# Patient Record
Sex: Male | Born: 1960
Health system: Southern US, Community
[De-identification: ages and names within clinical notes are randomized; demographics above are authoritative.]

## PROBLEM LIST (undated history)

## (undated) DIAGNOSIS — N200 Calculus of kidney: Secondary | ICD-10-CM

## (undated) DIAGNOSIS — I10 Essential (primary) hypertension: Secondary | ICD-10-CM

## (undated) DIAGNOSIS — E785 Hyperlipidemia, unspecified: Secondary | ICD-10-CM

## (undated) DIAGNOSIS — G4730 Sleep apnea, unspecified: Secondary | ICD-10-CM

## (undated) DIAGNOSIS — I4891 Unspecified atrial fibrillation: Secondary | ICD-10-CM

## (undated) HISTORY — DX: Hyperlipidemia, unspecified: E78.5

## (undated) HISTORY — DX: Essential (primary) hypertension: I10

## (undated) HISTORY — PX: COLONOSCOPY: SHX174

## (undated) HISTORY — PX: CARDIOVERSION: SHX1299

## (undated) HISTORY — DX: Sleep apnea, unspecified: G47.30

## (undated) HISTORY — DX: Unspecified atrial fibrillation: I48.91

## (undated) HISTORY — DX: Calculus of kidney: N20.0

---

## 1996-02-26 ENCOUNTER — Encounter: Payer: Self-pay | Admitting: Internal Medicine

## 1996-03-03 DIAGNOSIS — I10 Essential (primary) hypertension: Secondary | ICD-10-CM

## 1996-03-03 HISTORY — DX: Essential (primary) hypertension: I10

## 1997-06-11 ENCOUNTER — Encounter: Payer: Self-pay | Admitting: Internal Medicine

## 2005-11-03 ENCOUNTER — Ambulatory Visit: Payer: Self-pay | Admitting: Internal Medicine

## 2007-12-31 ENCOUNTER — Ambulatory Visit: Payer: Self-pay | Admitting: Family Medicine

## 2007-12-31 DIAGNOSIS — E7800 Pure hypercholesterolemia, unspecified: Secondary | ICD-10-CM | POA: Insufficient documentation

## 2007-12-31 DIAGNOSIS — I10 Essential (primary) hypertension: Secondary | ICD-10-CM | POA: Insufficient documentation

## 2008-01-01 LAB — CONVERTED CEMR LAB
BUN: 14 mg/dL (ref 6–23)
Cholesterol: 196 mg/dL (ref 0–200)
Creatinine, Ser: 1.1 mg/dL (ref 0.4–1.5)
GFR calc Af Amer: 93 mL/min
GFR calc non Af Amer: 77 mL/min
Glucose, Bld: 88 mg/dL (ref 70–99)
LDL Cholesterol: 139 mg/dL — ABNORMAL HIGH (ref 0–99)
Triglycerides: 89 mg/dL (ref 0–149)
VLDL: 18 mg/dL (ref 0–40)

## 2008-03-13 ENCOUNTER — Ambulatory Visit: Payer: Self-pay | Admitting: Internal Medicine

## 2008-05-19 ENCOUNTER — Encounter: Payer: Self-pay | Admitting: Internal Medicine

## 2008-09-16 ENCOUNTER — Telehealth: Payer: Self-pay | Admitting: Internal Medicine

## 2008-10-07 ENCOUNTER — Ambulatory Visit: Payer: Self-pay | Admitting: Internal Medicine

## 2009-01-22 ENCOUNTER — Ambulatory Visit: Payer: Self-pay | Admitting: Internal Medicine

## 2009-01-22 DIAGNOSIS — I4891 Unspecified atrial fibrillation: Secondary | ICD-10-CM

## 2009-01-22 DIAGNOSIS — I480 Paroxysmal atrial fibrillation: Secondary | ICD-10-CM | POA: Insufficient documentation

## 2009-02-02 ENCOUNTER — Ambulatory Visit: Payer: Self-pay

## 2009-02-02 ENCOUNTER — Encounter: Payer: Self-pay | Admitting: Internal Medicine

## 2009-02-08 ENCOUNTER — Encounter (INDEPENDENT_AMBULATORY_CARE_PROVIDER_SITE_OTHER): Payer: Self-pay | Admitting: *Deleted

## 2009-02-08 ENCOUNTER — Ambulatory Visit: Payer: Self-pay | Admitting: Internal Medicine

## 2009-02-11 ENCOUNTER — Ambulatory Visit: Payer: Self-pay | Admitting: Cardiovascular Disease

## 2009-02-15 ENCOUNTER — Ambulatory Visit: Payer: Self-pay | Admitting: Internal Medicine

## 2009-02-17 ENCOUNTER — Ambulatory Visit: Payer: Self-pay | Admitting: Internal Medicine

## 2009-02-17 ENCOUNTER — Encounter: Payer: Self-pay | Admitting: Internal Medicine

## 2009-02-17 ENCOUNTER — Ambulatory Visit (HOSPITAL_COMMUNITY): Admission: RE | Admit: 2009-02-17 | Discharge: 2009-02-17 | Payer: Self-pay | Admitting: Internal Medicine

## 2009-02-19 ENCOUNTER — Ambulatory Visit: Payer: Self-pay | Admitting: Internal Medicine

## 2009-02-19 ENCOUNTER — Encounter: Payer: Self-pay | Admitting: Internal Medicine

## 2009-02-26 ENCOUNTER — Ambulatory Visit: Payer: Self-pay | Admitting: Internal Medicine

## 2009-03-05 ENCOUNTER — Ambulatory Visit: Payer: Self-pay | Admitting: Cardiovascular Disease

## 2009-03-05 LAB — CONVERTED CEMR LAB
POC INR: 2.3
Prothrombin Time: 18.7 s

## 2009-03-11 ENCOUNTER — Ambulatory Visit: Payer: Self-pay | Admitting: Internal Medicine

## 2009-03-11 LAB — CONVERTED CEMR LAB: POC INR: 2.4

## 2009-03-12 ENCOUNTER — Encounter: Payer: Self-pay | Admitting: Internal Medicine

## 2009-03-16 ENCOUNTER — Telehealth: Payer: Self-pay | Admitting: Internal Medicine

## 2009-03-18 ENCOUNTER — Ambulatory Visit: Payer: Self-pay | Admitting: Cardiology

## 2009-03-18 LAB — CONVERTED CEMR LAB: POC INR: 2.8

## 2009-03-19 ENCOUNTER — Ambulatory Visit (HOSPITAL_COMMUNITY): Admission: RE | Admit: 2009-03-19 | Discharge: 2009-03-19 | Payer: Self-pay | Admitting: Internal Medicine

## 2009-03-19 ENCOUNTER — Telehealth: Payer: Self-pay | Admitting: Internal Medicine

## 2009-04-24 ENCOUNTER — Encounter: Payer: Self-pay | Admitting: Internal Medicine

## 2009-04-27 ENCOUNTER — Ambulatory Visit: Payer: Self-pay

## 2009-04-27 ENCOUNTER — Encounter: Payer: Self-pay | Admitting: Internal Medicine

## 2009-05-03 ENCOUNTER — Ambulatory Visit: Payer: Self-pay | Admitting: Internal Medicine

## 2009-05-19 ENCOUNTER — Telehealth (INDEPENDENT_AMBULATORY_CARE_PROVIDER_SITE_OTHER): Payer: Self-pay | Admitting: *Deleted

## 2009-06-03 ENCOUNTER — Telehealth: Payer: Self-pay | Admitting: Internal Medicine

## 2009-06-23 ENCOUNTER — Ambulatory Visit: Payer: Self-pay | Admitting: Internal Medicine

## 2009-06-24 LAB — CONVERTED CEMR LAB
Calcium: 9.6 mg/dL (ref 8.4–10.5)
Sodium: 140 meq/L (ref 135–145)

## 2009-08-12 ENCOUNTER — Telehealth: Payer: Self-pay | Admitting: Internal Medicine

## 2009-10-20 ENCOUNTER — Telehealth: Payer: Self-pay | Admitting: Internal Medicine

## 2009-12-24 ENCOUNTER — Telehealth: Payer: Self-pay | Admitting: Internal Medicine

## 2010-01-07 ENCOUNTER — Ambulatory Visit: Payer: Self-pay | Admitting: Internal Medicine

## 2010-01-07 DIAGNOSIS — R609 Edema, unspecified: Secondary | ICD-10-CM | POA: Insufficient documentation

## 2010-07-31 LAB — CONVERTED CEMR LAB
ALT: 22 units/L (ref 0–53)
Basophils Absolute: 0 10*3/uL (ref 0.0–0.1)
Basophils Relative: 0 % (ref 0–1)
CO2: 21 meq/L (ref 19–32)
Calcium: 9.4 mg/dL (ref 8.4–10.5)
Chloride: 107 meq/L (ref 96–112)
Eosinophils Relative: 1 % (ref 0–5)
HCT: 45.3 % (ref 39.0–52.0)
Lymphocytes Relative: 31 % (ref 12–46)
MCHC: 34.9 g/dL (ref 30.0–36.0)
Platelets: 177 10*3/uL (ref 150–400)
RDW: 12.4 % (ref 11.5–15.5)
Sodium: 143 meq/L (ref 135–145)
TSH: 3.167 microintl units/mL (ref 0.350–4.500)
Total Protein: 7 g/dL (ref 6.0–8.3)

## 2010-08-03 NOTE — Progress Notes (Signed)
Summary: REFILL Triamterene-HCTZ and Benazepril    Phone Note Refill Request   Refills Requested: Medication #1:  TRIAMTERENE-HCTZ 37.5-25 MG  TABS take 1 tablet by mouth once daily  Medication #2:  BENAZEPRIL HCL 10 MG TABS 1 by mouth once daily. [facility] (PT NEEDS A 90 DAY SUPPLY FOR INS PURPOSES)  Initial call taken by: [place] Carbo,  December 24, 2009 1:51 PM    Prescriptions: BENAZEPRIL HCL 10 MG TABS (BENAZEPRIL HCL) 1 by mouth once daily  #90 x 3   Entered by:   Bishop Dublin, CMA   Authorized by:   Nathen May, MD, [facility]   Signed by:   Bishop Dublin, CMA on 12/24/2009   Method used:   Electronically to        [facility] [phone]* (retail)       [address]       [place], [place]  [address]       Ph: [phone]       Fax: [phone]   RxID:   [id number] TRIAMTERENE-HCTZ 37.5-25 MG  TABS (TRIAMTERENE-HCTZ) take 1 tablet by mouth once daily  #90 x 3   Entered by:   Bishop Dublin, CMA   Authorized by:   Nathen May, MD, [facility]   Signed by:   Bishop Dublin, CMA on 12/24/2009   Method used:   Electronically to        [facility] [phone]* (retail)       [address]       [place], [place]  [address]       Ph: [phone]       Fax: [phone]   RxID:   [id number]

## 2010-08-03 NOTE — Progress Notes (Signed)
Summary: please advise   Phone Note Call from Patient   Caller: Patient Call For: Dr Graciela Husbands Summary of Call: Elvina Sidle Dr Graciela Husbands, Mr Austad called about his [employer]. He just wanted to check and see if your last office note would verify that his issue is ok. He just wanted to check with you before he sent it in. Please advise. Thanks Cala Bradford :) Initial call taken by: Mercer Pod,  August 12, 2009 3:39 PM  Follow-up for Phone Call        spoke with Graciela Husbands, he thinks the last office note should be good enough for her life insurance.    02/15 left message with daughter to call back with this message.Marland Kitchen  02/15 will fax last office note to pt at his office. He will let us know if he needs anything else.  Follow-up by: Mercer Pod,  August 17, 2009 11:47 AM

## 2010-08-03 NOTE — Assessment & Plan Note (Signed)
Summary: F/U 6 MONTHS      Allergies Added:   Visit Type:  Follow-up Primary Provider:  Cindee Salt MD  CC:  no complaints.  History of Present Illness: Jose Garcia is seen in followup for atrial fibrillation-paroxysmal for which he was cardioverted. He also has a history of bradycardia. A transient cardiomyopathy was identified by ultrasound with subsequent normalization by echo last fall  his blood pressures at home run 130 over the 90s;  his diet is quite replete with  sodium.  Current Medications (verified): 1)  Triamterene-Hctz 37.5-25 Mg  Tabs (Triamterene-Hctz) .... Take 1 Tablet By Mouth Once Daily 2)  Aspirin 81 Mg Tbec (Aspirin) .... Take 2 Tablets By Mouth Daily 3)  Benazepril Hcl 10 Mg Tabs (Benazepril Hcl) .Marland Kitchen.. 1 By Mouth Once Daily  Allergies (verified): 1)  ! * Shellfish  Past History:  Past Medical History: Last updated: 05/03/2009 Hypertension (03/03/1996) Hyperlipidemia Atrial fibrillation Cardiomyopathy-resolved 05/03/09  Past Surgical History: Last updated: 10/07/2008 Denies surgical history  Vital Signs:  Patient profile:   50 year old male Height:      72 inches Weight:      278 pounds BMI:     37.84 Pulse rate:   91 / minute BP sitting:   156 / 93  (left arm) Cuff size:   regular  Vitals Entered By: Hardin Negus, RMA (January 07, 2010 2:08 PM)  Physical Exam  General:  The patient was alert and oriented in no acute distress. HEENT Normal.  Neck veins were flat, carotids were brisk.  Lungs were clear.  Heart sounds were regular without murmurs or gallops.  Abdomen was soft with active bowel sounds. There is no clubbing cyanosis ; trace bilateral edema Skin Warm and dry engaging affect    Impression & Recommendations:  Problem # 1:  ATRIAL FIBRILLATION (ICD-427.31)  atrial fibrillation is paroxysmal. He had no clinical recurrences. We'll continue him on his aspirin with his CHADS score of 1/CHADS VASC score of one. His updated  medication list for this problem includes:    Aspirin 81 Mg Tbec (Aspirin) .Marland Kitchen... Take 2 tablets by mouth daily  His updated medication list for this problem includes:    Aspirin 81 Mg Tbec (Aspirin) .Marland Kitchen... Take 2 tablets by mouth daily  Problem # 2:  HYPERTENSION (ICD-401.9)  Is on double therapy including a diuretic; check his metabolic profile today.  we have also discussed the importance of sodium and the need to try to reduce his intake. We discussed the use of salt substitutes and potential benefits of potassium related to blood pressure His updated medication list for this problem includes:    Triamterene-hctz 37.5-25 Mg Tabs (Triamterene-hctz) .Marland Kitchen... Take 1 tablet by mouth once daily    Aspirin 81 Mg Tbec (Aspirin) .Marland Kitchen... Take 2 tablets by mouth daily    Benazepril Hcl 10 Mg Tabs (Benazepril hcl) .Marland Kitchen... 1 by mouth once daily  His updated medication list for this problem includes:    Triamterene-hctz 37.5-25 Mg Tabs (Triamterene-hctz) .Marland Kitchen... Take 1 tablet by mouth once daily    Aspirin 81 Mg Tbec (Aspirin) .Marland Kitchen... Take 2 tablets by mouth daily    Benazepril Hcl 10 Mg Tabs (Benazepril hcl) .Marland Kitchen... 1 by mouth once daily  Problem # 3:  SINUS BRADYCARDIA (ICD-427.81)  His heart rate is surprisingly rapid today. Will check a thyroid and hemoglobin given the relative sinus "tachycardia". His updated medication list for this problem includes:    Aspirin 81 Mg Tbec (Aspirin) .Marland KitchenMarland KitchenMarland KitchenMarland Kitchen  Take 2 tablets by mouth daily    Benazepril Hcl 10 Mg Tabs (Benazepril hcl) .Marland Kitchen... 1 by mouth once daily  His updated medication list for this problem includes:    Aspirin 81 Mg Tbec (Aspirin) .Marland Kitchen... Take 2 tablets by mouth daily    Benazepril Hcl 10 Mg Tabs (Benazepril hcl) .Marland Kitchen... 1 by mouth once daily  Other Orders: T-Basic Metabolic Panel [phone]) T-TSH [phone]) T-CBC w/Diff [phone])  Patient Instructions: 1)  Your physician recommends that you schedule a follow-up appointment in: 12 months 2)   We'll check metabolic profile TSH and CBC. 3)  He is advised to use a salt substitute

## 2010-08-03 NOTE — Progress Notes (Signed)
Summary: RX   Phone Note Refill Request Call back at Home Phone [phone] Message from:  SELF on October 20, 2009 4:30 PM  Refills Requested: Medication #1:  TRIAMTERENE-HCTZ 37.5-25 MG  TABS take 1 tablet by mouth once daily [facility]  Initial call taken by: Harlon Flor,  October 20, 2009 4:30 PM Caller: SELF    Prescriptions: TRIAMTERENE-HCTZ 37.5-25 MG  TABS (TRIAMTERENE-HCTZ) take 1 tablet by mouth once daily  #90 x 3   Entered by:   Mercer Pod   Authorized by:   Nathen May, MD, [facility]   Signed by:   Mercer Pod on 10/21/2009   Method used:   [facility]ically [facility]        [facility]  [address] [phone][facility] (retail)       [address]       [place], [place]  [address]       Ph: [phone]       Fax: [phone]   RxID:   [id number]

## 2010-10-07 LAB — BASIC METABOLIC PANEL
BUN: 15 mg/dL (ref 6–23)
CO2: 27 mEq/L (ref 19–32)
Calcium: 8.5 mg/dL (ref 8.4–10.5)
Chloride: 106 mEq/L (ref 96–112)
Creatinine, Ser: 1.01 mg/dL (ref 0.4–1.5)
GFR calc Af Amer: >60 mL/min (ref >60)
GFR calc non Af Amer: >60 mL/min (ref >60)
Glucose, Bld: 116 mg/dL — ABNORMAL HIGH (ref 70–99)
Potassium: 4.3 mEq/L (ref 3.5–5.1)
Sodium: 139 mEq/L (ref 135–145)

## 2010-10-07 LAB — PROTIME-INR: Prothrombin Time: 23.6 seconds — ABNORMAL HIGH (ref 11.6–15.2)

## 2010-10-07 LAB — CBC
HCT: 42.4 % (ref 39.0–52.0)
Platelets: 120 10*3/uL — ABNORMAL LOW (ref 150–400)
RBC: 4.55 MIL/uL (ref 4.22–5.81)
WBC: 4.4 10*3/uL (ref 4.0–10.5)

## 2010-10-08 LAB — PROTIME-INR: Prothrombin Time: 19.9 seconds — ABNORMAL HIGH (ref 11.6–15.2)

## 2010-12-24 ENCOUNTER — Other Ambulatory Visit: Payer: Self-pay | Admitting: Internal Medicine

## 2011-01-19 ENCOUNTER — Encounter: Payer: Self-pay | Admitting: Internal Medicine

## 2011-12-12 ENCOUNTER — Other Ambulatory Visit: Payer: Self-pay | Admitting: *Deleted

## 2011-12-12 MED ORDER — TRIAMTERENE-HCTZ 37.5-25 MG PO TABS: 1.0000 | ORAL_TABLET | Freq: Every day | ORAL | Status: DC

## 2011-12-12 MED ORDER — BENAZEPRIL HCL 10 MG PO TABS: 10.0000 mg | ORAL_TABLET | Freq: Every day | ORAL | Status: DC

## 2012-02-23 ENCOUNTER — Ambulatory Visit: Payer: Self-pay | Admitting: Gastroenterology

## 2013-08-05 ENCOUNTER — Telehealth: Payer: Self-pay

## 2013-08-05 ENCOUNTER — Encounter: Payer: Self-pay | Admitting: Internal Medicine

## 2013-08-05 ENCOUNTER — Ambulatory Visit (INDEPENDENT_AMBULATORY_CARE_PROVIDER_SITE_OTHER): Payer: BC Managed Care – PPO | Admitting: Internal Medicine

## 2013-08-05 VITALS — BP 128/80 | HR 170 | Ht 72.0 in | Wt 295.5 lb

## 2013-08-05 DIAGNOSIS — R000 Tachycardia, unspecified: Secondary | ICD-10-CM

## 2013-08-05 DIAGNOSIS — I4891 Unspecified atrial fibrillation: Secondary | ICD-10-CM

## 2013-08-05 MED ORDER — APIXABAN 5 MG PO TABS: 5.0000 mg | ORAL_TABLET | Freq: Two times a day (BID) | ORAL | Status: DC

## 2013-08-05 MED ORDER — METOPROLOL SUCCINATE ER 50 MG PO TB24: 50.0000 mg | ORAL_TABLET | Freq: Every day | ORAL | Status: DC

## 2013-08-05 NOTE — Progress Notes (Signed)
      Patient Care Team: Fidel LevyJr. [name], MD as PCP - General (Family Medicine)   HPI  Jose MantisDavid B Garcia is a 53 y.o. male Is seen as an add-on today. He was last seen 3-1/2 years ago He has a history of paroxysmal atrial fibrillation associated with a transient cardiomyopathy.he has a history of treated hypertension; he takes aspirin with a CHADS-VASc score of one  He comes in today with palpitations which she became aware of last evening but is not sure that's when he started  The patient denies chest pain, shortness of breath, nocturnal dyspnea, orthopnea or peripheral edema.  There have been no palpitations, lightheadedness or syncope.     Past Medical History  Diagnosis Date  . Hypertension 03/03/96  . Hyperlipidemia   . Atrial fibrillation   . Cardiomyopathy     Resolved 05/03/09    History reviewed. No pertinent past surgical history.  Current Outpatient Prescriptions  Medication Sig Dispense Refill  . aspirin 81 MG EC tablet Take 81 mg by mouth daily.       . benazepril (LOTENSIN) 20 MG tablet Take 20 mg by mouth daily.      . Cranberry-Vitamin C-Probiotic (AZO CRANBERRY) 250-30 MG TABS Take by mouth daily.      . Omega-3 Fatty Acids (FISH OIL) 1000 MG CAPS Take by mouth daily.      Marland Kitchen. triamterene-hydrochlorothiazide (MAXZIDE-25) 37.5-25 MG per tablet Take 1 each (1 tablet total) by mouth daily.  90 tablet  1   No current facility-administered medications for this visit.    No Known Allergies  Review of Systems negative except from HPI and PMH  Physical Exam BP 128/80  Pulse 170  Ht 6' (1.829 m)  Wt 295 lb 8 oz (134.038 kg)  BMI 40.07 kg/m2 Well developed and well nourished in no acute distress HENT normal E scleral and icterus clear Neck Supple JVP flat; carotids brisk and full Clear to ausculation  Rapid and irregular rate and rhythm with no murmurs Soft with active bowel sounds No clubbing cyanosis 1+ Edema Alert and oriented, grossly normal motor and  sensory function Skin Warm and Dry  ECG demonstrates atrial fibrillation with a rapid ventricular response at 150 beats per minute  Assessment and  Plan

## 2013-08-05 NOTE — Telephone Encounter (Signed)
Pt called and states he is having a "racing and eratic" heartbeat. Would like for someone to call him.

## 2013-08-05 NOTE — Assessment & Plan Note (Signed)
The patient has recurrent atrial fibrillation of presumed but not clearly a brief duration. Because of that, we'll need to anticoagulate and undertake TEE guided cardioversion. We'll begin him on rate control with metoprolol 50 mg a day. We will continue him on apixaban for 4 weeks following as his CHADS-VASc score is only 1 enhances not need long-term anticoagulation. We will hold his aspirin the interim.  Given his history of previously identified tachycardia-induced cardiomyopathy with subsequent resolution,t

## 2013-08-05 NOTE — Telephone Encounter (Signed)
Last night patient felt like he had a flutter. Heart is racing and rate is up and down. He is not sure if the machine is correct but it says his rate is 150. He says he feels fine other than being nervous that he is in afib again. He will check manually when he gets to work (he was in the car almost at work when he called). I informed patient that I would discuss this with Dr. Graciela HusbandsKlein as soon as he arrived and call him back for a possible visit today. Patient instructed to notify EMS if he felt that it became emergent or became symptomatic.

## 2013-08-05 NOTE — Addendum Note (Signed)
Addended by: Fransico SettersBAUCOM, Raphael Fitzpatrick E on: 08/05/2013 01:51 PM   Modules accepted: Orders

## 2013-08-05 NOTE — Telephone Encounter (Signed)
Appt made with Dr. Graciela HusbandsKlein for today

## 2013-08-05 NOTE — Patient Instructions (Addendum)
Your physician has requested that you have a TEE Friday 07/08/13 at 06:30 am . During a TEE, sound waves are used to create images of your heart. It provides your doctor with information about the size and shape of your heart and how well your heart's chambers and valves are working. In this test, a transducer is attached to the end of a flexible tube that's guided down your throat and into your esophagus (the tube leading from you mouth to your stomach) to get a more detailed image of your heart. You are not awake for the procedure. Please see the instruction sheet given to you today. For further information please visit [email or web address].   Your physician has recommended you make the following change in your medication:  Start Eliquis 5 mg twice a day Start Metoprolol 50 mg daily

## 2013-08-06 LAB — CBC WITH DIFFERENTIAL
BASOS ABS: 0 10*3/uL (ref 0.0–0.2)
Basos: 0 %
EOS: 1 %
Eosinophils Absolute: 0 10*3/uL (ref 0.0–0.4)
HCT: 48 % (ref 37.5–51.0)
Hemoglobin: 16.4 g/dL (ref 12.6–17.7)
IMMATURE GRANS (ABS): 0 10*3/uL (ref 0.0–0.1)
IMMATURE GRANULOCYTES: 0 %
LYMPHS: 23 %
Lymphocytes Absolute: 1.6 10*3/uL (ref 0.7–3.1)
MCH: 31.5 pg (ref 26.6–33.0)
MCHC: 34.2 g/dL (ref 31.5–35.7)
MCV: 92 fL (ref 79–97)
MONOCYTES: 7 %
Monocytes Absolute: 0.5 10*3/uL (ref 0.1–0.9)
Neutrophils Absolute: 4.8 10*3/uL (ref 1.4–7.0)
Neutrophils Relative %: 69 %
PLATELETS: 240 10*3/uL (ref 150–379)
RBC: 5.2 x10E6/uL (ref 4.14–5.80)
RDW: 13.2 % (ref 12.3–15.4)
WBC: 6.9 10*3/uL (ref 3.4–10.8)

## 2013-08-06 LAB — PROTIME-INR
INR: 1 (ref 0.8–1.2)
PROTHROMBIN TIME: 10.6 s (ref 9.1–12.0)

## 2013-08-06 LAB — BASIC METABOLIC PANEL
BUN/Creatinine Ratio: 15 (ref 9–20)
BUN: 13 mg/dL (ref 6–24)
CALCIUM: 9.3 mg/dL (ref 8.7–10.2)
CHLORIDE: 103 mmol/L (ref 97–108)
CO2: 19 mmol/L (ref 18–29)
Creatinine, Ser: 0.86 mg/dL (ref 0.76–1.27)
GFR calc Af Amer: 115 mL/min/{1.73_m2} (ref >59)
GFR, EST NON AFRICAN AMERICAN: 100 mL/min/{1.73_m2} (ref >59)
GLUCOSE: 119 mg/dL — AB (ref 65–99)
POTASSIUM: 4.2 mmol/L (ref 3.5–5.2)
Sodium: 142 mmol/L (ref 134–144)

## 2013-08-08 ENCOUNTER — Encounter: Payer: Self-pay | Admitting: Internal Medicine

## 2013-08-08 ENCOUNTER — Ambulatory Visit: Payer: Self-pay | Admitting: Internal Medicine

## 2013-08-08 DIAGNOSIS — I4891 Unspecified atrial fibrillation: Secondary | ICD-10-CM

## 2013-08-08 DIAGNOSIS — I059 Rheumatic mitral valve disease, unspecified: Secondary | ICD-10-CM

## 2013-08-20 ENCOUNTER — Encounter: Payer: BC Managed Care – PPO | Admitting: Internal Medicine

## 2013-08-29 ENCOUNTER — Telehealth: Payer: Self-pay

## 2013-08-29 NOTE — Telephone Encounter (Signed)
Pt called and states he has a question about his medication. Please call.

## 2013-08-29 NOTE — Telephone Encounter (Signed)
Per Dr Graciela HusbandsKlein have patient come in at 0800 on Tuesday 11/01/13. Patient verbalized understanding.

## 2013-08-29 NOTE — Telephone Encounter (Signed)
Patient states his cardioversion follow up appt got pushed back because of the bad weather. He wants to make sure he is still supposed to be on Metoprolol and did not want to wait until April to find out he could have stopped it. He also wanted to know if there was a more affordable medication than Eliquis or if he could stop it all together. Dr. Kirke CorinArida did his cardioversion. Patient said he would like to see Dr. Kirke CorinArida to discuss these options if he has an opening before Dr. Graciela HusbandsKlein if this can not be managed over the phone.

## 2013-09-02 ENCOUNTER — Ambulatory Visit (INDEPENDENT_AMBULATORY_CARE_PROVIDER_SITE_OTHER): Payer: BC Managed Care – PPO | Admitting: Internal Medicine

## 2013-09-02 ENCOUNTER — Encounter: Payer: Self-pay | Admitting: Internal Medicine

## 2013-09-02 VITALS — BP 142/83 | HR 56 | Ht 72.0 in | Wt 296.8 lb

## 2013-09-02 DIAGNOSIS — I4891 Unspecified atrial fibrillation: Secondary | ICD-10-CM

## 2013-09-02 NOTE — Progress Notes (Signed)
      Patient Care Team: Fidel LevyJr. [name], MD as PCP - General (Family Medicine)   HPI  Bari MantisDavid B Better is a 53 y.o. male Is seen as an add-on today. He was last seen 3-1/2 years ago He has a history of paroxysmal atrial fibrillation associated with a transient cardiomyopathy.he has a history of treated hypertension; he takes aspirin with a CHADS-VASc score of one  He came in early Feb with AFib >>TEEDCCV   He is much improved.  Past Medical History  Diagnosis Date  . Hypertension 03/03/96  . Hyperlipidemia   . Atrial fibrillation   . Cardiomyopathy     Resolved 05/03/09    Past Surgical History  Procedure Laterality Date  . Cardioversion      Current Outpatient Prescriptions  Medication Sig Dispense Refill  . apixaban (ELIQUIS) 5 MG TABS tablet Take 1 tablet (5 mg total) by mouth 2 (two) times daily.  60 tablet  3  . benazepril (LOTENSIN) 20 MG tablet Take 20 mg by mouth daily.      . Cranberry-Vitamin C-Probiotic (AZO CRANBERRY) 250-30 MG TABS Take by mouth daily.      . metoprolol succinate (TOPROL-XL) 50 MG 24 hr tablet Take 1 tablet (50 mg total) by mouth daily. Take with or immediately following a meal.  90 tablet  3  . Omega-3 Fatty Acids (FISH OIL) 1000 MG CAPS Take by mouth daily.      Marland Kitchen. triamterene-hydrochlorothiazide (MAXZIDE-25) 37.5-25 MG per tablet Take 1 each (1 tablet total) by mouth daily.  90 tablet  1   No current facility-administered medications for this visit.    Allergies  Allergen Reactions  . Shellfish Allergy     Review of Systems negative except from HPI and PMH  Physical Exam BP 142/83  Pulse 56  Ht 6' (1.829 m)  Wt 296 lb 12 oz (134.605 kg)  BMI 40.24 kg/m2 Well developed and nourished in no acute distress HENT normal Neck supple with JVP-flat Clear Regular rate and rhythm, no murmurs or gallops Abd-soft with active BS No Clubbing cyanosis edema Skin-warm and dry A & Oriented  Grossly normal sensory and motor  function    Assessment and  Plan Atrial fibrillation  Hypertension  He has a CHADS-VASc score of 1. Based on recent studies from your, we will discontinue his apixaban. He will take no aspirin. In the event that he has recurrent atrial fibrillation which I hope you will be able to detect by takingt his pulse will resume anticoagulation. The frequency of recurrence we'll informed decisions regarding alternative therapies

## 2013-09-02 NOTE — Patient Instructions (Addendum)
Your physician has recommended you make the following change in your medication:  Stop your Eliquis   Your physician wants you to follow-up in: 6 months. You will receive a reminder letter in the mail two months in advance. If you don't receive a letter, please call our office to schedule the follow-up appointment.

## 2013-10-02 ENCOUNTER — Encounter: Payer: BC Managed Care – PPO | Admitting: Internal Medicine

## 2014-03-03 ENCOUNTER — Encounter: Payer: BC Managed Care – PPO | Admitting: Internal Medicine

## 2014-03-31 ENCOUNTER — Encounter: Payer: Self-pay | Admitting: Internal Medicine

## 2014-03-31 ENCOUNTER — Ambulatory Visit (INDEPENDENT_AMBULATORY_CARE_PROVIDER_SITE_OTHER): Payer: BC Managed Care – PPO | Admitting: Internal Medicine

## 2014-03-31 VITALS — BP 145/89 | HR 57 | Ht 72.0 in | Wt 298.2 lb

## 2014-03-31 DIAGNOSIS — I4891 Unspecified atrial fibrillation: Secondary | ICD-10-CM

## 2014-03-31 MED ORDER — METOPROLOL SUCCINATE ER 50 MG PO TB24: 50.0000 mg | ORAL_TABLET | Freq: Every day | ORAL | Status: DC

## 2014-03-31 NOTE — Patient Instructions (Signed)
Your physician recommends that you have labs today:  BMP  Your physician wants you to follow-up in: 1 year with Dr. Graciela HusbandsKlein. You will receive a reminder letter in the mail two months in advance. If you don't receive a letter, please call our office to schedule the follow-up appointment.  Your Metoprolol has been refilled   .

## 2014-03-31 NOTE — Progress Notes (Signed)
      Patient Care Team: Jose LevyJr. [name], MD as PCP - General (Family Medicine)   HPI  Bari MantisDavid B Gargis is a 53 y.o. male Is seen as an add-on today. He was last seen 3-1/2 years ago He has a history of paroxysmal atrial fibrillation associated with a transient cardiomyopathy.he has a history of treated hypertension; he takes aspirin with a CHADS-VASc score of one  He came in early Feb with AFib >>TEEDCCV    His CHADS-VASc score is 1. He is currently not anticoagulated.  Last ejection fraction TEE 2/15 50-55% He is much improved.  Past Medical History  Diagnosis Date  . Hypertension 03/03/96  . Hyperlipidemia   . Atrial fibrillation   . Cardiomyopathy     Resolved 05/03/09    Past Surgical History  Procedure Laterality Date  . Cardioversion      Current Outpatient Prescriptions  Medication Sig Dispense Refill  . benazepril (LOTENSIN) 20 MG tablet Take 20 mg by mouth daily.      . Cranberry-Vitamin C-Probiotic (AZO CRANBERRY) 250-30 MG TABS Take by mouth daily.      . metoprolol succinate (TOPROL-XL) 50 MG 24 hr tablet Take 1 tablet (50 mg total) by mouth daily. Take with or immediately following a meal.  90 tablet  3  . Omega-3 Fatty Acids (FISH OIL) 1000 MG CAPS Take by mouth daily.      Marland Kitchen. triamterene-hydrochlorothiazide (MAXZIDE-25) 37.5-25 MG per tablet Take 1 each (1 tablet total) by mouth daily.  90 tablet  1   No current facility-administered medications for this visit.    Allergies  Allergen Reactions  . Shellfish Allergy     Review of Systems negative except from HPI and PMH  Physical Exam BP 145/89  Pulse 57  Ht 6' (1.829 m)  Wt 298 lb 4 oz (135.285 kg)  BMI 40.44 kg/m2 Well developed and nourished in no acute distress HENT normal Neck supple with JVP-flat Clear Regular rate and rhythm, no murmurs or gallops Abd-soft with active BS No Clubbing cyanosis edema Skin-warm and dry A & Oriented  Grossly normal sensory and motor function    Assessment  and  Plan Atrial fibrillation  Hypertension  Obesity  He has a CHADS-VASc score of 1.  He is not on anticoagulation. His blood pressure is mildly elevated today. I encouraged weight loss and exercise the blood pressure target of 120. We'll check his metabolic profile today on the recently introduced non potassium sparing diuretic  We will see in one year   We spent more than 50% of our >25 min visit in face to face counseling regarding the above

## 2014-04-01 LAB — BASIC METABOLIC PANEL
BUN/Creatinine Ratio: 15 (ref 9–20)
BUN: 15 mg/dL (ref 6–24)
CALCIUM: 9.3 mg/dL (ref 8.7–10.2)
CHLORIDE: 97 mmol/L (ref 97–108)
CO2: 27 mmol/L (ref 18–29)
Creatinine, Ser: 0.97 mg/dL (ref 0.76–1.27)
GFR calc Af Amer: 103 mL/min/{1.73_m2} (ref >59)
GFR calc non Af Amer: 89 mL/min/{1.73_m2} (ref >59)
Glucose: 123 mg/dL — ABNORMAL HIGH (ref 65–99)
POTASSIUM: 4.1 mmol/L (ref 3.5–5.2)
SODIUM: 139 mmol/L (ref 134–144)

## 2014-05-19 ENCOUNTER — Telehealth: Payer: Self-pay

## 2014-05-19 ENCOUNTER — Encounter: Payer: Self-pay | Admitting: Internal Medicine

## 2014-05-19 ENCOUNTER — Ambulatory Visit (INDEPENDENT_AMBULATORY_CARE_PROVIDER_SITE_OTHER): Payer: BC Managed Care – PPO | Admitting: Internal Medicine

## 2014-05-19 VITALS — BP 120/80 | HR 134 | Ht 72.0 in | Wt 297.2 lb

## 2014-05-19 DIAGNOSIS — I4891 Unspecified atrial fibrillation: Secondary | ICD-10-CM

## 2014-05-19 NOTE — Telephone Encounter (Signed)
Pt states he is in afib, states it started last night, states he is in afib now, states he did take Eliquis this morning. Please call.

## 2014-05-19 NOTE — Patient Instructions (Signed)
[facility] the [facility] have [facility]. You will be given Flecainide 300 mg x 1 dose to see if you convert back to normal rhythm on your own. If not, there will be plans to cardiovert you.  Please do not eat or drink anything after midnight tonight, but you may take your medications in the morning with enough water to get them down safely.

## 2014-05-19 NOTE — Telephone Encounter (Signed)
Patient states he is in Afib  Heart rate between 100-120 He "does not feel well and is very tired" Patient scheduled to see Dr. Graciela HusbandsKlein today  Patient advised to contact EMS if he feels his situation becomes emergent before then

## 2014-05-19 NOTE — Progress Notes (Signed)
      Patient Care Team: Fidel LevyJr. [name], MD as PCP - General (Family Medicine)   HPI  Jose MantisDavid B Garcia is a 53 y.o. male Is seen as an add-on today. He has a history of paroxysmal atrial fibrillation associated with a transient cardiomyopathy. he has a history of treated hypertension; he takes aspirin with a CHADS-VASc score of one  He came in early Feb with AFib >>TEEDCCV    He comes in this evening having gone into atrial fibrillation yesterday (11/16) at 9:00 following a paroxysm of coughing.  Last ejection fraction TEE 2/15 50-55%    Past Medical History  Diagnosis Date  . Hypertension 03/03/96  . Hyperlipidemia   . Atrial fibrillation   . Cardiomyopathy     Resolved 05/03/09    Past Surgical History  Procedure Laterality Date  . Cardioversion      Current Outpatient Prescriptions  Medication Sig Dispense Refill  . benazepril (LOTENSIN) 20 MG tablet Take 20 mg by mouth daily.    . chlorthalidone (HYGROTON) 25 MG tablet Take 25 mg by mouth daily.    . Cranberry-Vitamin C-Probiotic (AZO CRANBERRY) 250-30 MG TABS Take by mouth daily.    . metoprolol succinate (TOPROL-XL) 50 MG 24 hr tablet Take 1 tablet (50 mg total) by mouth daily. Take with or immediately following a meal. 90 tablet 3  . Omega-3 Fatty Acids (FISH OIL) 1000 MG CAPS Take by mouth daily.     No current facility-administered medications for this visit.    Allergies  Allergen Reactions  . Shellfish Allergy     Review of Systems negative except from HPI and PMH  Physical Exam BP 120/80 mmHg  Pulse 134  Ht 6' (1.829 m)  Wt 297 lb 4 oz (134.832 kg)  BMI 40.31 kg/m2 Well developed and nourished in no acute distress HENT normal Neck supple with JVP-flat Clear Rapid and irregular heart rate Abd-soft with active BS No Clubbing cyanosis edema Skin-warm and dry A & Oriented  Grossly normal sensory and motor function    Assessment and  Plan Atrial fibrillation paroxysmal  RVR  Hypertension  Obesity  He has developed atrial fibrillation last night after a paroxysm of coughing. He has not reverted on his own.  In the absence of structural heart disease, we will attempt to convert him with a single dose of flecainide-300 mg. Initial effort is undertaken with telemetry observation so we will have him go to the emergency room the morning if he has not reverted on his own. Following his flecainide 300, he needs to be observed for 3 hours if he converts.. If he has not converted after 2 hours, however, we should anticipate DC cardioversion and then discharge home.  We will have him increase his metoprolol this evening as his heart rate is rapid.

## 2014-05-20 ENCOUNTER — Telehealth: Payer: Self-pay | Admitting: Internal Medicine

## 2014-05-20 ENCOUNTER — Emergency Department (HOSPITAL_COMMUNITY)
Admission: EM | Admit: 2014-05-20 | Discharge: 2014-05-20 | Disposition: A | Discharge status: Home / Self Care | Payer: BC Managed Care – PPO | Attending: Emergency Medicine | Admitting: Emergency Medicine

## 2014-05-20 ENCOUNTER — Encounter (HOSPITAL_COMMUNITY): Payer: Self-pay | Admitting: *Deleted

## 2014-05-20 ENCOUNTER — Emergency Department (HOSPITAL_COMMUNITY): Payer: BC Managed Care – PPO

## 2014-05-20 DIAGNOSIS — I4891 Unspecified atrial fibrillation: Secondary | ICD-10-CM | POA: Insufficient documentation

## 2014-05-20 DIAGNOSIS — I10 Essential (primary) hypertension: Secondary | ICD-10-CM | POA: Diagnosis not present

## 2014-05-20 DIAGNOSIS — Z79899 Other long term (current) drug therapy: Secondary | ICD-10-CM | POA: Diagnosis not present

## 2014-05-20 DIAGNOSIS — Z8639 Personal history of other endocrine, nutritional and metabolic disease: Secondary | ICD-10-CM | POA: Insufficient documentation

## 2014-05-20 LAB — CBC WITH DIFFERENTIAL/PLATELET
BASOS ABS: 0 10*3/uL (ref 0.0–0.1)
BASOS PCT: 0 % (ref 0–1)
Eosinophils Absolute: 0.1 10*3/uL (ref 0.0–0.7)
Eosinophils Relative: 1 % (ref 0–5)
HEMATOCRIT: 47.2 % (ref 39.0–52.0)
Hemoglobin: 16.6 g/dL (ref 13.0–17.0)
Lymphocytes Relative: 32 % (ref 12–46)
Lymphs Abs: 1.9 10*3/uL (ref 0.7–4.0)
MCH: 31.6 pg (ref 26.0–34.0)
MCHC: 35.2 g/dL (ref 30.0–36.0)
MCV: 89.9 fL (ref 78.0–100.0)
MONO ABS: 0.5 10*3/uL (ref 0.1–1.0)
Monocytes Relative: 9 % (ref 3–12)
NEUTROS ABS: 3.5 10*3/uL (ref 1.7–7.7)
NEUTROS PCT: 58 % (ref 43–77)
PLATELETS: 162 10*3/uL (ref 150–400)
RBC: 5.25 MIL/uL (ref 4.22–5.81)
RDW: 12.3 % (ref 11.5–15.5)
WBC: 6 10*3/uL (ref 4.0–10.5)

## 2014-05-20 LAB — BASIC METABOLIC PANEL
ANION GAP: 16 — AB (ref 5–15)
BUN: 16 mg/dL (ref 6–23)
CALCIUM: 9.2 mg/dL (ref 8.4–10.5)
CHLORIDE: 104 meq/L (ref 96–112)
CO2: 23 mEq/L (ref 19–32)
CREATININE: 0.91 mg/dL (ref 0.50–1.35)
GFR calc non Af Amer: >90 mL/min (ref >90)
Glucose, Bld: 119 mg/dL — ABNORMAL HIGH (ref 70–99)
Potassium: 3.8 mEq/L (ref 3.7–5.3)
Sodium: 143 mEq/L (ref 137–147)

## 2014-05-20 LAB — PROTIME-INR
INR: 1.11 (ref 0.00–1.49)
PROTHROMBIN TIME: 14.4 s (ref 11.6–15.2)

## 2014-05-20 LAB — PRO B NATRIURETIC PEPTIDE: Pro B Natriuretic peptide (BNP): 1494 pg/mL — ABNORMAL HIGH (ref 0–125)

## 2014-05-20 LAB — TROPONIN I: Troponin I: <0.3 ng/mL (ref <0.30)

## 2014-05-20 MED ORDER — PROPOFOL 10 MG/ML IV BOLUS
INTRAVENOUS | Status: AC | PRN
  Administered 2014-05-20: 100 mg via INTRAVENOUS

## 2014-05-20 MED ORDER — FLECAINIDE ACETATE 100 MG PO TABS
300.0000 mg | ORAL_TABLET | Freq: Once | ORAL | Status: AC
  Administered 2014-05-20: 300 mg via ORAL
  Filled 2014-05-20 (×2): qty 3

## 2014-05-20 MED ORDER — PROPOFOL 10 MG/ML IV BOLUS
0.5000 mg/kg | Freq: Once | INTRAVENOUS | Status: DC
  Filled 2014-05-20: qty 20

## 2014-05-20 MED ORDER — APIXABAN 5 MG PO TABS: 5.0000 mg | ORAL_TABLET | Freq: Two times a day (BID) | ORAL | Status: DC

## 2014-05-20 NOTE — ED Provider Notes (Signed)
CSN: [id number]     Arrival date & time 05/20/14  7829 History   First MD Initiated Contact with Patient 05/20/14 [phone]     Chief Complaint  Patient presents with  . Atrial Fibrillation     (Consider location/radiation/quality/duration/timing/severity/associated sxs/prior Treatment) HPI Comments: Patient presents with palpitations since 9 PM Monday night. Denies any chest pain or shortness of breath. He saw cardiology yesterday who confirmed he was in atrial fibrillation which he has had before he was sent to the ED to receive a dose of flecainide. He does not take flecainide chronically. He has taken 3 doses of eliquis since yesterday but he does not take that normally either. Denies any abdominal pain, nausea, vomiting, back pain. Denies any dizziness or lightheadedness. Some shortness of breath and fatigue. Has had a dry cough.  The history is provided by the patient and a relative.    Past Medical History  Diagnosis Date  . Hypertension 03/03/96  . Hyperlipidemia   . Atrial fibrillation   . Cardiomyopathy     Resolved 05/03/09   Past Surgical History  Procedure Laterality Date  . Cardioversion     Family History  Problem Relation Age of Onset  . Hypertension Mother   . Hypertension Father   . Diabetes Neg Hx   . Coronary artery disease Neg Hx   . Cancer Neg Hx     Colon or prostate   History  Substance Use Topics  . Smoking status: Never Smoker   . Smokeless tobacco: Not on file  . Alcohol Use: Yes     Comment: Rare    Review of Systems  Constitutional: Negative for fever, activity change and appetite change.  Respiratory: Negative for cough, chest tightness and shortness of breath.   Cardiovascular: Positive for palpitations.  Gastrointestinal: Negative for nausea, vomiting and abdominal pain.  Genitourinary: Negative for dysuria and hematuria.  Musculoskeletal: Negative for myalgias and arthralgias.  Neurological: Negative for dizziness, weakness and headaches.   A complete 10 system review of systems was obtained and all systems are negative except as noted in the HPI and PMH.      Allergies  Shellfish allergy  Home Medications   Prior to Admission medications   Medication Sig Start Date End Date Taking? Authorizing Provider  benazepril (LOTENSIN) 20 MG tablet Take 20 mg by mouth daily.   Yes Historical Provider, MD  chlorthalidone (HYGROTON) 25 MG tablet Take 25 mg by mouth daily.   Yes Historical Provider, MD  Cranberry-Vitamin C-Probiotic (AZO CRANBERRY) 250-30 MG TABS Take 1 tablet by mouth daily.    Yes Historical Provider, MD  metoprolol succinate (TOPROL-XL) 50 MG 24 hr tablet Take 1 tablet (50 mg total) by mouth daily. Take with or immediately following a meal. 03/31/14  Yes Duke Salvia, MD  Multiple Vitamin (MULTIVITAMIN WITH MINERALS) TABS tablet Take 1 tablet by mouth daily.   Yes Historical Provider, MD  Omega-3 Fatty Acids (FISH OIL) 1000 MG CAPS Take 1 capsule by mouth daily.    Yes Historical Provider, MD   BP 111/92 mmHg  Pulse 90  Temp(Src) 97.7 F (36.5 C) (Oral)  Resp 11  Ht 6' (1.829 m)  Wt 296 lb (134.265 kg)  BMI 40.14 kg/m2  SpO2 95% Physical Exam  Constitutional: He is oriented to person, place, and time. He appears well-developed and well-nourished. No distress.  HENT:  Head: Normocephalic and atraumatic.  Mouth/Throat: Oropharynx is clear and moist. No oropharyngeal exudate.  Eyes: Conjunctivae and EOM are  normal. Pupils are equal, round, and reactive to light.  Neck: Normal range of motion. Neck supple.  No meningismus.  Cardiovascular: Normal rate, normal heart sounds and intact distal pulses.   No murmur heard. Irregular rhythm  Pulmonary/Chest: Effort normal and breath sounds normal. No respiratory distress.  Abdominal: Soft. There is no tenderness. There is no rebound and no guarding.  Musculoskeletal: Normal range of motion. He exhibits no edema or tenderness.  Neurological: He is alert and  oriented to person, place, and time. No cranial nerve deficit. He exhibits normal muscle tone. Coordination normal.  No ataxia on finger to nose bilaterally. No pronator drift. 5/5 strength throughout. CN 2-12 intact. Negative Romberg. Equal grip strength. Sensation intact. Gait is normal.   Skin: Skin is warm.  Psychiatric: He has a normal mood and affect. His behavior is normal.  Nursing note and vitals reviewed.   ED Course  Procedural sedation Date/Time: 05/20/2014 3:37 PM Performed by: Glynn OctaveANCOUR, Emiliana Blaize Authorized by: Glynn OctaveANCOUR, [place] Consent: Verbal consent obtained. Written consent obtained. Risks and benefits: risks, benefits and alternatives were discussed Consent given by: patient and spouse Patient understanding: patient states understanding of the procedure being performed Patient consent: the patient's understanding of the procedure matches consent given Procedure consent: procedure consent matches procedure scheduled Relevant documents: relevant documents present and verified Test results: test results available and properly labeled Site marked: the operative site was marked Imaging studies: imaging studies available Patient identity confirmed: verbally with patient and provided demographic data Time out: Immediately prior to procedure a "time out" was called to verify the correct patient, procedure, equipment, support staff and site/side marked as required. Local anesthesia used: no Patient sedated: yes Sedation type: moderate (conscious) sedation Sedatives: propofol Sedation start date/time: 05/20/2014 3:06 PM Sedation end date/time: 05/20/2014 3:30 PM Vitals: Vital signs were monitored during sedation. Patient tolerance: Patient tolerated the procedure well with no immediate complications   (including critical care time) Labs Review Labs Reviewed  BASIC METABOLIC PANEL - Abnormal; Notable for the following:    Glucose, Bld 119 (*)    Anion gap 16 (*)    All  other components within normal limits  PRO B NATRIURETIC PEPTIDE - Abnormal; Notable for the following:    Pro B Natriuretic peptide (BNP) 1494.0 (*)    All other components within normal limits  CBC WITH DIFFERENTIAL  TROPONIN I  PROTIME-INR    Imaging Review Dg Chest 2 View  05/20/2014   CLINICAL DATA:  Recurrent atrial fibrillation since Monday. Slight shortness of breath. Tachycardia.  EXAM: CHEST  2 VIEW  COMPARISON:  None.  FINDINGS: The heart size and pulmonary vascularity are normal and the lungs are clear. No effusions. No significant osseous abnormality.  IMPRESSION: Normal appearing heart and lungs.   Electronically Signed   By: Geanie CooleyJim  Maxwell M.D.   On: 05/20/2014 09:52     EKG Interpretation   Date/Time:  Wednesday May 20 2014 08:46:46 EST Ventricular Rate:  144 PR Interval:    QRS Duration: 82 QT Interval:  324 QTC Calculation: 501 R Axis:   -2 Text Interpretation:  Atrial fibrillation with rapid ventricular response  Nonspecific ST abnormality Abnormal ECG Rate faster Confirmed by Manus GunningANCOUR   MD, Alyannah Sanks ([id number]) on 05/20/2014 9:23:44 AM      MDM   Final diagnoses:  A-fib   Atrial fibrillation for the past 36 hours. No chest pain. Sent by cardiology to receive flecainide. Less than 48 hours of atrial fibrillation.  He appears to be  safe for cardioversion though he has only had 3 doses of eliquis.   1:30 PM. 3 hours after flecainide, patient remains in atrial fibrillation. We will arrange for cardioversion per cardiology recommendations.  Patient and wife agreeable to cardioversion. They have discussed with Dr. Graciela HusbandsKlein and Dr. Rennis GoldenHilty that he is at low risk for stroke as his palpitations have been ongoing for less than 48 hours. He is also been started on eliquis though has not yet reached steady state.   Dr. Graciela HusbandsKlein and Dr. Rennis GoldenHilty feel comfortable with cardioversion.  Cardioversion performed as above with Dr. Rennis GoldenHilty. Patient tolerated well. Sinus rhythm on EKG  subsequently.  Patient back to baseline after cardioversion. He will be prescribed eliquis and will follow up with Dr. Graciela HusbandsKlein. Return precautions discussed.  BP 116/76 mmHg  Pulse 78  Temp(Src) 97.7 F (36.5 C) (Oral)  Resp 15  Ht 6' (1.829 m)  Wt 296 lb (134.265 kg)  BMI 40.14 kg/m2  SpO2 94%      Glynn OctaveStephen Jovanie Verge, MD 05/20/14 [phone]

## 2014-05-20 NOTE — Telephone Encounter (Signed)
I called to make Trish aware of the patient being present in the ER and that he had an order with him to get Flecainide 300 mg x 1 dose this morning. If after 3 hours, he doesn't convert, he will need to be DCCV. She is aware that he is present in the ER.

## 2014-05-20 NOTE — Discharge Instructions (Signed)
Atrial Fibrillation Call Dr. Graciela HusbandsKlein for additional medication adjustments.  Take the eliquis as prescribed.  Return to the ED if you develop chest pain, shortness of breath, or any other concerns. Atrial fibrillation is a type of irregular heart rhythm (arrhythmia). During atrial fibrillation, the upper chambers of the heart (atria) quiver continuously in a chaotic pattern. This causes an irregular and often rapid heart rate.  Atrial fibrillation is the result of the heart becoming overloaded with disorganized signals that tell it to beat. These signals are normally released one at a time by a part of the right atrium called the sinoatrial node. They then travel from the atria to the lower chambers of the heart (ventricles), causing the atria and ventricles to contract and pump blood as they pass. In atrial fibrillation, parts of the atria outside of the sinoatrial node also release these signals. This results in two problems. First, the atria receive so many signals that they do not have time to fully contract. Second, the ventricles, which can only receive one signal at a time, beat irregularly and out of rhythm with the atria.  There are three types of atrial fibrillation:   Paroxysmal. Paroxysmal atrial fibrillation starts suddenly and stops on its own within a week.  Persistent. Persistent atrial fibrillation lasts for more than a week. It may stop on its own or with treatment.  Permanent. Permanent atrial fibrillation does not go away. Episodes of atrial fibrillation may lead to permanent atrial fibrillation. Atrial fibrillation can prevent your heart from pumping blood normally. It increases your risk of stroke and can lead to heart failure.  CAUSES   Heart conditions, including a heart attack, heart failure, coronary artery disease, and heart valve conditions.   Inflammation of the sac that surrounds the heart (pericarditis).  Blockage of an artery in the lungs (pulmonary  embolism).  Pneumonia or other infections.  Chronic lung disease.  Thyroid problems, especially if the thyroid is overactive (hyperthyroidism).  Caffeine, excessive alcohol use, and use of some illegal drugs.   Use of some medicines, including certain decongestants and diet pills.  Heart surgery.   Birth defects.  Sometimes, no cause can be found. When this happens, the atrial fibrillation is called lone atrial fibrillation. The risk of complications from atrial fibrillation increases if you have lone atrial fibrillation and you are age 53 years or older. RISK FACTORS  Heart failure.  Coronary artery disease.  Diabetes mellitus.   High blood pressure (hypertension).   Obesity.   Other arrhythmias.   Increased age. SIGNS AND SYMPTOMS   A feeling that your heart is beating rapidly or irregularly.   A feeling of discomfort or pain in your chest.   Shortness of breath.   Sudden light-headedness or weakness.   Getting tired easily when exercising.   Urinating more often than normal (mainly when atrial fibrillation first begins).  In paroxysmal atrial fibrillation, symptoms may start and suddenly stop. DIAGNOSIS  Your health care provider may be able to detect atrial fibrillation when taking your pulse. Your health care provider may have you take a test called an ambulatory electrocardiogram (ECG). An ECG records your heartbeat patterns over a 24-hour period. You may also have other tests, such as:  Transthoracic echocardiogram (TTE). During echocardiography, sound waves are used to evaluate how blood flows through your heart.  Transesophageal echocardiogram (TEE).  Stress test. There is more than one type of stress test. If a stress test is needed, ask your health care provider about which  type is best for you.  Chest X-ray exam.  Blood tests.  Computed tomography (CT). TREATMENT  Treatment may include:  Treating any underlying conditions. For  example, if you have an overactive thyroid, treating the condition may correct atrial fibrillation.  Taking medicine. Medicines may be given to control a rapid heart rate or to prevent blood clots, heart failure, or a stroke.  Having a procedure to correct the rhythm of the heart:  Electrical cardioversion. During electrical cardioversion, a controlled, low-energy shock is delivered to the heart through your skin. If you have chest pain, very low blood pressure, or sudden heart failure, this procedure may need to be done as an emergency.  Catheter ablation. During this procedure, heart tissues that send the signals that cause atrial fibrillation are destroyed.  Surgical ablation. During this surgery, thin lines of heart tissue that carry the abnormal signals are destroyed. This procedure can either be an open-heart surgery or a minimally invasive surgery. With the minimally invasive surgery, small cuts are made to access the heart instead of a large opening.  Pulmonary venous isolation. During this surgery, tissue around the veins that carry blood from the lungs (pulmonary veins) is destroyed. This tissue is thought to carry the abnormal signals. HOME CARE INSTRUCTIONS   Take medicines only as directed by your health care provider. Some medicines can make atrial fibrillation worse or recur.  If blood thinners were prescribed by your health care provider, take them exactly as directed. Too much blood-thinning medicine can cause bleeding. If you take too little, you will not have the needed protection against stroke and other problems.  Perform blood tests at home if directed by your health care provider. Perform blood tests exactly as directed.  Quit smoking if you smoke.  Do not drink alcohol.  Do not drink caffeinated beverages such as coffee, soda, and some teas. You may drink decaffeinated coffee, soda, or tea.   Maintain a healthy weight.Do not use diet pills unless your health care  provider approves. They may make heart problems worse.   Follow diet instructions as directed by your health care provider.  Exercise regularly as directed by your health care provider.  Keep all follow-up visits as directed by your health care provider. This is important. PREVENTION  The following substances can cause atrial fibrillation to recur:   Caffeinated beverages.  Alcohol.  Certain medicines, especially those used for breathing problems.  Certain herbs and herbal medicines, such as those containing ephedra or ginseng.  Illegal drugs, such as cocaine and amphetamines. Sometimes medicines are given to prevent atrial fibrillation from recurring. Proper treatment of any underlying condition is also important in helping prevent recurrence.  SEEK MEDICAL CARE IF:  You notice a change in the rate, rhythm, or strength of your heartbeat.  You suddenly begin urinating more frequently.  You tire more easily when exerting yourself or exercising. SEEK IMMEDIATE MEDICAL CARE IF:   You have chest pain, abdominal pain, sweating, or weakness.  You feel nauseous.  You have shortness of breath.  You suddenly have swollen feet and ankles.  You feel dizzy.  Your face or limbs feel numb or weak.  You have a change in your vision or speech. MAKE SURE YOU:   Understand these instructions.  Will watch your condition.  Will get help right away if you are not doing well or get worse. Document Released: 06/19/2005 Document Revised: 11/03/2013 Document Reviewed: 07/30/2012 [facility] Patient Information 2015 [place], [place]. This information is not intended  to replace advice given to you by your health care provider. Make sure you discuss any questions you have with your health care provider. ° °

## 2014-05-20 NOTE — ED Notes (Signed)
Pt sent here by md due to atrial fib since Monday, hx of same. Pt reports fatigue and mild sob. Denies any cp. ekg done at triage, airway intact.

## 2014-05-20 NOTE — CV Procedure (Addendum)
    CARDIOVERSION NOTE  Procedure: Electrical Cardioversion Indications:  Atrial Fibrillation  Procedure Details:  Mr. Jose Garcia was sent to the ER by Dr. Graciela HusbandsKlein with new onset afib <48 hrs. He was placed on Eliquis, but has only received 3 doses. He was given flecainide 300 mg which did not convert him to sinus. I discussed the case with Dr. Graciela HusbandsKlein and he recommended DCCV.   Consent: Risks of procedure as well as the alternatives and risks of each were explained to the (patient/caregiver).  Consent for procedure obtained.  Time Out: Verified patient identification, verified procedure, site/side was marked, verified correct patient position, special equipment/implants available, medications/allergies/relevent history reviewed, required imaging and test results available.  Performed  Patient placed on cardiac monitor, pulse oximetry, supplemental oxygen as necessary.  Sedation given: 200 mg Propofol per the ER attending physician Pacer pads placed anterior and posterior chest.  Cardioverted 1 time(s).  Cardioverted at 200J biphasic.  Impression: Findings: Post procedure EKG shows: NSR Complications: None Patient did tolerate procedure well.  Plan: 1. Successful DCCV to NSR with a single biphasic shock at 200J. 2. Continue Eliquis 5 mg BID.  3. Follow-up with Dr. Graciela HusbandsKlein  Time Spent Directly with the Patient:  30 minutes   Chrystie NoseKenneth C. Hilty, MD, [facility] Attending Cardiologist [facility]  HILTY,Kenneth C 05/20/2014, 3:04 PM

## 2014-05-20 NOTE — ED Notes (Signed)
Pt states that he is start Flecanide and  Dr Graciela HusbandsKlein. sent him over to be monitored x 3 hours. startted w/ Atrial Fib Monday night

## 2014-07-13 ENCOUNTER — Ambulatory Visit (INDEPENDENT_AMBULATORY_CARE_PROVIDER_SITE_OTHER): Payer: BLUE CROSS/BLUE SHIELD | Admitting: Cardiovascular Disease

## 2014-07-13 ENCOUNTER — Telehealth: Payer: Self-pay | Admitting: Cardiovascular Disease

## 2014-07-13 ENCOUNTER — Telehealth: Payer: Self-pay

## 2014-07-13 ENCOUNTER — Encounter: Payer: Self-pay | Admitting: Cardiovascular Disease

## 2014-07-13 VITALS — BP 124/80 | HR 128 | Ht 72.0 in | Wt 301.4 lb

## 2014-07-13 DIAGNOSIS — I4891 Unspecified atrial fibrillation: Secondary | ICD-10-CM

## 2014-07-13 DIAGNOSIS — I10 Essential (primary) hypertension: Secondary | ICD-10-CM

## 2014-07-13 MED ORDER — METOPROLOL SUCCINATE ER 100 MG PO TB24: 100.0000 mg | ORAL_TABLET | Freq: Every day | ORAL | Status: DC

## 2014-07-13 MED ORDER — FLECAINIDE ACETATE 100 MG PO TABS: 100.0000 mg | ORAL_TABLET | Freq: Two times a day (BID) | ORAL | Status: DC

## 2014-07-13 NOTE — Telephone Encounter (Signed)
Patient stated he is in afib agin  His heart rate is in the 90's He stated he "does not feel to bad but can definitely tell he is in afib"  Discussed patients situation with Dr. Elease HashimotoNahser (who is in the office today) Patient scheduled to see  Dr. Elease HashimotoNahser at 2 pm today  Patient verbalized understanding

## 2014-07-13 NOTE — Progress Notes (Signed)
      Patient Care Team: Janeann ForehandJames H Hawkins Jr., MD as PCP - General (Family Medicine) Primary cardiologist:  Graciela HusbandsKlein   HPI  [name] is a 54 y.o. male Is seen as an add-on today. He has a history of paroxysmal atrial fibrillation associated with a transient cardiomyopathy. he has a history of treated hypertension; he takes aspirin with a CHADS-VASc score of one  He came in early Feb with AFib >>TEEDCCV    He comes in this evening having gone into atrial fibrillation yesterday (11/16) at 9:00 following a paroxysm of coughing.  Last ejection fraction TEE 2/15 50-55%  Jan. 11, 2016:  Mr. Jose Garcia is a  54 year old gentleman who is a patient of Dr. Odessa FlemingKlein's. He recently had a cardioversion.  He initially took  flecainide 300 mg but did not convert.   He thought that his heart rate was feeling fairly irregular and he called today to be seen. He has had several episodes of atrial fib.  He feels drained since last night.    He is able to do all of his normal activities despite being in atrial fib.  He is in the used car business.   He has had PAF for several years.  He has not had a stress test.  No angina doing his normal activities. Does not get any regular exercise.     Past Medical History  Diagnosis Date  . Hypertension 03/03/96  . Hyperlipidemia   . Atrial fibrillation   . Cardiomyopathy     Resolved 05/03/09    Past Surgical History  Procedure Laterality Date  . Cardioversion      Current Outpatient Prescriptions  Medication Sig Dispense Refill  . apixaban (ELIQUIS) 5 MG TABS tablet Take 1 tablet (5 mg total) by mouth 2 (two) times daily. 60 tablet 0  . benazepril (LOTENSIN) 20 MG tablet Take 20 mg by mouth daily.    . chlorthalidone (HYGROTON) 25 MG tablet Take 25 mg by mouth daily.    . metoprolol succinate (TOPROL-XL) 50 MG 24 hr tablet Take 1 tablet (50 mg total) by mouth daily. Take with or immediately following a meal. 90 tablet 3  . Multiple Vitamin (MULTIVITAMIN  WITH MINERALS) TABS tablet Take 1 tablet by mouth daily.    . Omega-3 Fatty Acids (FISH OIL) 1000 MG CAPS Take 1 capsule by mouth daily.      No current facility-administered medications for this visit.    Allergies  Allergen Reactions  . Shellfish Allergy Itching    Review of Systems negative except from HPI and PMH  Physical Exam BP 124/80 mmHg  Pulse 128  Ht 6' (1.829 m)  Wt 301 lb 6.4 oz (136.714 kg)  BMI 40.87 kg/m2 Well developed and nourished in no acute distress HENT normal Neck supple with  Clear Rapid and irregular heart rate Abd-soft with active BS No Clubbing cyanosis edema Skin-warm and dry A & Oriented  Grossly normal sensory and motor function   ECG: 07/13/2014: Atrial fibrillation rate 120. He has no ST or T wave changes. Assessment and  Plan

## 2014-07-13 NOTE — Telephone Encounter (Signed)
Pt called, states he is in Afib, started last night at [facility]7PM, and is still experiencing this now. Please call.

## 2014-07-13 NOTE — Patient Instructions (Addendum)
Please increase your Toprol to 100 mg once daily  Please start flecainide 100 mg twice daily  Please follow up with Dr. Klein in 3-4 weeks [facility]  Your caregiver has ordered a StressGraciela Garcia Test with nuclear imaging. The purpose of this test is to evaluate the blood supply to your heart muscle. This procedure is referred to as a "Non-Invasive Stress Test." This is because other than having an IV started in your vein, nothing is inserted or "invades" your body. Cardiac stress tests are done to find areas of poor blood flow to the heart by determining the extent of coronary artery disease (CAD). Some patients exercise on a treadmill, which naturally increases the blood flow to your heart, while others who are  unable to walk on a treadmill due to physical limitations have a pharmacologic/chemical stress agent called Lexiscan . This medicine will mimic walking on a treadmill by temporarily increasing your coronary blood flow.   Please note: these test may take anywhere between 2-4 hours to complete  [facility]  THE VOLUNTEERS AT THE FIRST DESK WILL DIRECT YOU WHERE TO GO  Date of Procedure:______Wednesday, Jan 20 & Thursday, Jan 21__________  Arrival Time for Procedure:_____7:15 am_________  Instructions regarding medication:   __X__:  Hold betablocker(s) night before procedure and morning of procedure: TOPROL  PLEASE NOTIFY THE OFFICE AT LEAST 24 HOURS IN ADVANCE IF YOU ARE UNABLE TO KEEP YOUR APPOINTMENT.  [phone] AND  PLEASE NOTIFY [facility] AT [facility] AT LEAST 24 HOURS IN ADVANCE IF YOU ARE UNABLE TO KEEP YOUR APPOINTMENT. [phone]  How to prepare for your Myoview test:  1. Do not eat or drink after midnight 2. No caffeine for 24 hours prior to test 3. No smoking 24 hours prior to test. 4. Your medication may be taken with water.  If your doctor stopped a medication because of this test, do not take that medication. 5. Ladies, please do  not wear dresses.  Skirts or pants are appropriate. Please wear a short sleeve shirt. 6. No perfume, cologne or lotion. 7. Wear comfortable walking shoes. No heels!  Call or return to clinic prn if these symptoms worsen or fail to improve as anticipated.

## 2014-07-13 NOTE — Assessment & Plan Note (Addendum)
Jose Garcia presents with recurrent atrial fibrillation. His heart rate is 128. He can feel that his heart is regular but otherwise he is not severely limited in what is able to do. He does feel a little bit fatigued compared to normal.  He's started back on Eliquis as of today.  We'll slow his heart rate by increasing his metoprolol to 100 mg a day. We'll start him on flecainide 100 mg twice a day. We will have him see Dr. Graciela HusbandsKlein in the next several weeks.   Dr. Graciela HusbandsKlein can decide whether or not flecainide is the right choice for him.    We will also have him return for a stress Myoview study in one to 2 weeks.  He has a blood pressure cuff at home. I've encouraged him to take his blood pressure and heart rate on a daily basis.   I will see him in a month for follow up ( Dr. Graciela HusbandsKlein is not avaliable until March)

## 2014-07-13 NOTE — Telephone Encounter (Signed)
Per dr Elease Hashimotonahser, pt needs to sch an 4 week fu with him.  Should be 08/06/14.  lmov to call and schedule this.

## 2014-07-13 NOTE — Assessment & Plan Note (Signed)
His BP is well controlled. If his BP drops too quickly and his HR is still not controlled, we will decrease or DC his chlorthaladone to allow us to up-titrate his HR slowing meds as needed.

## 2014-07-14 ENCOUNTER — Telehealth: Payer: Self-pay | Admitting: Cardiovascular Disease

## 2014-07-14 NOTE — Telephone Encounter (Signed)
Spoke w/ pt's wife.  Advised her that pt's stress test is to evaluate flecainide and another rx can be sent if Dr. Elease HashimotoNahser feels this is working for him.  Asked her to call back w/ any further questions or concerns.

## 2014-07-14 NOTE — Telephone Encounter (Signed)
Pt c/o medication issue:  1. Name of Medication: Flecainide Acetate   2. How are you currently taking this medication (dosage and times per day)? He took one last night and one this morning.   3. Are you having a reaction (difficulty breathing--STAT)? no  4. What is your medication issue? Bottle says that patient is suppose to take 1 tab two times a day and they only ave him 30 tablets.   Just wanted to make sure if that's the case will they get another refill. Or is this just to last until the stress test. Please advise

## 2014-07-20 ENCOUNTER — Telehealth: Payer: Self-pay | Admitting: Cardiovascular Disease

## 2014-07-20 NOTE — Telephone Encounter (Signed)
Instructed patient to keep myoview appt as planned  Patient verbalized understanding

## 2014-07-20 NOTE — Telephone Encounter (Signed)
Pt called having questions about procedure   He states he is an Afib patient and not in anymore since med's have been working Questions is should he still do the stress test. Please advise.

## 2014-07-22 ENCOUNTER — Other Ambulatory Visit: Payer: Self-pay

## 2014-07-22 ENCOUNTER — Ambulatory Visit: Payer: Self-pay

## 2014-07-22 DIAGNOSIS — I4891 Unspecified atrial fibrillation: Secondary | ICD-10-CM

## 2014-07-22 DIAGNOSIS — I10 Essential (primary) hypertension: Secondary | ICD-10-CM

## 2014-07-23 ENCOUNTER — Telehealth: Payer: Self-pay

## 2014-07-23 DIAGNOSIS — I10 Essential (primary) hypertension: Secondary | ICD-10-CM

## 2014-07-23 DIAGNOSIS — I4891 Unspecified atrial fibrillation: Secondary | ICD-10-CM

## 2014-07-23 MED ORDER — FLECAINIDE ACETATE 100 MG PO TABS: 100.0000 mg | ORAL_TABLET | Freq: Two times a day (BID) | ORAL | Status: DC

## 2014-07-23 MED ORDER — HYDROCHLOROTHIAZIDE 25 MG PO TABS: 12.5000 mg | ORAL_TABLET | Freq: Every day | ORAL | Status: DC

## 2014-07-23 NOTE — Telephone Encounter (Signed)
Patient was seen in clinic 1/11 He was started on Flecainide  He stated he was told by Dr. Elease HashimotoNahser if blood pressure became to low to drop Chlorthalidone   His blood pressure dropped to 103/65 so he stopped Chlorthalidone  His blood pressure then returned to normal  He stated on Sunday 1/17 he felt like he converted back to normal sinus rhythm  He now feels like he is putting on fluid and ankles are swollen  He has gaines 6-7 lbs and is having shortness of breath His ankles are swollen  His bp is now 130/80 Heart rate is in the 60's  He stated the shortness of breath does not feel emergent but it is uncomfortable  He is not sure if the swelling started when he started the Flecainide or when he converted to NSR but he stated he is worried it is related to his medication change

## 2014-07-23 NOTE — Telephone Encounter (Signed)
Pt has some questions regarding his medications, states he is starting to have some swelling. Please call.

## 2014-07-23 NOTE — Telephone Encounter (Signed)
Informed patient of Dr. Namon CirriNahsers recommendation Medication ordered  Patient verbalized understanding   Patient stated his EKG was done yesterday during his stress test and they confirmed he was in normal sinus rhythm    Per Dr. Elease HashimotoNahser refill Flecainide  Patient aware

## 2014-07-23 NOTE — Telephone Encounter (Signed)
Can he come in for ECG to see if he converted to NSR. Would start HCTZ back at 1/2 tablet a day and see if that helps the ankle swelling. Could also try a whole tablet on occasion

## 2014-08-06 ENCOUNTER — Encounter: Payer: Self-pay | Admitting: Cardiovascular Disease

## 2014-08-06 ENCOUNTER — Ambulatory Visit (INDEPENDENT_AMBULATORY_CARE_PROVIDER_SITE_OTHER): Payer: BLUE CROSS/BLUE SHIELD | Admitting: Cardiovascular Disease

## 2014-08-06 ENCOUNTER — Other Ambulatory Visit: Payer: Self-pay

## 2014-08-06 VITALS — BP 124/94 | HR 111 | Ht 72.0 in | Wt 299.4 lb

## 2014-08-06 DIAGNOSIS — I4891 Unspecified atrial fibrillation: Secondary | ICD-10-CM

## 2014-08-06 DIAGNOSIS — I10 Essential (primary) hypertension: Secondary | ICD-10-CM

## 2014-08-06 MED ORDER — FLECAINIDE ACETATE 150 MG PO TABS: 150.0000 mg | ORAL_TABLET | Freq: Two times a day (BID) | ORAL | Status: DC

## 2014-08-06 NOTE — Progress Notes (Signed)
Cardiology Office Note   Date:  08/06/2014   ID:  Jose Garcia, DOB [birth year], MRN [id number]  PCP:  Jose Garcia,  Jose Gitelman, MD  Cardiologist:   Jose Garcia Jose Ping, MD   Chief Complaint  Patient presents with  . Follow-up    atrial fib      History of Present Illness Jose Garcia is a 54 y.o. male who presents for atrial fib  [name] was started on Flecainide and his metoprolol  was increased during his last visit.  He has had PAF since starting the flecainide.  He was cardioverted 5 years ago and then developed Afib last year.  He had a second cardioversion in march of 2015.   He went back into AFib in November. He has had several episodes of NSR since I saw him last month..   He has not noticed any specific activity that would cause him to go back into Afib. He was in NSR for 10-12 days and then went back into afib 5 days ago.  Has not been getting any regular exercise.   Past Medical History  Diagnosis Date  . Hypertension 03/03/96  . Hyperlipidemia   . Atrial fibrillation   . Cardiomyopathy     Resolved 05/03/09    Past Surgical History  Procedure Laterality Date  . Cardioversion       Current Outpatient Prescriptions  Medication Sig Dispense Refill  . apixaban (ELIQUIS) 5 MG TABS tablet Take 1 tablet (5 mg total) by mouth 2 (two) times daily. 60 tablet 0  . benazepril (LOTENSIN) 20 MG tablet Take 20 mg by mouth daily.    . flecainide (TAMBOCOR) 100 MG tablet Take 1 tablet (100 mg total) by mouth 2 (two) times daily. 60 tablet 6  . hydrochlorothiazide (HYDRODIURIL) 25 MG tablet Take 0.5 tablets (12.5 mg total) by mouth daily. 30 tablet 3  . metoprolol succinate (TOPROL-XL) 100 MG 24 hr tablet Take 1 tablet (100 mg total) by mouth daily. Take with or immediately following a meal. 30 tablet 6  . Multiple Vitamin (MULTIVITAMIN WITH MINERALS) TABS tablet Take 1 tablet by mouth daily.    . Omega-3 Fatty Acids (FISH OIL) 1000 MG CAPS Take 1 capsule by mouth daily.       . chlorthalidone (HYGROTON) 25 MG tablet Take 25 mg by mouth daily.     No current facility-administered medications for this visit.    Allergies:   Shellfish allergy    Social History:  The patient  reports that he has never smoked. He does not have any smokeless tobacco history on file. He reports that he drinks alcohol. He reports that he does not use illicit drugs.   Family History:  The patient's family history includes Hypertension in his father and mother. There is no history of Diabetes, Coronary artery disease, or Cancer.    ROS:  Please see the history of present illness.    Review of Systems: Constitutional:  denies fever, chills, diaphoresis, appetite change and fatigue.  HEENT: denies photophobia, eye pain, redness, hearing loss, ear pain, congestion, sore throat, rhinorrhea, sneezing, neck pain, neck stiffness and tinnitus.  Respiratory: denies SOB, DOE, cough, chest tightness, and wheezing.  Cardiovascular: admits to   palpitations and leg swelling.  Gastrointestinal: denies nausea, vomiting, abdominal pain, diarrhea, constipation, blood in stool.  Genitourinary: denies dysuria, urgency, frequency, hematuria, flank pain and difficulty urinating.  Musculoskeletal: denies  myalgias, back pain, joint swelling, arthralgias and gait problem.   Skin:  denies pallor, rash and wound.  Neurological: denies dizziness, seizures, syncope, weakness, light-headedness, numbness and headaches.   Hematological: denies adenopathy, easy bruising, personal or family bleeding history.  Psychiatric/ Behavioral: denies suicidal ideation, mood changes, confusion, nervousness, sleep disturbance and agitation.       All other systems are reviewed and negative.    PHYSICAL EXAM: VS:  BP 124/94 mmHg  Pulse 111  Ht 6' (1.829 m)  Wt 299 lb 6.4 oz (135.807 kg)  BMI 40.60 kg/m2 , BMI Body mass index is 40.6 kg/(m^2). GEN: Well nourished, well developed, in no acute distress HEENT:  normal Neck: no JVD, carotid bruits, or masses Cardiac: Irreg. Irreg. ; no murmurs, rubs, or gallops,no edema  Respiratory:  clear to auscultation bilaterally, normal work of breathing GI: soft, nontender, nondistended, + BS MS: no deformity or atrophy Skin: warm and dry, no rash Neuro:  Strength and sensation are intact Psych: normal   EKG:  EKG is ordered today. The ekg ordered today demonstrates Atrial fib with rate of 111. QRS duration of 104 ms.    Recent Labs: 05/20/2014: BUN 16; Creatinine 0.91; Hemoglobin 16.6; Platelets 162; Potassium 3.8; Pro B Natriuretic peptide (BNP) 1494.0*; Sodium 143    Lipid Panel    Component Value Date/Time   CHOL 196 12/31/2007 1527   TRIG 89 12/31/2007 1527   HDL 39.2 12/31/2007 1527   CHOLHDL 5.0 CALC 12/31/2007 1527   VLDL 18 12/31/2007 1527   LDLCALC 139* 12/31/2007 1527      Wt Readings from Last 3 Encounters:  08/06/14 299 lb 6.4 oz (135.807 kg)  07/13/14 301 lb 6.4 oz (136.714 kg)  05/20/14 296 lb (134.265 kg)      Other studies Reviewed: Additional studies/ records that were reviewed today include: . Review of the above records demonstrates:    ASSESSMENT AND PLAN:  1.  Atrial fib:  He has had some NSR but has gone back into atrial ablation. We will increase the flecainide to 150 mg  twice a day.  When he converted to sinus rhythm he was very bradycardic so he decreased his metoprolol XL to 50 mg s a day. I told him that this was the correct thing to do. Will continue current dose of Toprol XL 100 mg a day for now ( giving him permission to decrease dose if he becomes too bradycardic  2. Essential Hypertension:   BP is well controlle.   3. Hyperlipidemia:     Current medicines are reviewed at length with the patient today.  The patient does not have concerns regarding medicines.  The following changes have been made:  Increase Flecainide to 150 bid  Labs/ tests ordered today include:  No orders of the defined  types were placed in this encounter.     Disposition:   FU with Dr. Graciela HusbandsKlein in 1 month    Signed, [name], Jose PingPhilip J, MD  08/06/2014 11:47 AM    [facility] [address], [place], [place]  [address] Phone: [phone]; Fax: [phone]

## 2014-08-06 NOTE — Patient Instructions (Signed)
Your physician has recommended you make the following change in your medication:  Increase Flecainide 150 mg twice daily   Your physician recommends that you schedule a follow-up appointment in:  Keep follow up appt with Dr. Graciela HusbandsKlein

## 2014-08-17 ENCOUNTER — Other Ambulatory Visit: Payer: Self-pay | Admitting: Nurse Practitioner

## 2014-08-17 MED ORDER — APIXABAN 5 MG PO TABS: 5.0000 mg | ORAL_TABLET | Freq: Two times a day (BID) | ORAL | Status: DC

## 2014-09-01 ENCOUNTER — Encounter: Payer: Self-pay | Admitting: Internal Medicine

## 2014-09-01 ENCOUNTER — Ambulatory Visit (INDEPENDENT_AMBULATORY_CARE_PROVIDER_SITE_OTHER): Payer: BLUE CROSS/BLUE SHIELD | Admitting: Internal Medicine

## 2014-09-01 VITALS — BP 162/100 | HR 74 | Ht 72.0 in | Wt 299.5 lb

## 2014-09-01 DIAGNOSIS — I4891 Unspecified atrial fibrillation: Secondary | ICD-10-CM

## 2014-09-01 MED ORDER — DILTIAZEM HCL ER COATED BEADS 120 MG PO CP24: 120.0000 mg | ORAL_CAPSULE | Freq: Every day | ORAL | Status: DC

## 2014-09-01 MED ORDER — FLECAINIDE ACETATE 100 MG PO TABS: 100.0000 mg | ORAL_TABLET | Freq: Two times a day (BID) | ORAL | Status: DC

## 2014-09-01 NOTE — Progress Notes (Signed)
Patient Care Team: Jose Forehand., MD as PCP - General (Family Medicine)   HPI  Jose Garcia is a 54 y.o. male Is seen as an add-on today. He has a history of paroxysmal atrial fibrillation associated with a transient cardiomyopathy. he has a history of treated hypertension; he takes aspirin with a CHADS-VASc score of one  He came in early Feb with AFib >>TEEDCCV  he required repeat cardioversion 11/15. He was subtotally started on flecainide and underwent repeat cardioversion again 1/16  with early reversion to atrial fibrillation ; was also noted to have post cardioversion bradycardia. His metoprolol was decreased and his flecainide was increased. Cardioversion was not undertaken.  He has reverted to sinus rhythm.  date QRSd dose  6/15 94 o  2/16 104 100   3/16 116 150 bid       Last ejection fraction TEE 2/15 50-55%  Myoview scan 1/16 demonstrated no perfusion abnormalities and normal LV function  It is his impression that metoprolol has made him worse and he has stopped it. He notes his blood pressures at home are in the 150 range.  He's had some atrial fibrillation most recently of which he is aware 2/11. He reminds me he had a sleep study a couple years ago that was negative.      Past Medical History  Diagnosis Date  . Hypertension 03/03/96  . Hyperlipidemia   . Atrial fibrillation   . Cardiomyopathy     Resolved 05/03/09    Past Surgical History  Procedure Laterality Date  . Cardioversion      Current Outpatient Prescriptions  Medication Sig Dispense Refill  . apixaban (ELIQUIS) 5 MG TABS tablet Take 1 tablet (5 mg total) by mouth 2 (two) times daily. 60 tablet 6  . benazepril (LOTENSIN) 20 MG tablet Take 20 mg by mouth daily.    . flecainide (TAMBOCOR) 150 MG tablet Take 1 tablet (150 mg total) by mouth 2 (two) times daily. 60 tablet 3  . hydrochlorothiazide (HYDRODIURIL) 25 MG tablet Take 0.5 tablets (12.5 mg total) by mouth daily. 30 tablet 3    . Multiple Vitamin (MULTIVITAMIN WITH MINERALS) TABS tablet Take 1 tablet by mouth daily.    . Omega-3 Fatty Acids (FISH OIL) 1000 MG CAPS Take 1 capsule by mouth daily.      No current facility-administered medications for this visit.    Allergies  Allergen Reactions  . Shellfish Allergy Itching    Review of Systems negative except from HPI and PMH  Physical Exam BP 162/100 mmHg  Pulse 74  Ht 6' (1.829 m)  Wt 299 lb 8 oz (135.852 kg)  BMI 40.61 kg/m2 Well developed and nourished in no acute distress HENT normal Neck supple with JVP-flat Clear Rapid and irregular heart rate Abd-soft with active BS No Clubbing cyanosis edema Skin-warm and dry A & Oriented  Grossly normal sensory and motor function    Assessment and  Plan Atrial fibrillation paroxysmal RVR  Hypertension  Obesity  He has paroxysmal atrial fibrillation he is on anticoagulation with apixaban.  His QRS has widened by a little bit more than 20% since the initiation of flecainide. This is an area of concern. I'm inclined decrease the dose back to 100 mg a day. I will undertake a 2-D echo to look at left atrial size with consideration of referral for catheter ablation  We discussed the issues of the need for adjunct of rate control; we will put him  on Cardizem and I have reviewed side effects.  Blood pressures elevate; we reviewed the Sprint data and will use adjunctive AV nodal blockade in the form of Cardizem to address blood pressures well.   We'll plan to revisit the issue of catheter ablation in about 4 months time.  We spent more than 50% of our >45 min visit in face to face counseling regarding the above

## 2014-09-01 NOTE — Patient Instructions (Signed)
Your physician has recommended you make the following change in your medication:  Decrease Flecainide to 100 mg twice daily  Start Cardizem 120 mg once daily    Your physician has requested that you have an echocardiogram. Echocardiography is a painless test that uses sound waves to create images of your heart. It provides your doctor with information about the size and shape of your heart and how well your heart's chambers and valves are working. This procedure takes approximately one hour. There are no restrictions for this procedure.   Your physician recommends that you schedule a follow-up appointment in:  4 months

## 2014-09-28 ENCOUNTER — Other Ambulatory Visit (INDEPENDENT_AMBULATORY_CARE_PROVIDER_SITE_OTHER): Payer: BLUE CROSS/BLUE SHIELD

## 2014-09-28 ENCOUNTER — Other Ambulatory Visit: Payer: Self-pay

## 2014-09-28 DIAGNOSIS — I4891 Unspecified atrial fibrillation: Secondary | ICD-10-CM

## 2014-10-05 ENCOUNTER — Telehealth: Payer: Self-pay | Admitting: Internal Medicine

## 2014-10-05 MED ORDER — RANOLAZINE ER 500 MG PO TB12: 500.0000 mg | ORAL_TABLET | Freq: Two times a day (BID) | ORAL | Status: DC

## 2014-10-05 NOTE — Telephone Encounter (Signed)
New message ° ° ° ° ° °Want echo results °

## 2014-10-05 NOTE — Telephone Encounter (Signed)
Reviewed results with patient who verbalized understanding.  Patient also tells me that since decreasing flecainide to 100 mg BID he has been going in and out of AFib and staying "out" longer.  He states that he is staying in AFib for a week instead of days, like before. Patient reports feeling tired/SOB when he is in AFib, but this is not new and it does not "hamper his day".  He just doesn't like being in it.  He is aware I will review this with Dr. Graciela HusbandsKlein and let him know recommendations.

## 2014-10-05 NOTE — Telephone Encounter (Signed)
Per Dr. Graciela HusbandsKlein - advised to start Ranexa 500 mg BID. Rx sent to [facility] [place]. Patient verbalized understanding and agreeable to plan.

## 2014-10-07 ENCOUNTER — Telehealth: Payer: Self-pay | Admitting: Internal Medicine

## 2014-10-07 DIAGNOSIS — I480 Paroxysmal atrial fibrillation: Secondary | ICD-10-CM

## 2014-10-07 NOTE — Telephone Encounter (Signed)
Follow Up ° °Pt returned call//  °

## 2014-10-07 NOTE — Telephone Encounter (Addendum)
Patient and I discussed Ranexa and why it was started. He was appreciative for taking time to explain/discuss this information.   He would also [facility] to [facility]. Advised patient I will let him know when this has been done.

## 2014-10-08 NOTE — Telephone Encounter (Signed)
Informed patient placing referral to Dr. Macon LargeBahnson, Electrophysiologist at [facility]. Patient verbalized understanding and agreeable to plan.

## 2014-10-13 NOTE — Telephone Encounter (Signed)
Patient has appt w/ Dr Macon LargeBahnson on  11/09/14

## 2014-11-06 DIAGNOSIS — I429 Cardiomyopathy, unspecified: Secondary | ICD-10-CM | POA: Insufficient documentation

## 2014-12-03 ENCOUNTER — Other Ambulatory Visit: Payer: Self-pay | Admitting: Internal Medicine

## 2014-12-22 ENCOUNTER — Encounter: Payer: Self-pay | Admitting: Internal Medicine

## 2014-12-22 ENCOUNTER — Other Ambulatory Visit: Payer: Self-pay

## 2014-12-22 ENCOUNTER — Ambulatory Visit (INDEPENDENT_AMBULATORY_CARE_PROVIDER_SITE_OTHER): Payer: BLUE CROSS/BLUE SHIELD | Admitting: Internal Medicine

## 2014-12-22 VITALS — BP 156/82 | HR 61 | Ht 72.0 in | Wt 299.2 lb

## 2014-12-22 DIAGNOSIS — I4891 Unspecified atrial fibrillation: Secondary | ICD-10-CM

## 2014-12-22 MED ORDER — BENAZEPRIL HCL 20 MG PO TABS: 20.0000 mg | ORAL_TABLET | Freq: Every day | ORAL | Status: DC

## 2014-12-22 MED ORDER — DILTIAZEM HCL ER COATED BEADS 120 MG PO CP24: 120.0000 mg | ORAL_CAPSULE | Freq: Every day | ORAL | Status: DC

## 2014-12-22 NOTE — Progress Notes (Signed)
Patient Care Team: Janeann Forehand., MD as PCP - General (Family Medicine)   HPI  Jose Garcia is a 54 y.o. male Is seen as an add-on today. He has a history of paroxysmal atrial fibrillation associated with a transient cardiomyopathy. he has a history of treated hypertension; he takes aspirin with a CHADS-VASc score of one  He came in early Feb with AFib >>TEEDCCV  he required repeat cardioversion 11/15. He was subtotally started on flecainide and underwent repeat cardioversion again 1/16  with early reversion to atrial fibrillation ; was also noted to have post cardioversion bradycardia. His metoprolol was decreased and his flecainide was increased. Cardioversion was not undertaken.  He has reverted to sinus rhythm.  date QRSd dose  6/15 94 o  2/16 104 100   3/16 116 150 bid  6/16 116 100       Last ejection fraction TEE 2/15 50-55%  echocardiogram 3/16 demonstrated a left atrial dimension of 47/1.7  Myoview scan 1/16 demonstrated no perfusion abnormalities and normal LV function  At his last visit we decreased the flecainide because of increasing QRS width  In the interim he went to see Dr. Macon Large at [facility]. At that time having started the Ranexa he had no intercurrent atrial fibrillation. The decision was made just leave things alone. A sleep study was recommended  These records were reviewed.     Past Medical History  Diagnosis Date  . Hypertension 03/03/96  . Hyperlipidemia   . Atrial fibrillation   . Cardiomyopathy     Resolved 05/03/09    Past Surgical History  Procedure Laterality Date  . Cardioversion      Current Outpatient Prescriptions  Medication Sig Dispense Refill  . apixaban (ELIQUIS) 5 MG TABS tablet Take 1 tablet (5 mg total) by mouth 2 (two) times daily. 60 tablet 6  . benazepril (LOTENSIN) 20 MG tablet Take 20 mg by mouth daily.    Marland Kitchen diltiazem (CARDIZEM CD) 120 MG 24 hr capsule Take 1 capsule (120 mg total) by mouth daily. 30 capsule 6    . flecainide (TAMBOCOR) 100 MG tablet Take 1 tablet (100 mg total) by mouth 2 (two) times daily. 60 tablet 6  . hydrochlorothiazide (HYDRODIURIL) 25 MG tablet Take 0.5 tablets (12.5 mg total) by mouth daily. 30 tablet 3  . Multiple Vitamin (MULTIVITAMIN WITH MINERALS) TABS tablet Take 1 tablet by mouth daily.    . Omega-3 Fatty Acids (FISH OIL) 1000 MG CAPS Take 1 capsule by mouth daily.     Marland Kitchen RANEXA 500 MG 12 hr tablet TAKE 1 TABLET(500 MG) BY MOUTH TWICE DAILY 60 tablet 3   No current facility-administered medications for this visit.    Allergies  Allergen Reactions  . Shellfish Allergy Itching    Review of Systems negative except from HPI and PMH  Physical Exam BP 156/82 mmHg  Pulse 61  Ht 6' (1.829 m)  Wt 299 lb 4 oz (135.739 kg)  BMI 40.58 kg/m2 Well developed and nourished in no acute distress HENT normal Neck supple with JVP-flat Clear Rapid and irregular heart rate Abd-soft with active BS No Clubbing cyanosis edema Skin-warm and dry A & Oriented  Grossly normal sensory and motor function  ECG today demonstrated sinus rhythm at 61 Intervals 18/12  (116)/ 42  Assessment and  Plan Atrial fibrillation paroxysmal RVR  Hypertension  Obesity  He has had no recurrent atrial fibrillation on a combination of flecainide and Ranexa and his  QRS duration is stable we will continue him on his current medications.  Agree with that with his blood pressure and his body habitus sleep testing is reasonable; I guess was last done about 6 years ago and Dr. Macon Large at [facility] has recommended a repeat. I would concur. I will be in touch with his PCP.  His blood pressure remains poorly controlled. I will have him double his diltiazem and/or benazepril for a month and see if we can get it into the 130s; In the event that the first one doesn't accomplish our goal, I will have him revert back to the standard dose and double the other one.  We spent more than 50% of our >25 min visit in face  to face counseling regarding the above

## 2014-12-22 NOTE — Patient Instructions (Signed)
Medication Instructions:  Your physician recommends that you continue on your current medications as directed. Please refer to the Current Medication list given to you today.   Labwork: none  Testing/Procedures: none  Follow-Up: Your physician wants you to follow-up in: six months with Dr. Klein.  You will receive a reminder letter in the mail two months in advance. If you don't receive a letter, please call our office to schedule the follow-up appointment.   Any Other Special Instructions Will Be Listed Below (If Applicable).   

## 2014-12-28 ENCOUNTER — Other Ambulatory Visit: Payer: Self-pay | Admitting: *Deleted

## 2014-12-28 MED ORDER — HYDROCHLOROTHIAZIDE 25 MG PO TABS: 25.0000 mg | ORAL_TABLET | Freq: Every day | ORAL | Status: DC

## 2014-12-28 NOTE — Telephone Encounter (Signed)
°  1. Which medications need to be refilled? HTC  a day (it was changed recently)  2. Which pharmacy is medication to be sent to? [facility] [place]   3. Do they need a 30 day or 90 day supply? 90   4. Would they like a call back once the medication has been sent to the pharmacy? No

## 2014-12-28 NOTE — Telephone Encounter (Signed)
Refill sent for HCTZ 25 mg 90 day supply.

## 2015-01-05 ENCOUNTER — Telehealth: Payer: Self-pay

## 2015-01-05 NOTE — Telephone Encounter (Signed)
Ranexa 500 mg at front desk for pick up.

## 2015-01-05 NOTE — Telephone Encounter (Signed)
Pt needs Ranexa samples. Please call.

## 2015-01-17 ENCOUNTER — Other Ambulatory Visit: Payer: Self-pay | Admitting: Family Medicine

## 2015-04-05 ENCOUNTER — Other Ambulatory Visit: Payer: Self-pay | Admitting: *Deleted

## 2015-04-05 MED ORDER — RANOLAZINE ER 500 MG PO TB12: ORAL_TABLET | ORAL | Status: DC

## 2015-04-05 MED ORDER — FLECAINIDE ACETATE 100 MG PO TABS: 100.0000 mg | ORAL_TABLET | Freq: Two times a day (BID) | ORAL | Status: DC

## 2015-05-05 ENCOUNTER — Ambulatory Visit (INDEPENDENT_AMBULATORY_CARE_PROVIDER_SITE_OTHER): Payer: BLUE CROSS/BLUE SHIELD | Admitting: Family Medicine

## 2015-05-05 ENCOUNTER — Encounter: Payer: Self-pay | Admitting: Family Medicine

## 2015-05-05 VITALS — BP 160/90 | HR 53 | Resp 16 | Ht 72.0 in | Wt 296.4 lb

## 2015-05-05 DIAGNOSIS — I10 Essential (primary) hypertension: Secondary | ICD-10-CM

## 2015-05-05 DIAGNOSIS — I480 Paroxysmal atrial fibrillation: Secondary | ICD-10-CM

## 2015-05-05 DIAGNOSIS — Z0000 Encounter for general adult medical examination without abnormal findings: Secondary | ICD-10-CM

## 2015-05-05 DIAGNOSIS — N521 Erectile dysfunction due to diseases classified elsewhere: Secondary | ICD-10-CM | POA: Diagnosis not present

## 2015-05-05 DIAGNOSIS — E669 Obesity, unspecified: Secondary | ICD-10-CM

## 2015-05-05 DIAGNOSIS — E7800 Pure hypercholesterolemia, unspecified: Secondary | ICD-10-CM | POA: Diagnosis not present

## 2015-05-05 DIAGNOSIS — N529 Male erectile dysfunction, unspecified: Secondary | ICD-10-CM | POA: Insufficient documentation

## 2015-05-05 MED ORDER — BENAZEPRIL HCL 20 MG PO TABS
20.0000 mg | ORAL_TABLET | Freq: Two times a day (BID) | ORAL | Status: DC
Start: 2015-05-05 — End: 2015-07-15

## 2015-05-05 MED ORDER — TADALAFIL 10 MG PO TABS: 10.0000 mg | ORAL_TABLET | Freq: Every day | ORAL | Status: DC | PRN

## 2015-05-05 NOTE — Addendum Note (Signed)
Addended by: Alease FrameARTER, Arseniy Toomey S on: 05/05/2015 10:58 AM   Modules accepted: Orders

## 2015-05-05 NOTE — Progress Notes (Signed)
Name: Jose MantisDavid B Garcia   MRN: [id number]    DOB: 08-Apr-1961   Date:05/05/2015       Progress Note  Subjective  Chief Complaint  Chief Complaint  Patient presents with  . Annual Exam    HPI Here for annual complete physical.  Has HBP and Atr. Fib.  He is in sinus rhythm now with 2 medications.  Followed by Dr. Clide CliffKline (Card).  BP at home in 130s sys.  Trying to lose weight.  Oerall feels well.  Dr. Clide CliffKline recommends another sleep study b/o obesityhand atr fib.  Would like Cialis for ED.  Decreased erectionsNo problem-specific assessment & plan notes found for this encounter.   Past Medical History  Diagnosis Date  . Hypertension 03/03/96  . Hyperlipidemia   . Atrial fibrillation (HCC)   . Cardiomyopathy     Resolved 05/03/09    Past Surgical History  Procedure Laterality Date  . Cardioversion      Family History  Problem Relation Age of Onset  . Hypertension Mother   . Hypertension Father   . Diabetes Neg Hx   . Coronary artery disease Neg Hx   . Cancer Neg Hx     Colon or prostate    Social History   Social History  . Marital Status: Married    Spouse Name: N/A  . Number of Children: 2  . Years of Education: N/A   Occupational History  . 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 Smoking status: Never Smoker   . Smokeless tobacco: Never Used  . Alcohol Use: 0.0 oz/week    0 Standard drinks or equivalent per week     Comment: Rare  . Drug Use: No  . Sexual Activity: Not on file   Other Topics Concern  . Not on file   Social History Narrative   Pleas Kochravels a lot.     Current outpatient prescriptions:  .  benazepril (LOTENSIN) 20 MG tablet, Take 1 tablet (20 mg total) by mouth 2 (two) times daily., Disp: 60 tablet, Rfl: 12 .  diltiazem (CARDIZEM CD) 120 MG 24 hr capsule, Take by mouth., Disp: , Rfl:  .  flecainide (TAMBOCOR) 100 MG tablet, Take by mouth., Disp: , Rfl:  .  hydrochlorothiazide (HYDRODIURIL) 25 MG tablet, Take by  mouth., Disp: , Rfl:  .  Multiple Vitamin (MULTIVITAMIN WITH MINERALS) TABS tablet, Take 1 tablet by mouth daily., Disp: , Rfl:  .  Omega-3 Fatty Acids (FISH OIL) 1000 MG CAPS, Take 1 capsule by mouth daily. , Disp: , Rfl:  .  ranolazine (RANEXA) 500 MG 12 hr tablet, Take by mouth., Disp: , Rfl:  .  tadalafil (CIALIS) 10 MG tablet, Take 1 tablet (10 mg total) by mouth daily as needed for erectile dysfunction (Take as needed before sexual activity.)., Disp: 10 tablet, Rfl: 6  Allergies  Allergen Reactions  . Shellfish Allergy Itching     Review of Systems  Constitutional: Negative for fever, chills, weight loss and malaise/fatigue.  HENT: Negative for congestion and hearing loss.   Eyes: Negative for blurred vision, double vision and pain.  Respiratory: Negative for cough, sputum production, shortness of breath and wheezing.   Cardiovascular: Negative for chest pain, palpitations, orthopnea and leg swelling.  Gastrointestinal: Negative for heartburn, nausea, vomiting, abdominal pain, diarrhea, constipation and blood in stool.  Genitourinary: Negative for dysuria, urgency and frequency.  Musculoskeletal: Negative for myalgias and joint pain.  Skin: Negative for rash.  Neurological: Negative for dizziness, tingling, tremors, sensory change, focal weakness, weakness and headaches.  Psychiatric/Behavioral: Negative for depression. The patient is not nervous/anxious and does not have insomnia.       Objective  Filed Vitals:   05/05/15 0902 05/05/15 1008  BP: 156/81 160/90  Pulse: 53   Resp: 16   Height: 6' (1.829 m)   Weight: 296 lb 6.4 oz (134.446 kg)     Physical Exam  Constitutional: He is well-developed, well-nourished, and in no distress. No distress.  HENT:  Head: Normocephalic and atraumatic.  Right Ear: External ear normal.  Left Ear: External ear normal.  Nose: Nose normal.  Mouth/Throat: Oropharynx is clear and moist. No oropharyngeal exudate.  Eyes: Conjunctivae  and EOM are normal. Pupils are equal, round, and reactive to light. Right eye exhibits no discharge. Left eye exhibits no discharge. No scleral icterus.  Fundoscopic exam:      The right eye shows no arteriolar narrowing, no AV nicking, no exudate, no hemorrhage and no papilledema.       The left eye shows no arteriolar narrowing, no AV nicking, no exudate, no hemorrhage and no papilledema.  Neck: Normal range of motion. Neck supple. Carotid bruit is not present. No thyromegaly present.  Cardiovascular: Normal rate, regular rhythm, normal heart sounds and intact distal pulses.  Exam reveals no gallop and no friction rub.   No murmur heard. Pulmonary/Chest: Effort normal and breath sounds normal. No respiratory distress. He has no wheezes. He has no rales. He exhibits no tenderness.  Abdominal: Soft. Bowel sounds are normal. He exhibits no distension, no abdominal bruit and no mass. There is no tenderness.  Obese  Genitourinary: Penis normal. No discharge found.  Musculoskeletal: Normal range of motion. He exhibits no edema or tenderness.  Lymphadenopathy:    He has no cervical adenopathy.  Neurological: He is alert. No cranial nerve deficit.  Skin: Skin is warm and dry.  Psychiatric: Mood, memory, affect and judgment normal.  Vitals reviewed.      No results found for this or any previous visit (from the past 2160 hour(s)).   Assessment & Plan  Problem List Items Addressed This Visit      Cardiovascular and Mediastinum   HTN (hypertension)   Relevant Medications   diltiazem (CARDIZEM CD) 120 MG 24 hr capsule   flecainide (TAMBOCOR) 100 MG tablet   hydrochlorothiazide (HYDRODIURIL) 25 MG tablet   ranolazine (RANEXA) 500 MG 12 hr tablet   tadalafil (CIALIS) 10 MG tablet   benazepril (LOTENSIN) 20 MG tablet   Other Relevant Orders   Comprehensive Metabolic Panel (CMET)   ATRIAL FIBRILLATION   Relevant Medications   diltiazem (CARDIZEM CD) 120 MG 24 hr capsule   flecainide  (TAMBOCOR) 100 MG tablet   hydrochlorothiazide (HYDRODIURIL) 25 MG tablet   ranolazine (RANEXA) 500 MG 12 hr tablet   tadalafil (CIALIS) 10 MG tablet   benazepril (LOTENSIN) 20 MG tablet   Other Relevant Orders   Ambulatory referral to Sleep Studies   Comprehensive Metabolic Panel (CMET)   CBC with Differential   TSH     Genitourinary   ED (erectile dysfunction)   Relevant Medications   tadalafil (CIALIS) 10 MG tablet     Other   HYPERCHOLESTEROLEMIA   Relevant Medications   diltiazem (CARDIZEM CD) 120 MG 24 hr capsule   flecainide (TAMBOCOR) 100 MG tablet   hydrochlorothiazide (HYDRODIURIL) 25 MG tablet   ranolazine (RANEXA) 500 MG 12 hr tablet   tadalafil (CIALIS) 10  MG tablet   benazepril (LOTENSIN) 20 MG tablet   Other Relevant Orders   Lipid Profile   Obesity   Relevant Orders   Ambulatory referral to Sleep Studies   Annual physical exam - Primary   Relevant Orders   PSA      Meds ordered this encounter  Medications  . DISCONTD: DHA-EPA-VITAMIN E PO    Sig: Take by mouth.  . DISCONTD: apixaban (ELIQUIS) 5 MG TABS tablet    Sig: Take by mouth.  . DISCONTD: benazepril (LOTENSIN) 20 MG tablet    Sig: Take by mouth.  . diltiazem (CARDIZEM CD) 120 MG 24 hr capsule    Sig: Take by mouth.  . flecainide (TAMBOCOR) 100 MG tablet    Sig: Take by mouth.  . hydrochlorothiazide (HYDRODIURIL) 25 MG tablet    Sig: Take by mouth.  . ranolazine (RANEXA) 500 MG 12 hr tablet    Sig: Take by mouth.  . tadalafil (CIALIS) 10 MG tablet    Sig: Take 1 tablet (10 mg total) by mouth daily as needed for erectile dysfunction (Take as needed before sexual activity.).    Dispense:  10 tablet    Refill:  6  . benazepril (LOTENSIN) 20 MG tablet    Sig: Take 1 tablet (20 mg total) by mouth 2 (two) times daily.    Dispense:  60 tablet    Refill:  12    1. Annual physical exam  - PSA  2. Obesity  - Ambulatory referral to Sleep Studies  3. Paroxysmal atrial fibrillation  (HCC)  - Ambulatory referral to Sleep Studies - Comprehensive Metabolic Panel (CMET) - CBC with Differential - TSH  4. Essential hypertension  - Comprehensive Metabolic Panel (CMET) - benazepril (LOTENSIN) 20 MG tablet; Take 1 tablet (20 mg total) by mouth 2 (two) times daily.  Dispense: 60 tablet; Refill: 12  5. HYPERCHOLESTEROLEMIA  - Lipid Profile  6. Erectile dysfunction due to diseases classified elsewhere  - tadalafil (CIALIS) 10 MG tablet; Take 1 tablet (10 mg total) by mouth daily as needed for erectile dysfunction (Take as needed before sexual activity.).  Dispense: 10 tablet; Refill: 6

## 2015-05-05 NOTE — Patient Instructions (Signed)
To get flu shot at pharmacy

## 2015-05-18 ENCOUNTER — Ambulatory Visit: Payer: BLUE CROSS/BLUE SHIELD | Attending: Internal Medicine

## 2015-05-18 DIAGNOSIS — G4733 Obstructive sleep apnea (adult) (pediatric): Secondary | ICD-10-CM | POA: Insufficient documentation

## 2015-05-26 ENCOUNTER — Other Ambulatory Visit: Payer: Self-pay | Admitting: Family Medicine

## 2015-05-26 DIAGNOSIS — G4733 Obstructive sleep apnea (adult) (pediatric): Secondary | ICD-10-CM

## 2015-07-01 LAB — CBC WITH DIFFERENTIAL/PLATELET
BASOS: 0 %
Basophils Absolute: 0 10*3/uL (ref 0.0–0.2)
EOS (ABSOLUTE): 0 10*3/uL (ref 0.0–0.4)
EOS: 1 %
HEMATOCRIT: 40 % (ref 37.5–51.0)
Hemoglobin: 14.3 g/dL (ref 12.6–17.7)
IMMATURE GRANS (ABS): 0 10*3/uL (ref 0.0–0.1)
Immature Granulocytes: 0 %
LYMPHS: 23 %
Lymphocytes Absolute: 1.2 10*3/uL (ref 0.7–3.1)
MCH: 32.5 pg (ref 26.6–33.0)
MCHC: 35.8 g/dL — ABNORMAL HIGH (ref 31.5–35.7)
MCV: 91 fL (ref 79–97)
Monocytes Absolute: 0.4 10*3/uL (ref 0.1–0.9)
Monocytes: 8 %
NEUTROS ABS: 3.5 10*3/uL (ref 1.4–7.0)
Neutrophils: 68 %
PLATELETS: 164 10*3/uL (ref 150–379)
RBC: 4.4 x10E6/uL (ref 4.14–5.80)
RDW: 11.7 % — ABNORMAL LOW (ref 12.3–15.4)
WBC: 5.1 10*3/uL (ref 3.4–10.8)

## 2015-07-02 LAB — COMPREHENSIVE METABOLIC PANEL
A/G RATIO: 2 (ref 1.1–2.5)
ALK PHOS: 39 IU/L (ref 39–117)
ALT: 22 IU/L (ref 0–44)
AST: 16 IU/L (ref 0–40)
Albumin: 4.3 g/dL (ref 3.5–5.5)
BUN/Creatinine Ratio: 20 (ref 9–20)
BUN: 20 mg/dL (ref 6–24)
Bilirubin Total: 0.3 mg/dL (ref 0.0–1.2)
CALCIUM: 9.2 mg/dL (ref 8.7–10.2)
CHLORIDE: 103 mmol/L (ref 96–106)
CO2: 22 mmol/L (ref 18–29)
Creatinine, Ser: 1 mg/dL (ref 0.76–1.27)
GFR calc Af Amer: 98 mL/min/{1.73_m2} (ref >59)
GFR, EST NON AFRICAN AMERICAN: 85 mL/min/{1.73_m2} (ref >59)
Globulin, Total: 2.2 g/dL (ref 1.5–4.5)
Glucose: 102 mg/dL — ABNORMAL HIGH (ref 65–99)
POTASSIUM: 4.3 mmol/L (ref 3.5–5.2)
Sodium: 141 mmol/L (ref 134–144)
Total Protein: 6.5 g/dL (ref 6.0–8.5)

## 2015-07-02 LAB — LIPID PANEL
CHOLESTEROL TOTAL: 206 mg/dL — AB (ref 100–199)
Chol/HDL Ratio: 4.8 ratio units (ref 0.0–5.0)
HDL: 43 mg/dL (ref >39)
LDL Calculated: 148 mg/dL — ABNORMAL HIGH (ref 0–99)
TRIGLYCERIDES: 74 mg/dL (ref 0–149)
VLDL Cholesterol Cal: 15 mg/dL (ref 5–40)

## 2015-07-02 LAB — TSH: TSH: 1.88 u[IU]/mL (ref 0.450–4.500)

## 2015-07-02 LAB — PSA: Prostate Specific Ag, Serum: 0.6 ng/mL (ref 0.0–4.0)

## 2015-07-07 ENCOUNTER — Ambulatory Visit: Payer: BLUE CROSS/BLUE SHIELD | Admitting: Family Medicine

## 2015-07-08 LAB — HGB A1C W/O EAG: Hgb A1c MFr Bld: 5.3 % (ref 4.8–5.6)

## 2015-07-08 LAB — SPECIMEN STATUS REPORT

## 2015-07-15 ENCOUNTER — Encounter: Payer: Self-pay | Admitting: Internal Medicine

## 2015-07-15 ENCOUNTER — Ambulatory Visit (INDEPENDENT_AMBULATORY_CARE_PROVIDER_SITE_OTHER): Payer: BLUE CROSS/BLUE SHIELD | Admitting: Internal Medicine

## 2015-07-15 VITALS — BP 156/72 | HR 69 | Ht 72.0 in | Wt 303.2 lb

## 2015-07-15 DIAGNOSIS — I4891 Unspecified atrial fibrillation: Secondary | ICD-10-CM | POA: Diagnosis not present

## 2015-07-15 DIAGNOSIS — E782 Mixed hyperlipidemia: Secondary | ICD-10-CM

## 2015-07-15 DIAGNOSIS — I10 Essential (primary) hypertension: Secondary | ICD-10-CM | POA: Diagnosis not present

## 2015-07-15 MED ORDER — BENAZEPRIL HCL 20 MG PO TABS: 20.0000 mg | ORAL_TABLET | Freq: Two times a day (BID) | ORAL | Status: DC

## 2015-07-15 MED ORDER — RANOLAZINE ER 500 MG PO TB12: 500.0000 mg | ORAL_TABLET | Freq: Two times a day (BID) | ORAL | Status: DC

## 2015-07-15 MED ORDER — FLECAINIDE ACETATE 100 MG PO TABS: 100.0000 mg | ORAL_TABLET | Freq: Two times a day (BID) | ORAL | Status: DC

## 2015-07-15 MED ORDER — HYDROCHLOROTHIAZIDE 25 MG PO TABS: 25.0000 mg | ORAL_TABLET | Freq: Every day | ORAL | Status: DC

## 2015-07-15 MED ORDER — DILTIAZEM HCL ER COATED BEADS 120 MG PO CP24: 120.0000 mg | ORAL_CAPSULE | Freq: Every day | ORAL | Status: DC

## 2015-07-15 NOTE — Patient Instructions (Signed)
Medication Instructions: - no changes  Labwork: - Your physician recommends that you return for a FASTING lipid profile: in 6 months- just prior to your follow up with Dr. Graciela HusbandsKlein.  Procedures/Testing: - no changes  Follow-Up: - Your physician wants you to follow-up in: 6 months with Dr. Graciela HusbandsKlein. You will receive a reminder letter in the mail two months in advance. If you don't receive a letter, please call our office to schedule the follow-up appointment.  Any Additional Special Instructions Will Be Listed Below (If Applicable).

## 2015-07-15 NOTE — Progress Notes (Signed)
Patient Care Team: Janeann ForehandJames H Hawkins Jr., MD as PCP - General (Family Medicine)   HPI  [name] is a 55 y.o. male Is seen as an add-on today. He has a history of paroxysmal atrial fibrillation associated with a transient cardiomyopathy. he has a history of treated hypertension; he takes aspirin with a CHADS-VASc score of one  He came in early Feb with AFib >>TEEDCCV  he required repeat cardioversion 11/15. He was subtotally started on flecainide and underwent repeat cardioversion again 1/16  with early reversion to atrial fibrillation ; was also noted to have post cardioversion bradycardia. His metoprolol was decreased and his flecainide was increased. Cardioversion was not undertaken.  He has reverted to sinus rhythm.  Date                QRSd dose  6/15 94 o  2/16 104 100   3/16 116 150 bid  6/16 116 100           Last ejection fraction TEE 2/15 50-55%  echocardiogram 3/16 demonstrated a left atrial dimension of 47/1.7  Myoview scan 1/16 demonstrated no perfusion abnormalities and normal LV function  At his last visit we decreased the flecainide because of increasing QRS width  [facility]  reviewed  These records were reviewed.  Cholesterol levels 12/16 demonstrated an LDL of 148 HDL 43 total cholesterol 206. (See below)  The patient denies chest pain, shortness of breath, nocturnal dyspnea, orthopnea or peripheral edema.  There have been no palpitations, lightheadedness or syncope.       Past Medical History  Diagnosis Date  . Hypertension 03/03/96  . Hyperlipidemia   . Atrial fibrillation (HCC)   . Cardiomyopathy     Resolved 05/03/09    Past Surgical History  Procedure Laterality Date  . Cardioversion      Current Outpatient Prescriptions  Medication Sig Dispense Refill  . benazepril (LOTENSIN) 20 MG tablet Take 1 tablet (20 mg total) by mouth 2 (two) times daily. 60 tablet 12  . diltiazem (CARDIZEM CD) 120 MG 24 hr  capsule Take 120 mg by mouth daily.     . flecainide (TAMBOCOR) 100 MG tablet Take 100 mg by mouth 2 (two) times daily.     . hydrochlorothiazide (HYDRODIURIL) 25 MG tablet Take 25 mg by mouth daily.     . Multiple Vitamin (MULTIVITAMIN WITH MINERALS) TABS tablet Take 1 tablet by mouth daily.    . Omega-3 Fatty Acids (FISH OIL) 1000 MG CAPS Take 1 capsule by mouth daily.     . ranolazine (RANEXA) 500 MG 12 hr tablet Take 500 mg by mouth 2 (two) times daily.     . tadalafil (CIALIS) 10 MG tablet Take 1 tablet (10 mg total) by mouth daily as needed for erectile dysfunction (Take as needed before sexual activity.). 10 tablet 6   No current facility-administered medications for this visit.    Allergies  Allergen Reactions  . Shellfish Allergy Itching    Review of Systems negative except from HPI and PMH  Physical Exam BP 156/72 mmHg  Pulse 69  Ht 6' (1.829 m)  Wt 303 lb 4 oz (137.553 kg)  BMI 41.12 kg/m2  Well developed and nourished in no acute distress HENT normal Neck supple with JVP-6-7 Clear RRR M+S4 Abd-soft with active BS No Clubbing cyanosis edema Skin-warm and dry A & Oriented  Grossly normal sensory and motor function  ECG today  demonstrated sinus rhythm at 61 Intervals 18/12  (116)/ 42  Assessment and  Plan Atrial fibrillation paroxysmal RVR  Hypertension  Hyperlipidemia  Obesity  He has had no recurrent atrial fibrillation on a combination of flecainide and Ranexa and his QRS duration is stable we will continue him on his current medications.  We have had a discussion regarding NOACs. While his CHADS-VASc score is 1, it is related to age. Based on the [place] registry study the importance of hypertension as a risk is relatively modest in isolation; hence we have decided to continue without anticoagulation. We have reviewed his cholesterol levels. His 10 year cardiovascular risk is 9.5%. Recommendations would be that he would be on moderate-high dose statin  therapy; he would like to continue his exercise diet regime reassess risk in about 6 months. We will see him at that time with antecedent lipids.  More than 50% of 45 min was spent in counseling related to the above]

## 2015-08-11 ENCOUNTER — Other Ambulatory Visit: Payer: Self-pay | Admitting: Internal Medicine

## 2015-09-27 ENCOUNTER — Ambulatory Visit (INDEPENDENT_AMBULATORY_CARE_PROVIDER_SITE_OTHER): Payer: BLUE CROSS/BLUE SHIELD | Admitting: Family Medicine

## 2015-09-27 ENCOUNTER — Encounter: Payer: Self-pay | Admitting: Family Medicine

## 2015-09-27 VITALS — BP 128/70 | HR 55 | Temp 98.6°F | Resp 16 | Ht 72.0 in | Wt 283.6 lb

## 2015-09-27 DIAGNOSIS — E7800 Pure hypercholesterolemia, unspecified: Secondary | ICD-10-CM

## 2015-09-27 DIAGNOSIS — I480 Paroxysmal atrial fibrillation: Secondary | ICD-10-CM

## 2015-09-27 DIAGNOSIS — E669 Obesity, unspecified: Secondary | ICD-10-CM | POA: Diagnosis not present

## 2015-09-27 DIAGNOSIS — I10 Essential (primary) hypertension: Secondary | ICD-10-CM

## 2015-09-27 DIAGNOSIS — I429 Cardiomyopathy, unspecified: Secondary | ICD-10-CM

## 2015-09-27 NOTE — Progress Notes (Signed)
Name: Jose Garcia   MRN: [id number]    DOB: [birth year]   Date:09/27/2015       Progress Note  Subjective  Chief Complaint  Chief Complaint  Patient presents with  . Hyperlipidemia    follow up for lab work    HPI Here for f/u of hyperlipidemia.   Has elevated LDL of 148   And total Chol of 206.  He has had hx of A fib, but in NSR now.  Has been dieting and losing weight (20#  In 3 months).   He wants to wait a little longer before repeating lipids and deciding on Statin. No problem-specific assessment & plan notes found for this encounter.   Past Medical History  Diagnosis Date  . Hypertension 03/03/96  . Hyperlipidemia   . Atrial fibrillation (HCC)   . Cardiomyopathy     Resolved 05/03/09    Past Surgical History  Procedure Laterality Date  . Cardioversion      Family History  Problem Relation Age of Onset  . Hypertension Mother   . Hypertension Father   . Diabetes Neg Hx   . Coronary artery disease Neg Hx   . Cancer Neg Hx     Colon or prostate    Social History   Social History  . Marital Status: Married    Spouse Name: N/A  . Number of Children: 2  . Years of Education: N/A   Occupational History  . 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 Smoking status: Never Smoker   . Smokeless tobacco: Never Used  . Alcohol Use: 0.0 oz/week    0 Standard drinks or equivalent per week     Comment: Rare  . Drug Use: No  . Sexual Activity: Not on file   Other Topics Concern  . Not on file   Social History Narrative   Pleas Koch a lot.     Current outpatient prescriptions:  .  benazepril (LOTENSIN) 20 MG tablet, Take 1 tablet (20 mg total) by mouth 2 (two) times daily., Disp: 60 tablet, Rfl: 6 .  diltiazem (CARDIZEM CD) 120 MG 24 hr capsule, Take 1 capsule (120 mg total) by mouth daily., Disp: 30 capsule, Rfl: 6 .  flecainide (TAMBOCOR) 100 MG tablet, Take 1 tablet (100 mg total) by mouth 2 (two) times daily., Disp: 60 tablet,  Rfl: 6 .  hydrochlorothiazide (HYDRODIURIL) 25 MG tablet, Take 1 tablet (25 mg total) by mouth daily., Disp: 30 tablet, Rfl: 6 .  Multiple Vitamin (MULTIVITAMIN WITH MINERALS) TABS tablet, Take 1 tablet by mouth daily., Disp: , Rfl:  .  Omega-3 Fatty Acids (FISH OIL) 1000 MG CAPS, Take 1 capsule by mouth daily. , Disp: , Rfl:  .  ranolazine (RANEXA) 500 MG 12 hr tablet, Take 1 tablet (500 mg total) by mouth 2 (two) times daily., Disp: 60 tablet, Rfl: 3 .  tadalafil (CIALIS) 10 MG tablet, Take 1 tablet (10 mg total) by mouth daily as needed for erectile dysfunction (Take as needed before sexual activity.)., Disp: 10 tablet, Rfl: 6  Allergies  Allergen Reactions  . Shellfish Allergy Itching     Review of Systems  Constitutional: Negative for fever, chills, weight loss and diaphoresis.  HENT: Negative for hearing loss.   Eyes: Negative for blurred vision and double vision.  Respiratory: Negative for cough, shortness of breath and wheezing.   Cardiovascular: Positive for leg swelling (a little). Negative for chest pain and palpitations.  Gastrointestinal: Negative for heartburn, abdominal pain and blood in stool.  Genitourinary: Negative for dysuria, urgency and frequency.  Musculoskeletal: Negative for myalgias and joint pain.  Skin: Negative for rash.  Neurological: Negative for tremors, weakness and headaches.      Objective  Filed Vitals:   09/27/15 0818  BP: 128/70  Pulse: 55  Temp: 98.6 F (37 C)  TempSrc: Oral  Resp: 16  Height: 6' (1.829 m)  Weight: 283 lb 9.6 oz (128.64 kg)    Physical Exam  Constitutional: He is oriented to person, place, and time and well-developed, well-nourished, and in no distress. No distress.  HENT:  Head: Normocephalic and atraumatic.  Eyes: Conjunctivae and EOM are normal. Pupils are equal, round, and reactive to light. No scleral icterus.  Neck: Normal range of motion. Carotid bruit is not present. No thyromegaly present.   Cardiovascular: Normal rate, regular rhythm and normal heart sounds.  Exam reveals no gallop and no friction rub.   No murmur heard. Pulmonary/Chest: Effort normal and breath sounds normal. No respiratory distress. He has no wheezes. He has no rales.  Abdominal: He exhibits no distension, no abdominal bruit and no mass. There is no tenderness.  Musculoskeletal: He exhibits edema. Tenderness: trace bilateral pedal edema.  Lymphadenopathy:    He has no cervical adenopathy.  Neurological: He is alert and oriented to person, place, and time.  Vitals reviewed.      Recent Results (from the past 2160 hour(s))  PSA     Status: None   Collection Time: 07/01/15 10:01 AM  Result Value Ref Range   Prostate Specific Ag, Serum 0.6 0.0 - 4.0 ng/mL    Comment: Roche ECLIA methodology. According to the American Urological Association, Serum PSA should decrease and remain at undetectable levels after radical prostatectomy. The AUA defines biochemical recurrence as an initial PSA value 0.2 ng/mL or greater followed by a subsequent confirmatory PSA value 0.2 ng/mL or greater. Values obtained with different assay methods or kits cannot be used interchangeably. Results cannot be interpreted as absolute evidence of the presence or absence of malignant disease.   Comprehensive Metabolic Panel (CMET)     Status: Abnormal   Collection Time: 07/01/15 10:02 AM  Result Value Ref Range   Glucose 102 (H) 65 - 99 mg/dL   BUN 20 6 - 24 mg/dL   Creatinine, Ser 1.61 0.76 - 1.27 mg/dL   GFR calc non Af Amer 85 >59 mL/min/1.73   GFR calc Af Amer 98 >59 mL/min/1.73   BUN/Creatinine Ratio 20 9 - 20   Sodium 141 134 - 144 mmol/L   Potassium 4.3 3.5 - 5.2 mmol/L   Chloride 103 96 - 106 mmol/L   CO2 22 18 - 29 mmol/L   Calcium 9.2 8.7 - 10.2 mg/dL   Total Protein 6.5 6.0 - 8.5 g/dL   Albumin 4.3 3.5 - 5.5 g/dL   Globulin, Total 2.2 1.5 - 4.5 g/dL   Albumin/Globulin Ratio 2.0 1.1 - 2.5   Bilirubin Total 0.3  0.0 - 1.2 mg/dL   Alkaline Phosphatase 39 39 - 117 IU/L   AST 16 0 - 40 IU/L   ALT 22 0 - 44 IU/L  Lipid Profile     Status: Abnormal   Collection Time: 07/01/15 10:02 AM  Result Value Ref Range   Cholesterol, Total 206 (H) 100 - 199 mg/dL   Triglycerides 74 0 - 149 mg/dL   HDL 43 >09 mg/dL   VLDL Cholesterol Cal 15 5 - 40  mg/dL   LDL Calculated 161148 (H) 0 - 99 mg/dL   Chol/HDL Ratio 4.8 0.0 - 5.0 ratio units    Comment:                                   T. Chol/HDL Ratio                                             Men  Women                               1/2 Avg.Risk  3.4    3.3                                   Avg.Risk  5.0    4.4                                2X Avg.Risk  9.6    7.1                                3X Avg.Risk 23.4   11.0   CBC with Differential     Status: Abnormal   Collection Time: 07/01/15 10:02 AM  Result Value Ref Range   WBC 5.1 3.4 - 10.8 x10E3/uL   RBC 4.40 4.14 - 5.80 x10E6/uL   Hemoglobin 14.3 12.6 - 17.7 g/dL   Hematocrit 09.640.0 04.537.5 - 51.0 %   MCV 91 79 - 97 fL   MCH 32.5 26.6 - 33.0 pg   MCHC 35.8 (H) 31.5 - 35.7 g/dL   RDW 40.911.7 (L) 81.112.3 - 91.415.4 %   Platelets 164 150 - 379 x10E3/uL   Neutrophils 68 %   Lymphs 23 %   Monocytes 8 %   Eos 1 %   Basos 0 %   Neutrophils Absolute 3.5 1.4 - 7.0 x10E3/uL   Lymphocytes Absolute 1.2 0.7 - 3.1 x10E3/uL   Monocytes Absolute 0.4 0.1 - 0.9 x10E3/uL   EOS (ABSOLUTE) 0.0 0.0 - 0.4 x10E3/uL   Basophils Absolute 0.0 0.0 - 0.2 x10E3/uL   Immature Granulocytes 0 %   Immature Grans (Abs) 0.0 0.0 - 0.1 x10E3/uL  TSH     Status: None   Collection Time: 07/01/15 10:02 AM  Result Value Ref Range   TSH 1.880 0.450 - 4.500 uIU/mL  Hgb A1c w/o eAG     Status: None   Collection Time: 07/01/15 10:02 AM  Result Value Ref Range   Hgb A1c MFr Bld 5.3 4.8 - 5.6 %    Comment:          Pre-diabetes: 5.7 - 6.4          Diabetes: >6.4          Glycemic control for adults with diabetes: <7.0   Specimen status report      Status: None   Collection Time: 07/01/15 10:02 AM  Result Value Ref Range   specimen status report Comment     Comment: Written Authorization Written Authorization Written Authorization Received. Authorization [facility] from [facility]  CMA 07-08-2015 Logged by Danella Penton      Assessment & Plan  Problem List Items Addressed This Visit      Cardiovascular and Mediastinum   HTN (hypertension) - Primary   ATRIAL FIBRILLATION   Cardiomyopathy (HCC)     Other   HYPERCHOLESTEROLEMIA   Obesity      No orders of the defined types were placed in this encounter.   1. Essential hypertension Cont. Benazepril, Cardizem and HCTZ 2. Cardiomyopathy (HCC)   3. HYPERCHOLESTEROLEMIA Cont low fat diet and weight loss.- RTC-3 mon. 4. Paroxysmal atrial fibrillation (HCC) Cont Flecainide and ranolazine.  5. Obesity

## 2015-09-27 NOTE — Patient Instructions (Signed)
Get lipid panel PTR.

## 2015-10-19 DIAGNOSIS — G4733 Obstructive sleep apnea (adult) (pediatric): Secondary | ICD-10-CM | POA: Diagnosis not present

## 2015-11-12 DIAGNOSIS — M542 Cervicalgia: Secondary | ICD-10-CM | POA: Diagnosis not present

## 2015-11-12 DIAGNOSIS — M8588 Other specified disorders of bone density and structure, other site: Secondary | ICD-10-CM | POA: Diagnosis not present

## 2015-12-13 DIAGNOSIS — M4810 Ankylosing hyperostosis [Forestier], site unspecified: Secondary | ICD-10-CM | POA: Diagnosis not present

## 2015-12-13 DIAGNOSIS — M546 Pain in thoracic spine: Secondary | ICD-10-CM | POA: Diagnosis not present

## 2015-12-13 DIAGNOSIS — M4819 Ankylosing hyperostosis [Forestier], multiple sites in spine: Secondary | ICD-10-CM | POA: Diagnosis not present

## 2015-12-13 DIAGNOSIS — M47812 Spondylosis without myelopathy or radiculopathy, cervical region: Secondary | ICD-10-CM | POA: Diagnosis not present

## 2015-12-13 DIAGNOSIS — M545 Low back pain: Secondary | ICD-10-CM | POA: Diagnosis not present

## 2015-12-16 DIAGNOSIS — M47812 Spondylosis without myelopathy or radiculopathy, cervical region: Secondary | ICD-10-CM | POA: Diagnosis not present

## 2015-12-22 DIAGNOSIS — M6281 Muscle weakness (generalized): Secondary | ICD-10-CM | POA: Diagnosis not present

## 2015-12-22 DIAGNOSIS — M47812 Spondylosis without myelopathy or radiculopathy, cervical region: Secondary | ICD-10-CM | POA: Diagnosis not present

## 2015-12-27 ENCOUNTER — Other Ambulatory Visit (INDEPENDENT_AMBULATORY_CARE_PROVIDER_SITE_OTHER): Payer: BLUE CROSS/BLUE SHIELD | Admitting: *Deleted

## 2015-12-27 DIAGNOSIS — E782 Mixed hyperlipidemia: Secondary | ICD-10-CM | POA: Diagnosis not present

## 2015-12-28 LAB — LIPID PANEL
CHOL/HDL RATIO: 4 ratio (ref 0.0–5.0)
Cholesterol, Total: 174 mg/dL (ref 100–199)
HDL: 43 mg/dL (ref >39)
LDL CALC: 118 mg/dL — AB (ref 0–99)
TRIGLYCERIDES: 64 mg/dL (ref 0–149)
VLDL CHOLESTEROL CAL: 13 mg/dL (ref 5–40)

## 2015-12-29 DIAGNOSIS — M6281 Muscle weakness (generalized): Secondary | ICD-10-CM | POA: Diagnosis not present

## 2015-12-29 DIAGNOSIS — M545 Low back pain: Secondary | ICD-10-CM | POA: Diagnosis not present

## 2015-12-29 DIAGNOSIS — M47812 Spondylosis without myelopathy or radiculopathy, cervical region: Secondary | ICD-10-CM | POA: Diagnosis not present

## 2015-12-29 DIAGNOSIS — G8929 Other chronic pain: Secondary | ICD-10-CM | POA: Diagnosis not present

## 2015-12-29 DIAGNOSIS — M546 Pain in thoracic spine: Secondary | ICD-10-CM | POA: Diagnosis not present

## 2015-12-30 ENCOUNTER — Ambulatory Visit: Payer: BLUE CROSS/BLUE SHIELD | Admitting: Family Medicine

## 2016-01-05 DIAGNOSIS — G8929 Other chronic pain: Secondary | ICD-10-CM | POA: Diagnosis not present

## 2016-01-05 DIAGNOSIS — M47812 Spondylosis without myelopathy or radiculopathy, cervical region: Secondary | ICD-10-CM | POA: Diagnosis not present

## 2016-01-05 DIAGNOSIS — M545 Low back pain: Secondary | ICD-10-CM | POA: Diagnosis not present

## 2016-01-05 DIAGNOSIS — M6281 Muscle weakness (generalized): Secondary | ICD-10-CM | POA: Diagnosis not present

## 2016-01-06 ENCOUNTER — Encounter: Payer: Self-pay | Admitting: Internal Medicine

## 2016-01-06 ENCOUNTER — Ambulatory Visit (INDEPENDENT_AMBULATORY_CARE_PROVIDER_SITE_OTHER): Payer: BLUE CROSS/BLUE SHIELD | Admitting: Internal Medicine

## 2016-01-06 VITALS — BP 148/80 | HR 59 | Ht 72.0 in | Wt 278.0 lb

## 2016-01-06 DIAGNOSIS — I10 Essential (primary) hypertension: Secondary | ICD-10-CM

## 2016-01-06 DIAGNOSIS — E782 Mixed hyperlipidemia: Secondary | ICD-10-CM

## 2016-01-06 DIAGNOSIS — I480 Paroxysmal atrial fibrillation: Secondary | ICD-10-CM | POA: Diagnosis not present

## 2016-01-06 NOTE — Progress Notes (Signed)
Patient Care Team: Janeann ForehandJames H Hawkins Jr., MD as PCP - General (Family Medicine)   HPI  Jose MantisDavid B Garcia is a 55 y.o. male Is seen as an add-on today. He has a history of paroxysmal atrial fibrillation associated with a transient cardiomyopathy. he has a history of treated hypertension; he takes aspirin with a CHADS-VASc score of one  He came in early Feb with AFib >>TEEDCCV  he required repeat cardioversion 11/15. He was subtotally started on flecainide and underwent repeat cardioversion again 1/16  with early reversion to atrial fibrillation ; was also noted to have post cardioversion bradycardia. His metoprolol was decreased and his flecainide was increased. Cardioversion was not undertaken.  He has reverted to sinus rhythm.  Date                QRSd dose  6/15 94 o  2/16 104 100   3/16 116 150 bid  6/16 116 100  7/16 110 100       Last ejection fraction TEE 2/15 50-55%  echocardiogram 3/16 demonstrated a left atrial dimension of 47/1.7  Myoview scan 1/16 demonstrated no perfusion abnormalities and normal LV function  At his last visit we decreased the flecainide because of increasing QRS width  [facility]16  reviewed  These records were reviewed.  Cholesterol levels 12/16 demonstrated an LDL of 148 HDL 43 total cholesterol 206. (See below) Repeat level today were notable for an LDL of 118 (see below)  The patient denies chest pain, shortness of breath, nocturnal dyspnea, orthopnea or peripheral edema.  There have been no palpitations, lightheadedness or syncope.    He has lost now total of 25 pounds since January     Past Medical History  Diagnosis Date  . Hypertension 03/03/96  . Hyperlipidemia   . Atrial fibrillation (HCC)   . Cardiomyopathy     Resolved 05/03/09    Past Surgical History  Procedure Laterality Date  . Cardioversion      Current Outpatient Prescriptions  Medication Sig Dispense Refill  . benazepril (LOTENSIN)  20 MG tablet Take 1 tablet (20 mg total) by mouth 2 (two) times daily. 60 tablet 6  . diltiazem (CARDIZEM CD) 120 MG 24 hr capsule Take 1 capsule (120 mg total) by mouth daily. 30 capsule 6  . flecainide (TAMBOCOR) 100 MG tablet Take 1 tablet (100 mg total) by mouth 2 (two) times daily. 60 tablet 6  . hydrochlorothiazide (HYDRODIURIL) 25 MG tablet Take 1 tablet (25 mg total) by mouth daily. 30 tablet 6  . Multiple Vitamin (MULTIVITAMIN WITH MINERALS) TABS tablet Take 1 tablet by mouth daily.    . Omega-3 Fatty Acids (FISH OIL) 1000 MG CAPS Take 1 capsule by mouth daily.     . ranolazine (RANEXA) 500 MG 12 hr tablet Take 1 tablet (500 mg total) by mouth 2 (two) times daily. 60 tablet 3  . tadalafil (CIALIS) 10 MG tablet Take 1 tablet (10 mg total) by mouth daily as needed for erectile dysfunction (Take as needed before sexual activity.). 10 tablet 6   No current facility-administered medications for this visit.    Allergies  Allergen Reactions  . Shellfish Allergy Itching    Review of Systems negative except from HPI and PMH  Physical Exam BP 148/80 mmHg  Pulse 59  Ht 6' (1.829 m)  Wt 278 lb (126.1 kg)  BMI 37.70 kg/m2  Well developed and nourished in no acute distress  HENT normal Neck supple with JVP  Clear RRR M+S4 Abd-soft with active BS No Clubbing cyanosis edema Skin-warm and dry A & Oriented  Grossly normal sensory and motor function  ECG today demonstrated sinus rhythm at 59 Intervals 1 19/11/48  Assessment and  Plan Atrial fibrillation paroxysmal RVR  Hypertension  Hyperlipidemia  Obesity  He has had no recurrent atrial fibrillation on a combination of flecainide and Ranexa and his QRS duration is stable; we will continue him on his current medications.  We have had a discussion regarding NOACs. While his CHADS-VASc score is 1, it is related to HTN  Based on the [place] registry study the importance of hypertension as a risk is relatively modest in isolation;  hence we have decided to continue without anticoagulation.  We have reviewed his cholesterol levels. His 10 year cardiovascular risk is 5.2%. Recommendations would be that he would be on moderate-high dose statin therapy; we however have decided to continue his exercise and diet regime given the interval benefits. s. We will see him in 6 months with antecedent lipids.  We will also have to check his metabolic profile on hydrochlorothiazide  As he continues to lose weight hope is that his blood pressure comes down.

## 2016-01-06 NOTE — Patient Instructions (Addendum)
Your physician recommends that you schedule a follow-up appointment in: 6 months   Need to have BMET/lipid prior to the 6 month follow up.

## 2016-01-13 DIAGNOSIS — M6281 Muscle weakness (generalized): Secondary | ICD-10-CM | POA: Diagnosis not present

## 2016-01-13 DIAGNOSIS — M47812 Spondylosis without myelopathy or radiculopathy, cervical region: Secondary | ICD-10-CM | POA: Diagnosis not present

## 2016-01-13 DIAGNOSIS — M546 Pain in thoracic spine: Secondary | ICD-10-CM | POA: Diagnosis not present

## 2016-01-24 ENCOUNTER — Other Ambulatory Visit: Payer: Self-pay | Admitting: *Deleted

## 2016-01-24 DIAGNOSIS — E782 Mixed hyperlipidemia: Secondary | ICD-10-CM

## 2016-01-24 DIAGNOSIS — I480 Paroxysmal atrial fibrillation: Secondary | ICD-10-CM

## 2016-01-28 ENCOUNTER — Telehealth: Payer: Self-pay | Admitting: *Deleted

## 2016-01-28 NOTE — Telephone Encounter (Signed)
Called patient and left message PA initiated for Cialis and denied.

## 2016-02-14 ENCOUNTER — Other Ambulatory Visit: Payer: Self-pay | Admitting: Internal Medicine

## 2016-03-30 ENCOUNTER — Other Ambulatory Visit: Payer: Self-pay | Admitting: Internal Medicine

## 2016-06-01 ENCOUNTER — Other Ambulatory Visit: Payer: Self-pay | Admitting: Family Medicine

## 2016-06-01 DIAGNOSIS — I10 Essential (primary) hypertension: Secondary | ICD-10-CM

## 2016-06-13 ENCOUNTER — Other Ambulatory Visit: Payer: Self-pay

## 2016-06-13 MED ORDER — BENAZEPRIL HCL 20 MG PO TABS: 20.0000 mg | ORAL_TABLET | Freq: Two times a day (BID) | ORAL | 6 refills | Status: DC

## 2016-06-13 MED ORDER — RANOLAZINE ER 500 MG PO TB12: 500.0000 mg | ORAL_TABLET | Freq: Two times a day (BID) | ORAL | 3 refills | Status: DC

## 2016-06-13 MED ORDER — FLECAINIDE ACETATE 100 MG PO TABS: 100.0000 mg | ORAL_TABLET | Freq: Two times a day (BID) | ORAL | 6 refills | Status: DC

## 2016-06-13 MED ORDER — DILTIAZEM HCL ER COATED BEADS 120 MG PO CP24: ORAL_CAPSULE | ORAL | 3 refills | Status: DC

## 2016-06-15 ENCOUNTER — Other Ambulatory Visit (INDEPENDENT_AMBULATORY_CARE_PROVIDER_SITE_OTHER): Payer: BLUE CROSS/BLUE SHIELD

## 2016-06-15 DIAGNOSIS — E782 Mixed hyperlipidemia: Secondary | ICD-10-CM | POA: Diagnosis not present

## 2016-06-15 DIAGNOSIS — I10 Essential (primary) hypertension: Secondary | ICD-10-CM

## 2016-06-15 DIAGNOSIS — I480 Paroxysmal atrial fibrillation: Secondary | ICD-10-CM | POA: Diagnosis not present

## 2016-06-16 LAB — BASIC METABOLIC PANEL
BUN/Creatinine Ratio: 13 (ref 9–20)
BUN: 11 mg/dL (ref 6–24)
CALCIUM: 8.8 mg/dL (ref 8.7–10.2)
CO2: 25 mmol/L (ref 18–29)
CREATININE: 0.88 mg/dL (ref 0.76–1.27)
Chloride: 104 mmol/L (ref 96–106)
GFR calc Af Amer: 112 mL/min/{1.73_m2} (ref >59)
GFR, EST NON AFRICAN AMERICAN: 97 mL/min/{1.73_m2} (ref >59)
Glucose: 111 mg/dL — ABNORMAL HIGH (ref 65–99)
Potassium: 4.1 mmol/L (ref 3.5–5.2)
Sodium: 143 mmol/L (ref 134–144)

## 2016-06-16 LAB — LIPID PANEL
CHOL/HDL RATIO: 4.1 ratio (ref 0.0–5.0)
CHOLESTEROL TOTAL: 184 mg/dL (ref 100–199)
HDL: 45 mg/dL (ref >39)
LDL CALC: 124 mg/dL — AB (ref 0–99)
TRIGLYCERIDES: 73 mg/dL (ref 0–149)
VLDL CHOLESTEROL CAL: 15 mg/dL (ref 5–40)

## 2016-06-20 ENCOUNTER — Encounter: Payer: Self-pay | Admitting: Internal Medicine

## 2016-06-20 ENCOUNTER — Ambulatory Visit (INDEPENDENT_AMBULATORY_CARE_PROVIDER_SITE_OTHER): Payer: BLUE CROSS/BLUE SHIELD | Admitting: Internal Medicine

## 2016-06-20 VITALS — BP 140/80 | HR 53 | Ht 72.0 in | Wt 286.2 lb

## 2016-06-20 DIAGNOSIS — I480 Paroxysmal atrial fibrillation: Secondary | ICD-10-CM | POA: Diagnosis not present

## 2016-06-20 NOTE — Patient Instructions (Addendum)

## 2016-06-20 NOTE — Progress Notes (Signed)
1      Patient Care Team: Janeann ForehandJames H Hawkins Jr., MD as PCP - General (Family Medicine)   HPI  Bari MantisDavid B Eissler is a 55 y.o. male Is seen as an add-on today. He has a history of paroxysmal atrial fibrillation associated with a transient cardiomyopathy. he has a history of treated hypertension; he takes aspirin with a CHADS-VASc score of one  He came in early Feb with AFib >>TEEDCCV  he required repeat cardioversion 11/15. He was subtotally started on flecainide and underwent repeat cardioversion again 1/16  with early reversion to atrial fibrillation   was also noted to have post cardioversion bradycardia. His metoprolol was decreased and his flecainide was increased. Cardioversion was not undertaken.  He has reverted to sinus rhythm.  Date                QRSd dose  6/15 94 o  2/16 104 100   3/16 116 150 bid  6/16 116 100  7/16 110 100  12/17` 108 100       Last ejection fraction TEE 2/15 50-55%  echocardiogram 3/16 demonstrated a left atrial dimension of 47/1.7  Myoview scan 1/16 demonstrated no perfusion abnormalities and normal LV function  At his last visit we decreased the flecainide because of increasing QRS width  [facility]  Cholesterol levels 12/16 demonstrated an LDL of 148 HDL 43 total cholesterol 206. (See below) Repeat level today were notable for an LDL of 118 (see below)  The patient denies chest pain, shortness of breath, nocturnal dyspnea, orthopnea or peripheral edema.  There have been lightheadedness or syncope.    He has had a couple of episodes of atrial fibrillation which is gone by morning.     Past Medical History:  Diagnosis Date  . Atrial fibrillation (HCC)   . Cardiomyopathy    Resolved 05/03/09  . Hyperlipidemia   . Hypertension 03/03/96    Past Surgical History:  Procedure Laterality Date  . CARDIOVERSION      Current Outpatient Prescriptions  Medication Sig Dispense Refill  . benazepril (LOTENSIN) 20 MG tablet Take 1  tablet (20 mg total) by mouth 2 (two) times daily. 60 tablet 6  . diltiazem (CARTIA XT) 120 MG 24 hr capsule TAKE 1 CAPSULE(120 MG) BY MOUTH DAILY 30 capsule 3  . flecainide (TAMBOCOR) 100 MG tablet Take 1 tablet (100 mg total) by mouth 2 (two) times daily. 60 tablet 6  . hydrochlorothiazide (HYDRODIURIL) 25 MG tablet Take 1 tablet (25 mg total) by mouth daily. 30 tablet 6  . Multiple Vitamin (MULTIVITAMIN WITH MINERALS) TABS tablet Take 1 tablet by mouth daily.    . Omega-3 Fatty Acids (FISH OIL) 1000 MG CAPS Take 1 capsule by mouth daily.     Marland Kitchen. RANEXA 500 MG 12 hr tablet TAKE 1 TABLET BY MOUTH TWICE DAILY 60 tablet 3  . tadalafil (CIALIS) 10 MG tablet Take 1 tablet (10 mg total) by mouth daily as needed for erectile dysfunction (Take as needed before sexual activity.). 10 tablet 6   No current facility-administered medications for this visit.     Allergies  Allergen Reactions  . Shellfish Allergy Itching    Review of Systems negative except from HPI and PMH  Physical Exam BP 140/80 (BP Location: Left Arm, Patient Position: Sitting, Cuff Size: Large)   Pulse (!) 53   Ht 6' (1.829 m)   Wt 286 lb 4 oz (129.8 kg)   BMI 38.82 kg/m  Well developed and nourished in no acute distress HENT normal Neck supple with JVP  Clear RRR  +S4 Abd-soft with active BS No Clubbing cyanosis 1+ edema Skin-warm and dry A & Oriented  Grossly normal sensory and motor function  ECG today demonstrated sinus rhythm at 53  Intervals 18/11/46     Assessment and  Plan Atrial fibrillation paroxysmal RVR  Hypertension  Hyperlipidemia  Obesity Blood pressure is well-controlled at 120 range.   We discussed alternative strategies i.e. changing antiarrhythmic drugs and/or re-visiting  catheter ablation.   At this point, he would like to continue our current approach.  I've encouraged him and weight loss. This is been issue. He started having clicking in his hip. I wonder whether his IT band. I  showed him some stretches and told to follow-up. If this visit date

## 2016-06-21 NOTE — Addendum Note (Signed)
Addended by: Festus AloeRESPO, Geniya Fulgham G on: 06/21/2016 10:29 AM   Modules accepted: Orders

## 2016-06-22 ENCOUNTER — Other Ambulatory Visit: Payer: Self-pay | Admitting: Family Medicine

## 2016-06-22 DIAGNOSIS — Z20828 Contact with and (suspected) exposure to other viral communicable diseases: Secondary | ICD-10-CM

## 2016-06-22 MED ORDER — OSELTAMIVIR PHOSPHATE 75 MG PO CAPS: 75.0000 mg | ORAL_CAPSULE | Freq: Two times a day (BID) | ORAL | 0 refills | Status: DC

## 2016-06-22 NOTE — Progress Notes (Signed)
Wife, Tonette BihariDonna Calix, called stating that their grandson diagnosed positive Influenza A after known exposure at daycare, test today, and given treatment with Tamiflu, patient was asymptomatic. Other family members given prophylaxis dose Tamiflu. Now Lupita LeashDonna is requesting same prophylaxis tamiflu for her and her husband, Gavin PottersDavid Mclane. They are both asymptomatic at this time, I sent in one rx for Onalee Huaavid for Tamiflu 75mg  BID for 5 days #10, alternatively may take 1 cap BID for 5 days and may share with wife for prophylaxis dose each, advised to notify us if symptoms or seek medical treatment at urgent care if after hours and we are closed and they need additional treatment or confirmatory testing.  Saralyn PilarAlexander Lonzell Dorris, DO [facility] 06/22/2016, 12:08 PM

## 2016-07-07 ENCOUNTER — Telehealth: Payer: Self-pay | Admitting: Internal Medicine

## 2016-07-07 ENCOUNTER — Telehealth: Payer: Self-pay | Admitting: *Deleted

## 2016-07-07 ENCOUNTER — Other Ambulatory Visit: Payer: Self-pay | Admitting: Internal Medicine

## 2016-07-07 MED ORDER — HYDROCHLOROTHIAZIDE 25 MG PO TABS: 25.0000 mg | ORAL_TABLET | Freq: Every day | ORAL | 1 refills | Status: DC

## 2016-07-07 MED ORDER — RANOLAZINE ER 500 MG PO TB12: 500.0000 mg | ORAL_TABLET | Freq: Two times a day (BID) | ORAL | 1 refills | Status: DC

## 2016-07-07 NOTE — Telephone Encounter (Signed)
Sent refills

## 2016-07-07 NOTE — Telephone Encounter (Signed)
Prior Authorization for Ranexa 500mg  started in [email or web address].  Key  BPRDTP ICD 10 code used  I48.91- atrial fibrillation. Started Ranexa on 10/05/14 Started Flecainide 07/13/2014 Started Diltiazem on 09/01/14.   Used information from office note by Dr Graciela HusbandsKlein on 12/22/14 and 01/06/16 stating "He has had no recurrent atrial fibrillation on a combination of flecainide and Ranexa and his QRS duration is stable; we will continue him on his current medications."  Submitted on 07/07/16. Waiting for outcome.

## 2016-07-07 NOTE — Telephone Encounter (Signed)
°*  STAT* If patient is at the pharmacy, call can be transferred to refill team.   1. Which medications need to be refilled? (please list name of each medication and dose if known) Renexa and Hctz He is also asking if we can send all of his heart medications in, doesn't think we called them in last time he was here.  2. Which pharmacy/location (including street and city if local pharmacy) is medication to be sent to?[facility] [place]  3. Do they need a 30 day or 90 day supply? 90 day

## 2016-07-10 ENCOUNTER — Other Ambulatory Visit: Payer: Self-pay

## 2016-07-10 MED ORDER — RANOLAZINE ER 500 MG PO TB12: 500.0000 mg | ORAL_TABLET | Freq: Two times a day (BID) | ORAL | 3 refills | Status: DC

## 2016-07-10 MED ORDER — FLECAINIDE ACETATE 100 MG PO TABS: 100.0000 mg | ORAL_TABLET | Freq: Two times a day (BID) | ORAL | 6 refills | Status: DC

## 2016-07-10 NOTE — Telephone Encounter (Signed)
Prior Authorization request denied.  Appeal letter or form may be submitted to request further prior authorization.  Will discuss with Sherri RadHeather McGhee, RN when in the office again(tomorrow, 07/10/16).

## 2016-07-10 NOTE — Telephone Encounter (Signed)
To Dr. Klein to review. 

## 2016-07-11 NOTE — Telephone Encounter (Signed)
Another PA was sent to the office for the patient's Ranexa- 2nd PA initiated through Cover My meds.  Awaiting determination.

## 2016-07-13 ENCOUNTER — Encounter: Payer: Self-pay | Admitting: Internal Medicine

## 2016-07-13 ENCOUNTER — Telehealth: Payer: Self-pay | Admitting: Internal Medicine

## 2016-07-13 NOTE — Telephone Encounter (Signed)
Coralee RudSabrina F Gilley at 07/13/2016 10:17 AM   Status: Signed    Pt called, states his insurance company is denying a refill for Ranexa, states there is an alternative. Pt is completely out. Please call

## 2016-07-13 NOTE — Telephone Encounter (Signed)
Pt called, states his insurance company is denying a refill for Ranexa, states there is an alternative. Pt is completely out. Please call.

## 2016-07-13 NOTE — Telephone Encounter (Signed)
Routing to Dr Odessa FlemingKlein's nurse, Sherri RadHeather McGhee, RN.

## 2016-07-13 NOTE — Telephone Encounter (Signed)
I called and spoke with the patient. I advised him I received notification of a 2nd denial today from his insurance regarding Ranexa. I made him aware that I will speak with Dr. Graciela HusbandsKlein to see if we can do an appeal. I will notify him of where we are in this process.  He also states that his previous carrier was covering this somewhat, but he is still paying a good amount on this drug. I advised him to check with his pharmacy to see what his out of pocket cost would be to see if there is much difference from what he has been paying. He is agreeable.

## 2016-07-13 NOTE — Telephone Encounter (Signed)
Previous encounter open for the same issue.  Will close this encounter. 

## 2016-07-14 NOTE — Telephone Encounter (Signed)
Please call patient regarding letter that was sent to his insurance company regarding Ranexa.

## 2016-07-14 NOTE — Telephone Encounter (Signed)
Pt states appeal letter sent yesterday had old [employer] member ID.  Effective July 03, 2016, member ID: [id number]. Forward to Sherri RadHeather McGhee, RN  Pt is out of Ranexa. Samples of this drug were given to the patient, quantity 4 boxes, Lot Number [id number] ; exp 03/2019 At front desk for pick up.

## 2016-07-18 ENCOUNTER — Encounter: Payer: Self-pay | Admitting: *Deleted

## 2016-07-18 NOTE — Telephone Encounter (Signed)
[facility] with updated member ID #. Fax # [phone]/ Confirmation received.

## 2016-08-03 ENCOUNTER — Telehealth: Payer: Self-pay | Admitting: Internal Medicine

## 2016-08-03 NOTE — Telephone Encounter (Signed)
Patient calling the office for samples of medication:   1.  What medication and dosage are you requesting samples for? Ranexa 500 mg po BOD   2.  Are you currently out of this medication? Only a couple pills left patient is going out of town Tuesday needs to pick up prior to leaving

## 2016-08-04 NOTE — Telephone Encounter (Signed)
Ranexa samples placed at front desk for pick up. 

## 2016-08-31 ENCOUNTER — Telehealth: Payer: Self-pay | Admitting: Internal Medicine

## 2016-08-31 NOTE — Telephone Encounter (Signed)
Pt would like Ranexa samples.  

## 2016-08-31 NOTE — Telephone Encounter (Signed)
Placed samples of Ranexa 500mg  (4 packs) at the front for pick-up. ZOX:[id number] EXP: 03/2019 Pt notified.

## 2016-10-04 ENCOUNTER — Other Ambulatory Visit: Payer: Self-pay | Admitting: Internal Medicine

## 2016-11-08 DIAGNOSIS — C44719 Basal cell carcinoma of skin of left lower limb, including hip: Secondary | ICD-10-CM | POA: Diagnosis not present

## 2016-11-08 DIAGNOSIS — D485 Neoplasm of uncertain behavior of skin: Secondary | ICD-10-CM | POA: Diagnosis not present

## 2016-11-08 DIAGNOSIS — Z808 Family history of malignant neoplasm of other organs or systems: Secondary | ICD-10-CM | POA: Diagnosis not present

## 2016-11-08 DIAGNOSIS — L821 Other seborrheic keratosis: Secondary | ICD-10-CM | POA: Diagnosis not present

## 2016-11-29 DIAGNOSIS — C44719 Basal cell carcinoma of skin of left lower limb, including hip: Secondary | ICD-10-CM | POA: Diagnosis not present

## 2017-01-01 ENCOUNTER — Other Ambulatory Visit: Payer: Self-pay | Admitting: Internal Medicine

## 2017-01-08 ENCOUNTER — Other Ambulatory Visit: Payer: Self-pay | Admitting: *Deleted

## 2017-01-08 MED ORDER — HYDROCHLOROTHIAZIDE 25 MG PO TABS: 25.0000 mg | ORAL_TABLET | Freq: Every day | ORAL | 0 refills | Status: DC

## 2017-01-09 ENCOUNTER — Other Ambulatory Visit: Payer: Self-pay | Admitting: *Deleted

## 2017-01-09 MED ORDER — HYDROCHLOROTHIAZIDE 25 MG PO TABS: 25.0000 mg | ORAL_TABLET | Freq: Every day | ORAL | 0 refills | Status: DC

## 2017-01-11 ENCOUNTER — Other Ambulatory Visit: Payer: Self-pay | Admitting: *Deleted

## 2017-01-11 MED ORDER — HYDROCHLOROTHIAZIDE 25 MG PO TABS: 25.0000 mg | ORAL_TABLET | Freq: Every day | ORAL | 0 refills | Status: DC

## 2017-01-15 ENCOUNTER — Other Ambulatory Visit: Payer: Self-pay

## 2017-01-15 MED ORDER — HYDROCHLOROTHIAZIDE 25 MG PO TABS: 25.0000 mg | ORAL_TABLET | Freq: Every day | ORAL | 3 refills | Status: DC

## 2017-01-25 ENCOUNTER — Ambulatory Visit (INDEPENDENT_AMBULATORY_CARE_PROVIDER_SITE_OTHER): Payer: BLUE CROSS/BLUE SHIELD | Admitting: Internal Medicine

## 2017-01-25 ENCOUNTER — Encounter: Payer: Self-pay | Admitting: Internal Medicine

## 2017-01-25 VITALS — BP 124/80 | HR 54 | Ht 72.0 in | Wt 292.0 lb

## 2017-01-25 DIAGNOSIS — I480 Paroxysmal atrial fibrillation: Secondary | ICD-10-CM | POA: Diagnosis not present

## 2017-01-25 DIAGNOSIS — E782 Mixed hyperlipidemia: Secondary | ICD-10-CM

## 2017-01-25 DIAGNOSIS — I10 Essential (primary) hypertension: Secondary | ICD-10-CM

## 2017-01-25 MED ORDER — RANOLAZINE ER 500 MG PO TB12: 500.0000 mg | ORAL_TABLET | Freq: Two times a day (BID) | ORAL | 11 refills | Status: DC

## 2017-01-25 MED ORDER — BENAZEPRIL HCL 20 MG PO TABS: 20.0000 mg | ORAL_TABLET | Freq: Two times a day (BID) | ORAL | 3 refills | Status: DC

## 2017-01-25 MED ORDER — FLECAINIDE ACETATE 100 MG PO TABS: 100.0000 mg | ORAL_TABLET | Freq: Two times a day (BID) | ORAL | 3 refills | Status: DC

## 2017-01-25 MED ORDER — HYDROCHLOROTHIAZIDE 25 MG PO TABS: 25.0000 mg | ORAL_TABLET | Freq: Every day | ORAL | 3 refills | Status: DC

## 2017-01-25 MED ORDER — DILTIAZEM HCL ER COATED BEADS 120 MG PO CP24: 120.0000 mg | ORAL_CAPSULE | Freq: Every day | ORAL | 3 refills | Status: DC

## 2017-01-25 NOTE — Progress Notes (Signed)
1      Patient Care Team: Smitty CordsKaramalegos, Alexander J, DO as PCP - General (Family Medicine)   HPI  Jose Garcia is a 56 y.o. male Is seen in followup forparoxysmal atrial fibrillation associated with a transient cardiomyopathy. He has a history of treated hypertension  repeat cardioversion 11/15. He was started on flecainide and underwent repeat cardioversion again 1/16  with early reversion to atrial fibrillation   was also noted to have post cardioversion bradycardia. His metoprolol was decreased and his flecainide was increased. Cardioversion was not undertaken.  He has reverted to sinus rhythm.  Date                QRSd dose  6/15 94 o  2/16 104 100   3/16 116 150 bid decreased 2/2 QRSd   6/16 116 100  7/16 110 100  12/17` 108 100  7/18 92 100        No interval Af of which he is aware.  No cp sob or edema   Last ejection fraction TEE 2/15 50-55%  echocardiogram 3/16 demonstrated a left atrial dimension of 47/1.7  Myoview scan 1/16 demonstrated no perfusion abnormalities and normal LV function   Thromboembolic risk factors (, HTN-1) for a CHADSVASc Score of 1  No anticoagulation   Date K  12/17 4.1         Past Medical History:  Diagnosis Date  . Atrial fibrillation (HCC)   . Cardiomyopathy    Resolved 05/03/09  . Hyperlipidemia   . Hypertension 03/03/96    Past Surgical History:  Procedure Laterality Date  . CARDIOVERSION      Current Outpatient Prescriptions  Medication Sig Dispense Refill  . benazepril (LOTENSIN) 20 MG tablet TAKE 1 TABLET BY MOUTH TWICE DAILY 60 tablet 0  . CARTIA XT 120 MG 24 hr capsule TAKE 1 CAPSULE BY MOUTH DAILY 30 capsule 0  . flecainide (TAMBOCOR) 100 MG tablet Take 1 tablet (100 mg total) by mouth 2 (two) times daily. 60 tablet 6  . hydrochlorothiazide (HYDRODIURIL) 25 MG tablet Take 1 tablet (25 mg total) by mouth daily. 30 tablet 3  . Multiple Vitamin (MULTIVITAMIN WITH MINERALS) TABS tablet Take 1 tablet by mouth daily.      . Omega-3 Fatty Acids (FISH OIL) 1000 MG CAPS Take 1 capsule by mouth daily.     . ranolazine (RANEXA) 500 MG 12 hr tablet Take 1 tablet (500 mg total) by mouth 2 (two) times daily. 180 tablet 3   No current facility-administered medications for this visit.     Allergies  Allergen Reactions  . Shellfish Allergy Itching    Review of Systems negative except from HPI and PMH  Physical Exam BP 124/80 (BP Location: Left Arm, Patient Position: Sitting, Cuff Size: Large)   Pulse (!) 54   Ht 6' (1.829 m)   Wt 292 lb (132.5 kg)   BMI 39.60 kg/m   Well developed and nourished in no acute distress HENT normal Neck supple with JVP-flat Carotids brisk and full without bruits Clear Regular rate and rhythm, no murmurs or gallops Abd-soft with active BS without hepatomegaly No Clubbing cyanosis edema Skin-warm and dry A & Oriented  Grossly normal sensory and motor function   ECG today demonstrated sinus rhythm at54 16/09/47   Assessment and  Plan Atrial fibrillation paroxysmal-- RVR  Hypertension  Hyperlipidemia  Obesity     LDL remains elevated   But 10 yr calculator risk is 6 %  He would  be candidate for mod dose statin  Will plan to recheck in the fall with further exercise efforts although with 15 lb year weight gain not sanguine  BP ok  Check K on HCTZ  No afib  QRS d amazingly short  Can incrase flec again if needed

## 2017-01-25 NOTE — Patient Instructions (Signed)
Medication Instructions: - Your physician recommends that you continue on your current medications as directed. Please refer to the Current Medication list given to you today.  Labwork: - Your physician recommends that you have lab work today: [employer]  Procedures/Testing: - none ordered  Follow-Up: - Your physician wants you to follow-up in: 6 months with Dr. Graciela HusbandsKlein. You will receive a reminder letter in the mail two months in advance. If you don't receive a letter, please call our office to schedule the follow-up appointment.   Any Additional Special Instructions Will Be Listed Below (If Applicable).     If you need a refill on your cardiac medications before your next appointment, please call your pharmacy.

## 2017-01-27 LAB — BASIC METABOLIC PANEL
BUN / CREAT RATIO: 16 (ref 9–20)
BUN: 15 mg/dL (ref 6–24)
CALCIUM: 9.4 mg/dL (ref 8.7–10.2)
CHLORIDE: 101 mmol/L (ref 96–106)
CO2: 25 mmol/L (ref 20–29)
CREATININE: 0.91 mg/dL (ref 0.76–1.27)
GFR calc non Af Amer: 95 mL/min/{1.73_m2} (ref >59)
GFR, EST AFRICAN AMERICAN: 109 mL/min/{1.73_m2} (ref >59)
Glucose: 105 mg/dL — ABNORMAL HIGH (ref 65–99)
Potassium: 4.3 mmol/L (ref 3.5–5.2)
Sodium: 141 mmol/L (ref 134–144)

## 2017-01-30 ENCOUNTER — Telehealth: Payer: Self-pay

## 2017-01-30 NOTE — Telephone Encounter (Signed)
Pt is aware and agreeable to normal results  

## 2017-04-16 DIAGNOSIS — G4733 Obstructive sleep apnea (adult) (pediatric): Secondary | ICD-10-CM | POA: Diagnosis not present

## 2017-05-20 ENCOUNTER — Other Ambulatory Visit: Payer: Self-pay | Admitting: Internal Medicine

## 2017-09-13 ENCOUNTER — Telehealth: Payer: Self-pay | Admitting: Internal Medicine

## 2017-09-13 ENCOUNTER — Ambulatory Visit (INDEPENDENT_AMBULATORY_CARE_PROVIDER_SITE_OTHER): Payer: BLUE CROSS/BLUE SHIELD | Admitting: Internal Medicine

## 2017-09-13 ENCOUNTER — Encounter: Payer: Self-pay | Admitting: Internal Medicine

## 2017-09-13 VITALS — BP 148/80 | HR 58 | Ht 72.0 in | Wt 294.8 lb

## 2017-09-13 DIAGNOSIS — E782 Mixed hyperlipidemia: Secondary | ICD-10-CM

## 2017-09-13 DIAGNOSIS — I480 Paroxysmal atrial fibrillation: Secondary | ICD-10-CM

## 2017-09-13 DIAGNOSIS — I10 Essential (primary) hypertension: Secondary | ICD-10-CM

## 2017-09-13 NOTE — Patient Instructions (Addendum)
Medication Instructions: - Your physician recommends that you continue on your current medications as directed. Please refer to the Current Medication list given to you today.  Labwork: - none ordered  Procedures/Testing: - Your physician has recommended that you have a CT Calcium Score  ($150 out of pocket)  [address] ([place] office)  Follow-Up: Your physician wants you to follow-up in: 6 months with Dr. Graciela HusbandsKlein. You will receive a reminder letter in the mail two months in advance. If you don't receive a letter, please call our office to schedule the follow-up appointment.   Any Additional Special Instructions Will Be Listed Below (If Applicable).     If you need a refill on your cardiac medications before your next appointment, please call your pharmacy.

## 2017-09-13 NOTE — Telephone Encounter (Signed)
I called the patient and notified him that his CT calcium score is scheduled for Wednesday 09/26/17 at 8:30 am. He is agreeable.

## 2017-09-13 NOTE — Progress Notes (Signed)
1      Patient Care Team: Smitty CordsKaramalegos, Alexander J, DO as PCP - General (Family Medicine)   HPI  [name] is a 57 y.o. male Is seen in followup forparoxysmal atrial fibrillation associated with a transient cardiomyopathy. He has a history of treated hypertension  repeat cardioversion 11/15. He was started on flecainide and underwent repeat cardioversion again 1/16  with early reversion to atrial fibrillation--noted to have post cardioversion bradycardia. His metoprolol was decreased and his flecainide was increased. Cardioversion was not undertaken.  He has maintained sinus rhythm  Date                QRSd dose  6/15 94 o  2/16 104 100   3/16 116 150 bid decreased 2/2 QRSd   6/16 116 100  7/16 110 100  12/17` 108 100  7/18 92 100  3/19 114 100    Last ejection fraction TEE 2/15 50-55%;  echocardiogram 3/16 demonstrated a left atrial dimension of 47/1.7  Myoview scan 1/16 demonstrated no perfusion abnormalities and normal LV function   Thromboembolic risk factors (, HTN-1) for a CHADSVASc Score of 1 >>  No anticoagulation   Date K LDL  12/17 4.1 124  7/18  4.3    The patient denies chest pain, shortness of breath, nocturnal dyspnea, orthopnea or peripheral edema.  There have been no palpitations, lightheadedness or syncope.   Not exercising  Sleep apnea well controlled    Past Medical History:  Diagnosis Date  . Atrial fibrillation (HCC)   . Cardiomyopathy    Resolved 05/03/09  . Hyperlipidemia   . Hypertension 03/03/96    Past Surgical History:  Procedure Laterality Date  . CARDIOVERSION      Current Outpatient Medications  Medication Sig Dispense Refill  . benazepril (LOTENSIN) 20 MG tablet Take 1 tablet (20 mg total) by mouth 2 (two) times daily. 180 tablet 3  . diltiazem (CARTIA XT) 120 MG 24 hr capsule Take 1 capsule (120 mg total) by mouth daily. 90 capsule 3  . flecainide (TAMBOCOR) 100 MG tablet Take 1 tablet (100 mg total) by mouth 2 (two)  times daily. 180 tablet 3  . hydrochlorothiazide (HYDRODIURIL) 25 MG tablet TAKE 1 TABLET BY MOUTH DAILY 30 tablet 8  . Multiple Vitamin (MULTIVITAMIN WITH MINERALS) TABS tablet Take 1 tablet by mouth daily.    . Omega-3 Fatty Acids (FISH OIL) 1000 MG CAPS Take 1 capsule by mouth daily.     . ranolazine (RANEXA) 500 MG 12 hr tablet Take 1 tablet (500 mg total) by mouth 2 (two) times daily. 60 tablet 11   No current facility-administered medications for this visit.     Allergies  Allergen Reactions  . Shellfish Allergy Itching    Review of Systems negative except from HPI and PMH  Physical Exam BP (!) 148/80 (BP Location: Left Arm, Patient Position: Sitting, Cuff Size: Large)   Pulse (!) 58   Ht 6' (1.829 m)   Wt 294 lb 12 oz (133.7 kg)   BMI 39.98 kg/m   Well developed and nourished in no acute distress HENT normal Neck supple with JVP-flat Clear Regular rate and rhythm, no murmurs or gallops Abd-soft with active BS No Clubbing cyanosis edema Skin-warm and dry A & Oriented  Grossly normal sensory and motor function    ECG today sinus at 58 Intervals 18/11/45  Assessment and  Plan Atrial fibrillation paroxysmal-- RVR  Hypertension  Hyperlipidemia all  Abnormal ECG all  Obesity  He has been unable to lose weight   Encouraged exercise  His last risk 6%--18 mon ago and so we will proceed with calcium scoring.  If it 0 nothing else needs to be done.  If greater than 0, and certainly if greater than 100, would consider and recommend statin therapy respectively.  If between 1 and 100, would repeat his lipids.  Blood pressure remains elevated.  Stressed again the value and the importance of exercise.  Sleep apnea treated and being followed remotely  ECG QRS d  Back about 20%  Will keep our eye on this   More than 50% of 40 min was spent in counseling related to the above

## 2017-09-26 ENCOUNTER — Ambulatory Visit
Admission: RE | Admit: 2017-09-26 | Discharge: 2017-09-26 | Disposition: A | Discharge status: Home / Self Care | Payer: Self-pay | Source: Ambulatory Visit | Attending: Internal Medicine | Admitting: Internal Medicine

## 2017-09-26 DIAGNOSIS — E782 Mixed hyperlipidemia: Secondary | ICD-10-CM

## 2018-01-24 ENCOUNTER — Other Ambulatory Visit: Payer: Self-pay | Admitting: Internal Medicine

## 2018-01-30 ENCOUNTER — Other Ambulatory Visit: Payer: Self-pay | Admitting: Internal Medicine

## 2018-02-11 ENCOUNTER — Other Ambulatory Visit: Payer: Self-pay | Admitting: Internal Medicine

## 2018-03-28 ENCOUNTER — Ambulatory Visit (INDEPENDENT_AMBULATORY_CARE_PROVIDER_SITE_OTHER): Payer: BLUE CROSS/BLUE SHIELD | Admitting: Internal Medicine

## 2018-03-28 ENCOUNTER — Encounter: Payer: Self-pay | Admitting: Internal Medicine

## 2018-03-28 VITALS — BP 138/70 | HR 60 | Ht 72.0 in | Wt 281.2 lb

## 2018-03-28 DIAGNOSIS — I10 Essential (primary) hypertension: Secondary | ICD-10-CM | POA: Diagnosis not present

## 2018-03-28 DIAGNOSIS — I480 Paroxysmal atrial fibrillation: Secondary | ICD-10-CM | POA: Diagnosis not present

## 2018-03-28 MED ORDER — BENAZEPRIL HCL 20 MG PO TABS: 20.0000 mg | ORAL_TABLET | Freq: Two times a day (BID) | ORAL | 3 refills | Status: DC

## 2018-03-28 MED ORDER — RANOLAZINE ER 500 MG PO TB12: 500.0000 mg | ORAL_TABLET | Freq: Two times a day (BID) | ORAL | 3 refills | Status: DC

## 2018-03-28 MED ORDER — DILTIAZEM HCL ER COATED BEADS 120 MG PO CP24: 120.0000 mg | ORAL_CAPSULE | Freq: Every day | ORAL | 3 refills | Status: DC

## 2018-03-28 MED ORDER — FLECAINIDE ACETATE 100 MG PO TABS: ORAL_TABLET | ORAL | 3 refills | Status: DC

## 2018-03-28 MED ORDER — HYDROCHLOROTHIAZIDE 25 MG PO TABS: 25.0000 mg | ORAL_TABLET | Freq: Every day | ORAL | 3 refills | Status: DC

## 2018-03-28 NOTE — Patient Instructions (Addendum)
Medication Instructions: - Your physician recommends that you continue on your current medications as directed. Please refer to the Current Medication list given to you today.  Labwork: - Your physician recommends that you have lab work today: BMP  Procedures/Testing: - none ordered  Follow-Up: - Your physician wants you to follow-up in: 6 months with Dr. Klein. You will receive a reminder letter in the mail two months in advance. If you don't receive a letter, please call our office to schedule the follow-up appointment.   Any Additional Special Instructions Will Be Listed Below (If Applicable).     If you need a refill on your cardiac medications before your next appointment, please call your pharmacy.   

## 2018-03-28 NOTE — Progress Notes (Signed)
1      Patient Care Team: Smitty Cords, DO as PCP - General (Family Medicine)   HPI  Jose Garcia is a 57 y.o. male Is seen in followup forparoxysmal atrial fibrillation associated with a transient cardiomyopathy. He has a history of treated hypertension  repeat cardioversion 11/15. He was started on flecainide and underwent repeat cardioversion again 1/16  with early reversion to atrial fibrillation--noted to have post cardioversion bradycardia. His metoprolol was decreased and his flecainide was increased. Cardioversion was not undertaken.  He has maintained sinus rhythm  Date                PR  QRSd dose  6/15  94 o  2/16  104 100   3/16  116 150 bid decreased 2/2 QRSd   6/16  116 100  7/16  110 100  12/17`  108 100  7/18  92 100  3/19 180 114 100  9/19 164 102 100         Last ejection fraction TEE 2/15 50-55%;  echocardiogram 3/16 demonstrated a left atrial dimension of 47/1.7 DATE TEST EF   2/15 TEE  50-55 %   1/16 Myoview  No perfusion defects  3//16 Echo   55-60 % `  3/19 CaScore  0      Lipid status begged risk stratification >>As above and no lipids indicated       Thromboembolic risk factors ( HTN-1) for a CHADSVASc Score of 1 >>  No anticoagulation   Date K LDL  12/17 4.1 124  7/18  4.3     The patient denies chest pain, shortness of breath, nocturnal dyspnea, orthopnea or peripheral edema.  There have been no palpitations, lightheadedness or syncope.    Past Medical History:  Diagnosis Date  . Atrial fibrillation (HCC)   . Cardiomyopathy    Resolved 05/03/09  . Hyperlipidemia   . Hypertension 03/03/96    Past Surgical History:  Procedure Laterality Date  . CARDIOVERSION      Current Outpatient Medications  Medication Sig Dispense Refill  . benazepril (LOTENSIN) 20 MG tablet TAKE 1 TABLET BY MOUTH TWICE DAILY 180 tablet 2  . diltiazem (CARDIZEM CD) 120 MG 24 hr capsule TAKE ONE CAPSULE BY MOUTH EVERY DAY 90 capsule 2  .  flecainide (TAMBOCOR) 100 MG tablet TAKE 1 TABLET BY MOUTH TWICE DAILY(AUROBINDO ONLY) 180 tablet 3  . hydrochlorothiazide (HYDRODIURIL) 25 MG tablet TAKE 1 TABLET BY MOUTH DAILY 30 tablet 8  . Multiple Vitamin (MULTIVITAMIN WITH MINERALS) TABS tablet Take 1 tablet by mouth daily.    . Omega-3 Fatty Acids (FISH OIL) 1000 MG CAPS Take 1 capsule by mouth daily.     . ranolazine (RANEXA) 500 MG 12 hr tablet TAKE 1 TABLET BY MOUTH TWICE DAILY 60 tablet 8   No current facility-administered medications for this visit.     Allergies  Allergen Reactions  . Shellfish Allergy Itching    Review of Systems negative except from HPI and PMH  Physical Exam BP 138/70 (BP Location: Left Arm, Patient Position: Sitting, Cuff Size: Large)   Pulse 60   Ht 6' (1.829 m)   Wt 281 lb 4 oz (127.6 kg)   BMI 38.14 kg/m   Well developed and nourished in no acute distress HENT normal Neck supple with JVP-flat Clear Regular rate and rhythm, no murmurs or gallops Abd-soft with active BS No Clubbing cyanosis edema Skin-warm and dry A & Oriented  Grossly normal sensory  and motor function    ECG  Sinus @ 60 16/1046  Assessment and  Plan Atrial fibrillation paroxysmal-- RVR  Hypertension  Hyperlipidemia >> Ca score 0 3/19  Abnormal ECG  QRS widening on flecainide   Obesity   Has lost 20 lbs--encouraged ongoing weight loss efforts   BP well controlled; will check K on diuretic   On Ranolazine adjuntive to flecainide for AFib without recurrences  Continue current coruse

## 2018-03-29 LAB — BASIC METABOLIC PANEL
BUN/Creatinine Ratio: 12 (ref 9–20)
BUN: 11 mg/dL (ref 6–24)
CALCIUM: 9.3 mg/dL (ref 8.7–10.2)
CO2: 21 mmol/L (ref 20–29)
CREATININE: 0.89 mg/dL (ref 0.76–1.27)
Chloride: 102 mmol/L (ref 96–106)
GFR, EST AFRICAN AMERICAN: 110 mL/min/{1.73_m2} (ref >59)
GFR, EST NON AFRICAN AMERICAN: 96 mL/min/{1.73_m2} (ref >59)
Glucose: 76 mg/dL (ref 65–99)
Potassium: 3.7 mmol/L (ref 3.5–5.2)
Sodium: 143 mmol/L (ref 134–144)

## 2018-04-05 DIAGNOSIS — G4733 Obstructive sleep apnea (adult) (pediatric): Secondary | ICD-10-CM | POA: Diagnosis not present

## 2018-05-14 ENCOUNTER — Telehealth: Payer: Self-pay | Admitting: Internal Medicine

## 2018-05-14 NOTE — Telephone Encounter (Signed)
Pt would like a response from his last mychart message regarding generic Ranexa. Please call to discuss.

## 2018-05-16 NOTE — Telephone Encounter (Signed)
I called and spoke with the patient. I advised him that his MyChart message did not get responded to as this was routed to the wrong Margaretmary DysHeather McGee, NT [facility] and not me.  I have advised that I will try to be in touch with [employer] tomorrow for his PA for branded Ranexa.

## 2018-05-24 MED ORDER — RANEXA 500 MG PO TB12: 500.0000 mg | ORAL_TABLET | Freq: Two times a day (BID) | ORAL | 3 refills | Status: DC

## 2018-05-24 NOTE — Telephone Encounter (Signed)
Pt is calling to discuss his PA regarding Ranexa

## 2018-05-24 NOTE — Telephone Encounter (Signed)
I called the patient's insurance company [employer] at [phone]. I [facility] [place] [phone] and advised we needed to try to do a prior authorization for brand name Ranexa. Per ChileJacobia, the patient's insurance does not require a PA for brand Ranexa- he has had this filled brand in the past and it went through without a PA. However, he had generic ranolazine filled last and this cannot be refilled again until 06/05/18.  I clarified with Evern BioJacobia, if I sent in a new RX to the patient's pharmacy for Brand Ranexa 500 mg BID- DAW, that this would not require a PA with his next refill. Per Evern BioJacobia- Brand Ranexa would not require a PA for his next RX.  I have re-submitted a new RX to the pharmacy and called and left a message for them that I spoke with the patient's insurance and to please fill branded Ranexa with his next refill- this should not require a PA.  RX was submitted for DAW.   MyChart message sent to the patient to update him of this.

## 2018-08-06 DIAGNOSIS — M722 Plantar fascial fibromatosis: Secondary | ICD-10-CM | POA: Diagnosis not present

## 2018-08-06 DIAGNOSIS — G5761 Lesion of plantar nerve, right lower limb: Secondary | ICD-10-CM | POA: Diagnosis not present

## 2018-08-27 DIAGNOSIS — G5761 Lesion of plantar nerve, right lower limb: Secondary | ICD-10-CM | POA: Diagnosis not present

## 2018-08-27 DIAGNOSIS — M722 Plantar fascial fibromatosis: Secondary | ICD-10-CM | POA: Diagnosis not present

## 2018-10-22 ENCOUNTER — Other Ambulatory Visit: Payer: Self-pay | Admitting: Internal Medicine

## 2018-10-22 NOTE — Telephone Encounter (Signed)
Patient overdue for F/U. Please call to schedule. Thank you! 

## 2018-10-30 ENCOUNTER — Other Ambulatory Visit: Payer: Self-pay | Admitting: *Deleted

## 2018-10-30 MED ORDER — HYDROCHLOROTHIAZIDE 25 MG PO TABS: 25.0000 mg | ORAL_TABLET | Freq: Every day | ORAL | 4 refills | Status: DC

## 2018-10-30 MED ORDER — BENAZEPRIL HCL 20 MG PO TABS: 20.0000 mg | ORAL_TABLET | Freq: Two times a day (BID) | ORAL | 4 refills | Status: DC

## 2018-10-30 MED ORDER — DILTIAZEM HCL ER COATED BEADS 120 MG PO CP24: 120.0000 mg | ORAL_CAPSULE | Freq: Every day | ORAL | 4 refills | Status: DC

## 2018-10-30 MED ORDER — RANEXA 500 MG PO TB12: 500.0000 mg | ORAL_TABLET | Freq: Two times a day (BID) | ORAL | 4 refills | Status: DC

## 2018-10-30 MED ORDER — FLECAINIDE ACETATE 100 MG PO TABS: ORAL_TABLET | ORAL | 4 refills | Status: DC

## 2018-11-18 ENCOUNTER — Telehealth: Payer: Self-pay

## 2018-11-18 NOTE — Telephone Encounter (Signed)
Fax received from [employer].  Fax stated there is a cost saving for this patient if he is switched to the Generic Alternative of Ranexa.  They are requesting a new prescription for generic medication.

## 2018-11-19 NOTE — Telephone Encounter (Signed)
Hey [place]! It is the patient's preference to be on Brand Name Ranexa.   See 04/22/18 & 05/24/18 patient adivse request.   Thanks!

## 2018-12-12 IMAGING — CT CT HEART SCORING
2 series · 16 of 20 positions shown, 18 images · non-contrast
Comparison: Heart is upper limits normal in size.  None

EXAM:
OVER-READ INTERPRETATION  CT CHEST

The following report is an over-read performed by radiologist Dr.
Blain Jumper [REDACTED] on 09/26/2017. This over-read
does not include interpretation of cardiac or coronary anatomy or
pathology. The coronary calcium score interpretation by the
cardiologist is attached.
CLINICAL DATA: Risk stratification
Coronary Calcium Score
TECHNIQUE: The patient was scanned on a Siemens Somatom 64 slice scanner. Axial
non-contrast 3 mm slices were carried out through the heart. The
data set was analyzed on a dedicated work station and scored using
the Agatson method.

[Series 2: casc 3.0 i36f 2 bestdiast 68 % · axial · 0.47mm/px · z∈[-283,-178]mm · 8 of 46 slices shown, 10 images]
[im 6/46  vessel]
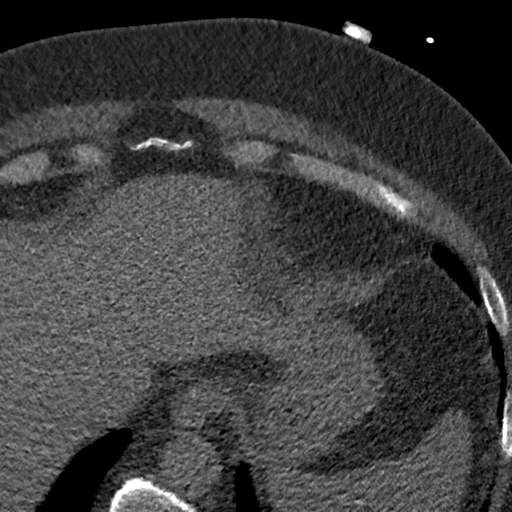
[im 6/46  lung]
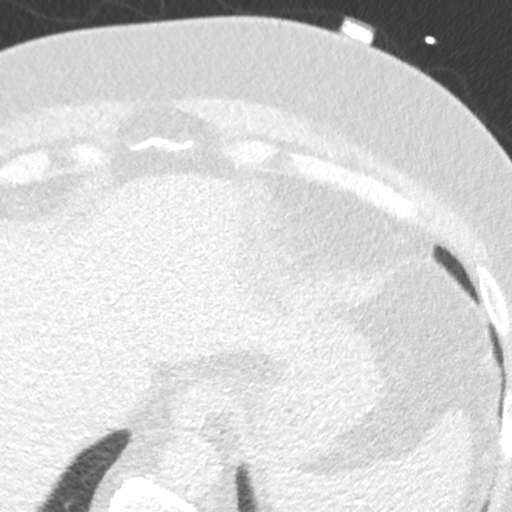
[im 11/46  vessel]
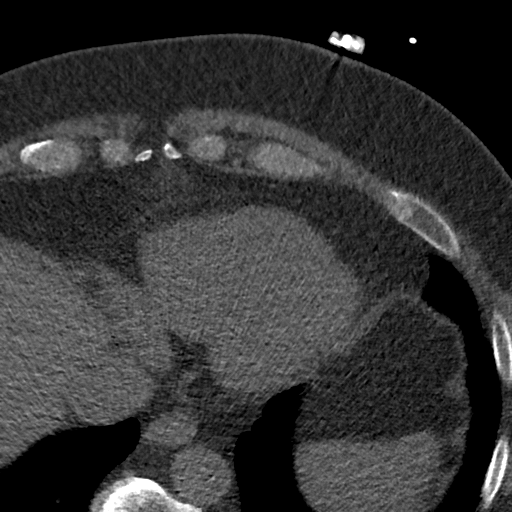
[im 16/46  vessel]
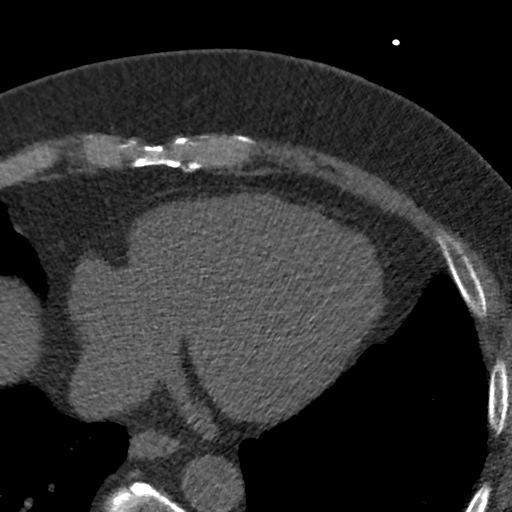
[im 21/46  vessel]
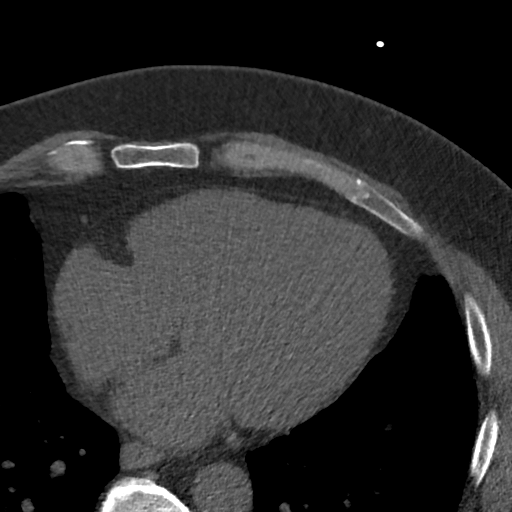
[im 26/46  vessel]
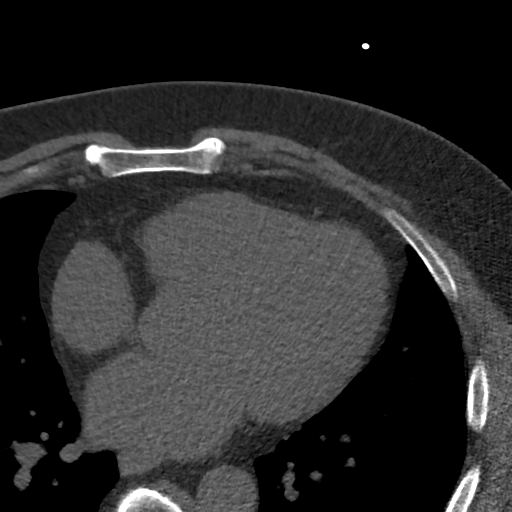
[im 26/46  lung]
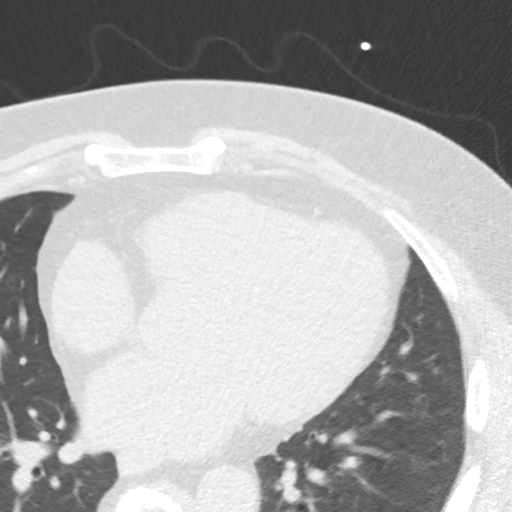
[im 31/46  vessel]
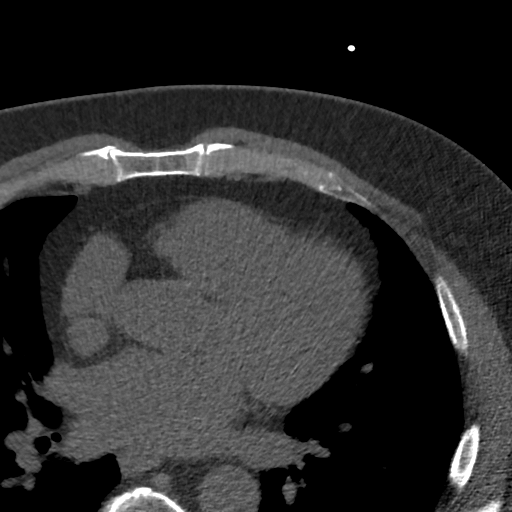
[im 36/46  vessel]
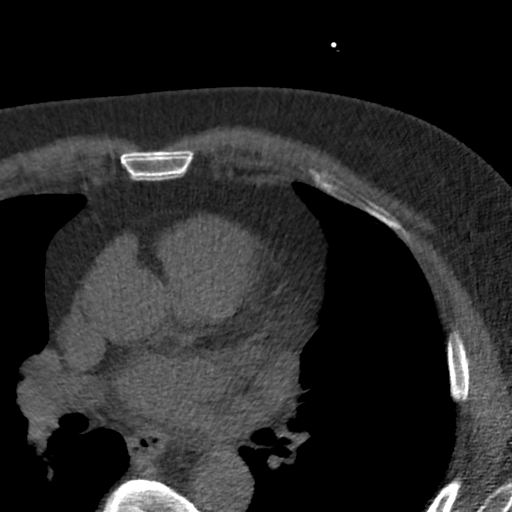
[im 41/46  vessel]
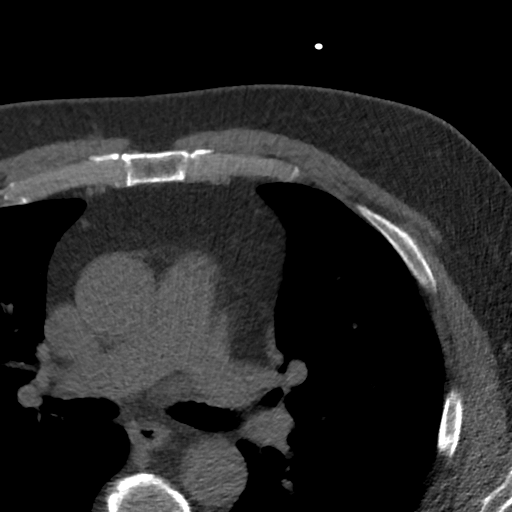

[Series 4: lung st 68 % · axial · 0.73mm/px · z∈[-282,-178]mm · 8 of 46 slices shown]
[im 6/46  lung]
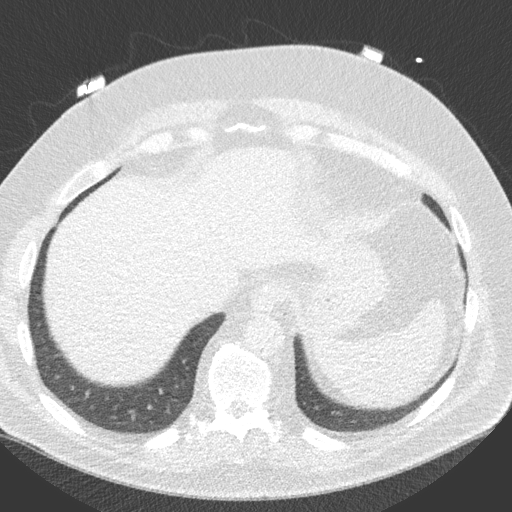
[im 11/46  lung]
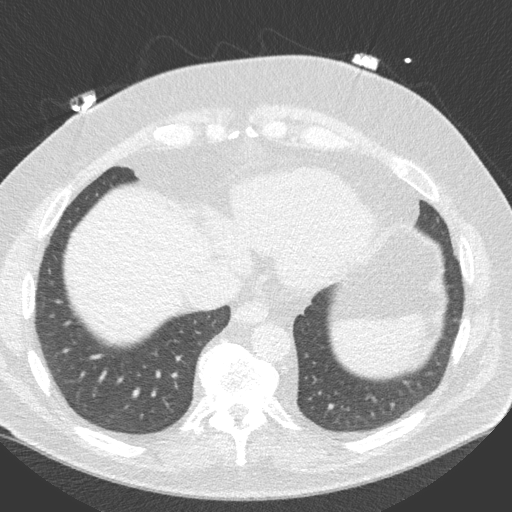
[im 16/46  lung]
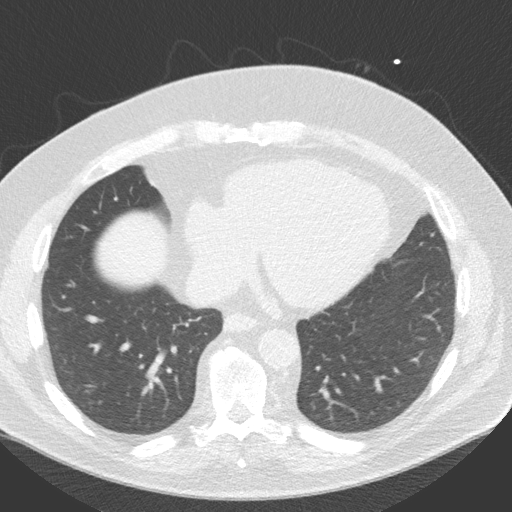
[im 21/46  lung]
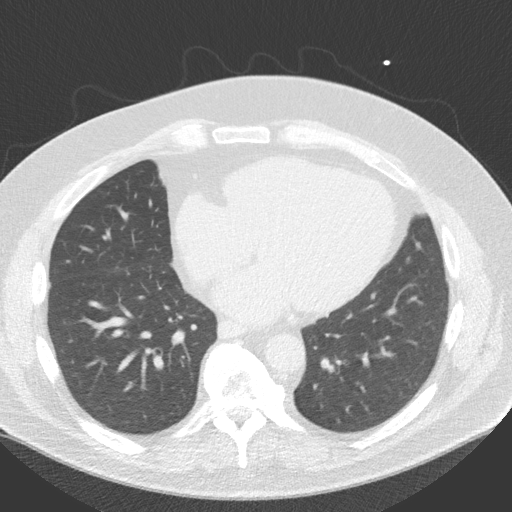
[im 26/46  lung]
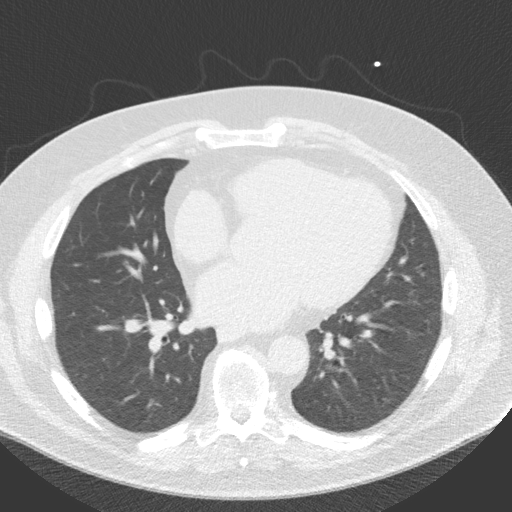
[im 31/46  lung]
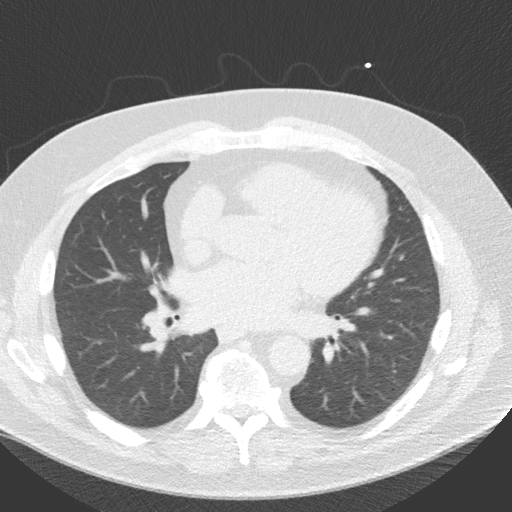
[im 36/46  lung]
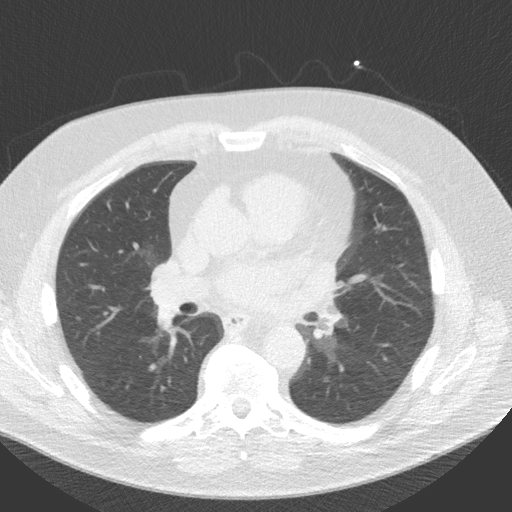
[im 41/46  lung]
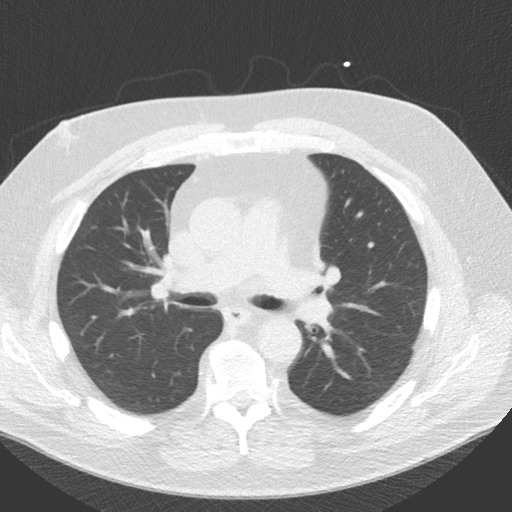

[16 of 20 positions shown; findings below may reference images not displayed]

FINDINGS: Heart is upper limits normal in size. Visualized aorta is normal
caliber. No adenopathy in the lower mediastinum or hila. Visualized
lungs clear. No effusions.

Imaging into the upper abdomen shows no acute findings. Chest wall
soft tissues are unremarkable. No acute bony abnormality. Mild
degenerative spurring in the visualized midthoracic spine.
IMPRESSION: No acute or significant extracardiac abnormality.
FINDINGS: Non-cardiac: See separate report from [REDACTED].

Ascending Aorta: Mildly dilated 3.9 cm

Pericardium: Normal

Coronary arteries: No calcium detected
IMPRESSION: Coronary calcium score of 0.

Jesus German Lecter

## 2019-03-26 ENCOUNTER — Telehealth: Payer: Self-pay

## 2019-03-26 MED ORDER — RANEXA 500 MG PO TB12: 500.0000 mg | ORAL_TABLET | Freq: Two times a day (BID) | ORAL | 0 refills | Status: DC

## 2019-03-26 MED ORDER — HYDROCHLOROTHIAZIDE 25 MG PO TABS: 25.0000 mg | ORAL_TABLET | Freq: Every day | ORAL | 0 refills | Status: DC

## 2019-03-26 MED ORDER — DILTIAZEM HCL ER COATED BEADS 120 MG PO CP24: 120.0000 mg | ORAL_CAPSULE | Freq: Every day | ORAL | 0 refills | Status: DC

## 2019-03-26 NOTE — Addendum Note (Signed)
Addended by: Raelene Bott, BRANDY L on: 03/26/2019 08:06 AM   Modules accepted: Orders

## 2019-03-26 NOTE — Telephone Encounter (Signed)
Requested Prescriptions   Signed Prescriptions Disp Refills  . hydrochlorothiazide (HYDRODIURIL) 25 MG tablet 30 tablet 0    Sig: Take 1 tablet (25 mg total) by mouth daily.    Authorizing Provider: Deboraha Sprang    Ordering User: Raelene Bott, BRANDY L  . diltiazem (CARDIZEM CD) 120 MG 24 hr capsule 30 capsule 0    Sig: Take 1 capsule (120 mg total) by mouth daily.    Authorizing Provider: Deboraha Sprang    Ordering User: NEWCOMER MCCLAIN, BRANDY L  . RANEXA 500 MG 12 hr tablet 60 tablet 0    Sig: Take 1 tablet (500 mg total) by mouth 2 (two) times daily.    Authorizing Provider: Deboraha Sprang    Ordering User: Raelene Bott, BRANDY L

## 2019-03-26 NOTE — Telephone Encounter (Signed)
Requested Prescriptions   Signed Prescriptions Disp Refills  . hydrochlorothiazide (HYDRODIURIL) 25 MG tablet 30 tablet 0    Sig: Take 1 tablet (25 mg total) by mouth daily.    Authorizing Provider: Deboraha Sprang    Ordering User: Raelene Bott, Yianni Skilling L

## 2019-04-01 ENCOUNTER — Other Ambulatory Visit: Payer: Self-pay | Admitting: Internal Medicine

## 2019-04-10 ENCOUNTER — Ambulatory Visit: Payer: BLUE CROSS/BLUE SHIELD | Admitting: Internal Medicine

## 2019-04-18 ENCOUNTER — Other Ambulatory Visit: Payer: Self-pay | Admitting: Internal Medicine

## 2019-04-22 ENCOUNTER — Other Ambulatory Visit: Payer: Self-pay | Admitting: *Deleted

## 2019-04-22 MED ORDER — HYDROCHLOROTHIAZIDE 25 MG PO TABS: 25.0000 mg | ORAL_TABLET | Freq: Every day | ORAL | 1 refills | Status: DC

## 2019-04-22 MED ORDER — DILTIAZEM HCL ER COATED BEADS 120 MG PO CP24: 120.0000 mg | ORAL_CAPSULE | Freq: Every day | ORAL | 0 refills | Status: DC

## 2019-04-24 ENCOUNTER — Ambulatory Visit: Payer: BC Managed Care – PPO | Admitting: Internal Medicine

## 2019-04-29 ENCOUNTER — Telehealth: Payer: Self-pay

## 2019-04-29 MED ORDER — FLECAINIDE ACETATE 100 MG PO TABS: ORAL_TABLET | ORAL | 0 refills | Status: DC

## 2019-04-29 NOTE — Telephone Encounter (Signed)
Requested Prescriptions   Signed Prescriptions Disp Refills  . flecainide (TAMBOCOR) 100 MG tablet 60 tablet 0    Sig: TAKE 1 TABLET BY MOUTH TWICE DAILY    Authorizing Provider: Deboraha Sprang    Ordering User: Raelene Bott, Kinzy Weyers L

## 2019-05-01 ENCOUNTER — Other Ambulatory Visit: Payer: Self-pay

## 2019-05-01 ENCOUNTER — Ambulatory Visit (INDEPENDENT_AMBULATORY_CARE_PROVIDER_SITE_OTHER): Payer: BC Managed Care – PPO | Admitting: Internal Medicine

## 2019-05-01 ENCOUNTER — Encounter: Payer: Self-pay | Admitting: Internal Medicine

## 2019-05-01 VITALS — BP 150/80 | HR 79 | Temp 98.2°F | Ht 72.0 in | Wt 271.0 lb

## 2019-05-01 DIAGNOSIS — Z79899 Other long term (current) drug therapy: Secondary | ICD-10-CM | POA: Diagnosis not present

## 2019-05-01 DIAGNOSIS — I480 Paroxysmal atrial fibrillation: Secondary | ICD-10-CM

## 2019-05-01 MED ORDER — RANEXA 500 MG PO TB12: 500.0000 mg | ORAL_TABLET | Freq: Two times a day (BID) | ORAL | 3 refills | Status: DC

## 2019-05-01 MED ORDER — BENAZEPRIL HCL 20 MG PO TABS: 20.0000 mg | ORAL_TABLET | Freq: Two times a day (BID) | ORAL | 3 refills | Status: DC

## 2019-05-01 MED ORDER — HYDROCHLOROTHIAZIDE 25 MG PO TABS: 25.0000 mg | ORAL_TABLET | Freq: Every day | ORAL | 3 refills | Status: DC

## 2019-05-01 MED ORDER — FLECAINIDE ACETATE 100 MG PO TABS: ORAL_TABLET | ORAL | 3 refills | Status: DC

## 2019-05-01 MED ORDER — DILTIAZEM HCL ER COATED BEADS 120 MG PO CP24: 120.0000 mg | ORAL_CAPSULE | Freq: Every day | ORAL | 3 refills | Status: DC

## 2019-05-01 NOTE — Patient Instructions (Signed)
Medication Instructions:  - Your physician recommends that you continue on your current medications as directed. Please refer to the Current Medication list given to you today.  *If you need a refill on your cardiac medications before your next appointment, please call your pharmacy*  Lab Work: - Your physician recommends that you have lab work today: BMP  If you have labs (blood work) drawn today and your tests are completely normal, you will receive your results only by: Marland Kitchen MyChart Message (if you have MyChart) OR . A paper copy in the mail If you have any lab test that is abnormal or we need to change your treatment, we will call you to review the results.  Testing/Procedures: - none ordered  Follow-Up: At [facility], you and your health needs are our priority.  As part of our continuing mission to provide you with exceptional heart care, we have created designated Provider Care Teams.  These Care Teams include your primary Cardiologist (physician) and Advanced Practice Providers (APPs -  Physician Assistants and Nurse Practitioners) who all work together to provide you with the care you need, when you need it.  Your next appointment:   12 months  The format for your next appointment:   In Person  Provider:   Virl Axe, MD  Other Instructions - n/a

## 2019-05-01 NOTE — Progress Notes (Signed)
1      Patient Care Team: Smitty Cords, DO as PCP - General (Family Medicine)   HPI  Jose Garcia is a 58 y.o. male Is seen in followup forparoxysmal atrial fibrillation associated with a transient cardiomyopathy. He has a history of treated hypertension.  repeat cardioversion 11/15. He was started on flecainide and underwent repeat cardioversion again 1/16  with early reversion to atrial fibrillation--noted to have post cardioversion bradycardia. His metoprolol was decreased and his flecainide was increased. No clinical recurrences  He has maintained sinus rhythm  Date                PR  QRSd dose  6/15  94 o  2/16  104 100   3/16  116 150 bid decreased 2/2 QRSd   6/16  116 100  7/16  110 100  12/17`  108 100  7/18  92 100  3/19 180 114 100  9/19 164 102 100  10/20  170 102 100    Last ejection fraction TEE 2/15 50-55%;  echocardiogram 3/16 demonstrated a left atrial dimension of 47/1.7 DATE TEST EF   2/15 TEE  50-55 %   1/16 Myoview  No perfusion defects  3//16 Echo   55-60 % `  3/19 CaScore  0      Lipid status begged risk stratification >>As above and no lipids indicated       Thromboembolic risk factors ( HTN-1) for a CHADSVASc Score of 1 >>  No anticoagulation   Date K LDL  12/17 4.1 124  7/18  4.3   9/19 3.7     The patient denies chest pain, shortness of breath, nocturnal dyspnea, orthopnea or peripheral edema.  There have been no palpitations, lightheadedness or syncope.    Past Medical History:  Diagnosis Date  . Atrial fibrillation (HCC)   . Cardiomyopathy    Resolved 05/03/09  . Hyperlipidemia   . Hypertension 03/03/96    Past Surgical History:  Procedure Laterality Date  . CARDIOVERSION      Current Outpatient Medications  Medication Sig Dispense Refill  . benazepril (LOTENSIN) 20 MG tablet TAKE 1 TABLET BY MOUTH 2 TIMES DAILY 180 tablet 0  . diltiazem (CARDIZEM CD) 120 MG 24 hr capsule Take 1 capsule (120 mg total) by mouth  daily. 30 capsule 0  . flecainide (TAMBOCOR) 100 MG tablet TAKE 1 TABLET BY MOUTH TWICE DAILY 60 tablet 0  . hydrochlorothiazide (HYDRODIURIL) 25 MG tablet Take 1 tablet (25 mg total) by mouth daily. 30 tablet 1  . Multiple Vitamin (MULTIVITAMIN WITH MINERALS) TABS tablet Take 1 tablet by mouth daily.    . Omega-3 Fatty Acids (FISH OIL) 1000 MG CAPS Take 1 capsule by mouth daily.     Marland Kitchen RANEXA 500 MG 12 hr tablet Take 1 tablet (500 mg total) by mouth 2 (two) times daily. 60 tablet 0   No current facility-administered medications for this visit.     Allergies  Allergen Reactions  . Shellfish Allergy Itching    Review of Systems negative except from HPI and PMH  Physical Exam BP (!) 150/80 (BP Location: Left Arm, Patient Position: Sitting, Cuff Size: Normal)   Pulse 79   Temp 98.2 F (36.8 C)   Ht 6' (1.829 m)   Wt 271 lb (122.9 kg)   SpO2 97%   BMI 36.75 kg/m    Well developed and nourished in no acute distress HENT normal Neck supple with JVP-  flat  Clear Regular rate and rhythm, no murmurs or gallops Abd-soft with active BS No Clubbing cyanosis edema Skin-warm and dry A & Oriented  Grossly normal sensory and motor function  ECG sinus @ 79 17/10/42   Assessment and  Plan Atrial fibrillation paroxysmal-- RVR  Hypertension  Hyperlipidemia >> Ca score 0 3/19  Obesity   No interval Afib of which aware  BP at home 120's  Some success with weight loss   Check blood work on ACE/HCT

## 2019-05-02 LAB — BASIC METABOLIC PANEL
BUN/Creatinine Ratio: 14 (ref 9–20)
BUN: 12 mg/dL (ref 6–24)
CO2: 23 mmol/L (ref 20–29)
Calcium: 9.3 mg/dL (ref 8.7–10.2)
Chloride: 105 mmol/L (ref 96–106)
Creatinine, Ser: 0.84 mg/dL (ref 0.76–1.27)
GFR calc Af Amer: 112 mL/min/{1.73_m2} (ref >59)
GFR calc non Af Amer: 97 mL/min/{1.73_m2} (ref >59)
Glucose: 91 mg/dL (ref 65–99)
Potassium: 3.9 mmol/L (ref 3.5–5.2)
Sodium: 143 mmol/L (ref 134–144)

## 2019-06-24 ENCOUNTER — Other Ambulatory Visit: Payer: Self-pay | Admitting: Internal Medicine

## 2019-08-12 ENCOUNTER — Other Ambulatory Visit: Payer: Self-pay | Admitting: *Deleted

## 2019-08-12 MED ORDER — RANOLAZINE ER 500 MG PO TB12: 500.0000 mg | ORAL_TABLET | Freq: Two times a day (BID) | ORAL | 3 refills | Status: DC

## 2019-10-08 ENCOUNTER — Encounter: Payer: Self-pay | Admitting: Family Medicine

## 2019-10-08 ENCOUNTER — Other Ambulatory Visit: Payer: Self-pay

## 2019-10-08 ENCOUNTER — Ambulatory Visit (INDEPENDENT_AMBULATORY_CARE_PROVIDER_SITE_OTHER): Payer: BC Managed Care – PPO | Admitting: Family Medicine

## 2019-10-08 ENCOUNTER — Other Ambulatory Visit: Payer: Self-pay | Admitting: Family Medicine

## 2019-10-08 VITALS — BP 138/74 | HR 56 | Temp 98.2°F | Resp 16 | Ht 72.0 in | Wt 282.6 lb

## 2019-10-08 DIAGNOSIS — I10 Essential (primary) hypertension: Secondary | ICD-10-CM | POA: Diagnosis not present

## 2019-10-08 DIAGNOSIS — E7800 Pure hypercholesterolemia, unspecified: Secondary | ICD-10-CM

## 2019-10-08 DIAGNOSIS — I429 Cardiomyopathy, unspecified: Secondary | ICD-10-CM | POA: Diagnosis not present

## 2019-10-08 DIAGNOSIS — Z1159 Encounter for screening for other viral diseases: Secondary | ICD-10-CM

## 2019-10-08 DIAGNOSIS — G4733 Obstructive sleep apnea (adult) (pediatric): Secondary | ICD-10-CM

## 2019-10-08 DIAGNOSIS — I480 Paroxysmal atrial fibrillation: Secondary | ICD-10-CM

## 2019-10-08 DIAGNOSIS — R7309 Other abnormal glucose: Secondary | ICD-10-CM

## 2019-10-08 DIAGNOSIS — R0982 Postnasal drip: Secondary | ICD-10-CM

## 2019-10-08 DIAGNOSIS — R351 Nocturia: Secondary | ICD-10-CM

## 2019-10-08 DIAGNOSIS — R1312 Dysphagia, oropharyngeal phase: Secondary | ICD-10-CM

## 2019-10-08 DIAGNOSIS — Z9989 Dependence on other enabling machines and devices: Secondary | ICD-10-CM

## 2019-10-08 DIAGNOSIS — Z0000 Encounter for general adult medical examination without abnormal findings: Secondary | ICD-10-CM

## 2019-10-08 MED ORDER — FLUTICASONE PROPIONATE 50 MCG/ACT NA SUSP: 2.0000 | Freq: Every day | NASAL | 3 refills | Status: AC

## 2019-10-08 MED ORDER — IPRATROPIUM BROMIDE 0.06 % NA SOLN: 2.0000 | Freq: Every evening | NASAL | 2 refills | Status: DC | PRN

## 2019-10-08 NOTE — Assessment & Plan Note (Signed)
Mildly elevated initial BP, repeat manual check improved but seems to have better readings at home. - Home BP readings reviewed  History of transient cardiomyopathy / paroxysmal AFib   Plan:  1. Continue current BP regimen - Benazepril 20mg  BID, HCTZ 25mg  daily, Diltiazem 120mg  daily (24hr) 2. Encourage improved lifestyle - low sodium diet, regular exercise 3. Continue monitor BP outside office, bring readings to next visit, if persistently >140/90 or new symptoms notify office sooner  F/u w cardiology, return here 6 weeks labs physical

## 2019-10-08 NOTE — Assessment & Plan Note (Signed)
Resolved History of transient cardiomyopathy with history of afib Followed by [facility] Cardiology Dr Graciela Husbands

## 2019-10-08 NOTE — Addendum Note (Signed)
Addended by: Smitty Cords on: 10/08/2019 01:24 PM   Modules accepted: Orders

## 2019-10-08 NOTE — Patient Instructions (Addendum)
Thank you for coming to the office today.  Ordered generic Flonase SWITCH To MORNING Start nasal steroid Flonase 2 sprays in each nostril daily for 4-6 weeks, may repeat course seasonally or as needed  For bedtime to dry up and reduce drainage. Start Atrovent nasal spray 2 sprays in each nostril nightly before bed, - May repeat dose up to max 4 times a day if ever needed  In future if need, we can add a Singulair night-time allergy med as well.  Keep a watch on the swallowing issue. Notify me if worsening or new concerns or symptoms.  If not improved after we treat the sinuses, then we can consider ordering further testing with swallow study imaging next.  Colon Cancer Screening: - For all adults age 41+ routine colon cancer screening is highly recommended.     - Recent guidelines from [place] recommend starting age of 33 - Early detection of colon cancer is important, because often there are no warning signs or symptoms, also if found early usually it can be cured. Late stage is hard to treat.  - If you are not interested in Colonoscopy screening (if done and normal you could be cleared for 5 to 10 years until next due), then Cologuard is an excellent alternative for screening test for Colon Cancer. It is highly sensitive for detecting DNA of colon cancer from even the earliest stages. Also, there is NO bowel prep required. - If Cologuard is NEGATIVE, then it is good for 3 years before next due - If Cologuard is POSITIVE, then it is strongly advised to get a Colonoscopy, which allows the GI doctor to locate the source of the cancer or polyp (even very early stage) and treat it by removing it. ------------------------- If you would like to proceed with Cologuard (stool DNA test) - FIRST, call your insurance company and tell them you want to check cost of Cologuard tell them CPT Code [phone] (it may be completely covered and you could get for no cost, OR max cost without any  coverage is about $600). Also, keep in mind if you do NOT open the kit, and decide not to do the test, you will NOT be charged, you should contact the company if you decide not to do the test. - If you want to proceed, you can notify us (phone message, [place], or at next visit) and we will order it for you. The test kit will be delivered to you house within about 1 week. Follow instructions to collect sample, you may call the company for any help or questions, 24/7 telephone support at [phone].   DUE for FASTING BLOOD WORK (no food or drink after midnight before the lab appointment, only water or coffee without cream/sugar on the morning of)  SCHEDULE "Lab Only" visit in the morning at the clinic for lab draw in [place]   - Make sure Lab Only appointment is at about 1 week before your next appointment, so that results will be available  For Lab Results, once available within 2-3 days of blood draw, you can can log in to MyChart online to view your results and a brief explanation. Also, we can discuss results at next follow-up visit.   Please schedule a Follow-up Appointment to: Return in about 6 weeks (around 11/19/2019) for Annual Physical.  If you have any other questions or concerns, please feel free to call the office or send a message through [place]. You may also schedule an earlier  appointment if necessary.  Additionally, you may be receiving a survey about your experience at our office within a few days to 1 week by e-mail or mail. We value your feedback.  Nobie Putnam, DO [place]

## 2019-10-08 NOTE — Assessment & Plan Note (Signed)
Chronic problem, rhinosinusitis Uncertain if allergic or other triggers, seems to be year-round  New order rx generic nasal steroid Flonase 2 sprays in each nostril daily  ADD Atrovent nasal spray decongestant 2 sprays in each nostril nightly for symptom relief, may use PRN during day  Continue Allegra Future consider add Singulair

## 2019-10-08 NOTE — Assessment & Plan Note (Signed)
Well controlled, chronic OSA on CPAP, now >4+ years - Good adherence to CPAP nightly - Continue current CPAP therapy, patient seems to be benefiting from therapy  

## 2019-10-08 NOTE — Assessment & Plan Note (Signed)
Stable without any recent flare up or episode Followed by [facility] Dr Graciela Husbands Managed on rhythm and rate control, w Flecainide and Diltiazem, and ranexa for symptoms Not on anticoagulation at this time.

## 2019-10-08 NOTE — Assessment & Plan Note (Signed)
BMI >38 with consistent dx Morbid Obesity due to OSA, Hyperlipidemia, HTN, Atrial Fibrillation Encouraging continued weight loss, already improving

## 2019-10-08 NOTE — Assessment & Plan Note (Signed)
Previous lipid panel mostly controlled, mild elevated LDL in 2017 Due for repeat  Plan: Encourage improved lifestyle - low carb/cholesterol, reduce portion size, continue improving regular exercise  Lipid labs in 6 wk

## 2019-10-08 NOTE — Progress Notes (Signed)
Subjective:    Patient ID: Jose Garcia, male    DOB: [birth year], 59 y.o.   MRN: [id number]  Jose Garcia is a 59 y.o. male presenting on 10/08/2019 for Establish Care (HTN)  Here to re-establish care with PCP here. Has been >3 years since last PCP visit after prior retired.   HPI   Paroxysmal Atrial Fibrillation Followed by Dr Jose Garcia [facility], 04/2019 - Currently reports that his AFib is well controlled. Currently not experiencing any arrhythmia - Meds: Flecainide 100mg  BID, Diltiazem 24 hr 120mg  daily, Ranolazine (Ranexa) 500mg  BID No significant known CAD. He had prior CT Cardiac score 0 negative 08/2017 He is doing well currently without symptoms or problem.  CHRONIC HTN: Reports home avg 120/80s, usually has higher reading at doctors office. Current Meds - Benazepril 20mg  BID, HCTZ 25mg  daily - see meds above for AFib as well.   Reports good compliance, took meds today. Tolerating well, w/o complaints. Lifestyle: - Weight down up to 40 lbs in past >1 year, now seems about down 20 - Diet: Weight watchers, diet, limiting portions, calories - Exercise: Not regular. Previously more treadmill exercise - causes him some hip pain Denies CP, dyspnea, HA, edema, dizziness / lightheadedness  Postnasal Drainage Chronic problem for few years. Has Flonase OTC some benefit. Worse in PM with drainage in back of throat and can interfere with him trying to fall asleep. Not having GERD or reflux symptoms. Heartburn.  HYPERLIPIDEMIA: - Reports no concerns. Last lipid panel 2017, mostly controlled, slightly elevated LDL Not on statin therapy On Omega 3  OSA, on CPAP - Patient reports prior history of dx OSA and on CPAP for >4 years, prior to treatment initial symptoms were snoring, daytime sleepiness and fatigue, has had sleep study PSG in 09/2015 scanned into chart - Today reports that sleep apnea is well controlled. Uses the CPAP machine every night. Tolerates the machine  well, and thinks that sleeps better with it and feels good. No new concerns or symptoms.  Dysphagia Reports some larger pieces of food or peanut butter crackers or larger pills, says this bothers him most days, if he doesn't have something to wash it down it is more difficult to swallow - says feels like it can get "stuck" or "hung up" but he is able to swallow with larger mouth full of water, and able to work around it. Problem been around for few years, not getting worse. - No history of tobacco use, smoking or smokeless   Health Maintenance: Due Hep C screening routine.  Colon CA Screening: Last colonoscopy reported to be about 2013, was told "clean" and "negative" unsure call back, but thought to be about 10 years. No report available. Now about 8 years later, asymptomatic. He would be eligible for Cologuard. Will consider  Depression screen [facility] 2/9 10/08/2019 05/05/2015  Decreased Interest 0 0  Down, Depressed, Hopeless 0 0  PHQ - 2 Score 0 0    Past Medical History:  Diagnosis Date  . Atrial fibrillation (HCC)   . Cardiomyopathy    Resolved 05/03/09  . Hyperlipidemia   . Hypertension 03/03/96  . Sleep apnea    Past Surgical History:  Procedure Laterality Date  . CARDIOVERSION     Social History   Socioeconomic History  . Marital status: Married    Spouse name: Not on file  . Number of children: 2  . Years of education: Not on file  . Highest education level: Not on file  Occupational History  . 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 Smoking status: Never Smoker  . Smokeless tobacco: Never Used  Substance and Sexual Activity  . Alcohol use: Yes    Alcohol/week: 0.0 standard drinks    Comment: Rare  . Drug use: No  . Sexual activity: Not on file  Other Topics Concern  . Not on file  Social History Narrative   Pleas Koch a lot.   Social Determinants of Health   Financial Resource Strain:   . Difficulty of Paying Living Expenses:   Food  Insecurity:   . Worried About Programme researcher, broadcasting/film/video in the Last Year:   . Barista in the Last Year:   Transportation Needs:   . Freight forwarder (Medical):   Marland Kitchen Lack of Transportation (Non-Medical):   Physical Activity:   . Days of Exercise per Week:   . Minutes of Exercise per Session:   Stress:   . Feeling of Stress :   Social Connections:   . Frequency of Communication with Friends and Family:   . Frequency of Social Gatherings with Friends and Family:   . Attends Religious Services:   . Active Member of Clubs or Organizations:   . Attends Banker Meetings:   Marland Kitchen Marital Status:   Intimate Partner Violence:   . Fear of Current or Ex-Partner:   . Emotionally Abused:   Marland Kitchen Physically Abused:   . Sexually Abused:    Family History  Problem Relation Age of Onset  . Hypertension Father   . Hypertension Mother   . Heart disease Mother   . Cancer Mother   . Diabetes Neg Hx   . Coronary artery disease Neg Hx    Current Outpatient Medications on File Prior to Visit  Medication Sig  . benazepril (LOTENSIN) 20 MG tablet Take 1 tablet (20 mg total) by mouth 2 (two) times daily.  Marland Kitchen diltiazem (CARDIZEM CD) 120 MG 24 hr capsule Take 1 capsule (120 mg total) by mouth daily.  . fexofenadine (ALLEGRA) 180 MG tablet Take 180 mg by mouth daily.  . flecainide (TAMBOCOR) 100 MG tablet TAKE 1 TABLET BY MOUTH TWICE DAILY  . hydrochlorothiazide (HYDRODIURIL) 25 MG tablet TAKE 1 TABLET(25 MG) BY MOUTH DAILY  . Multiple Vitamin (MULTIVITAMIN WITH MINERALS) TABS tablet Take 1 tablet by mouth daily.  . Omega-3 Fatty Acids (FISH OIL) 1000 MG CAPS Take 1 capsule by mouth daily.   . ranolazine (RANEXA) 500 MG 12 hr tablet Take 1 tablet (500 mg total) by mouth 2 (two) times daily.   No current facility-administered medications on file prior to visit.    Review of Systems Per HPI unless specifically indicated above     Objective:    BP 138/74 (BP Location: Left Arm, Cuff  Size: Normal)   Pulse (!) 56   Temp 98.2 F (36.8 C) (Temporal)   Resp 16   Ht 6' (1.829 m)   Wt 282 lb 9.6 oz (128.2 kg)   BMI 38.33 kg/m   Wt Readings from Last 3 Encounters:  10/08/19 282 lb 9.6 oz (128.2 kg)  05/01/19 271 lb (122.9 kg)  03/28/18 281 lb 4 oz (127.6 kg)    Physical Exam Vitals and nursing note reviewed.  Constitutional:      General: He is not in acute distress.    Appearance: He is well-developed. He is obese. He is not diaphoretic.     Comments: Well-appearing, comfortable, cooperative  HENT:     Head: Normocephalic and atraumatic.  Eyes:     General:        Right eye: No discharge.        Left eye: No discharge.     Conjunctiva/sclera: Conjunctivae normal.  Neck:     Thyroid: No thyromegaly.  Cardiovascular:     Rate and Rhythm: Normal rate and regular rhythm.     Heart sounds: Normal heart sounds. No murmur.     Comments: No ectopy Pulmonary:     Effort: Pulmonary effort is normal. No respiratory distress.     Breath sounds: Normal breath sounds. No wheezing or rales.  Musculoskeletal:        General: Normal range of motion.     Cervical back: Normal range of motion and neck supple.  Lymphadenopathy:     Cervical: No cervical adenopathy.  Skin:    General: Skin is warm and dry.     Findings: No erythema or rash.  Neurological:     Mental Status: He is alert and oriented to person, place, and time.  Psychiatric:        Behavior: Behavior normal.     Comments: Well groomed, good eye contact, normal speech and thoughts      ADDENDUM REPORT: 09/26/2017 09:36  EXAM: OVER-READ INTERPRETATION  CT CHEST  The following report is an over-read performed by radiologist Dr. Collene Leyden [facility] on 09/26/2017. This over-read does not include interpretation of cardiac or coronary anatomy or pathology. The coronary calcium score interpretation by the cardiologist is attached.  COMPARISON:  Heart is upper limits normal in size.   None  FINDINGS: Heart is upper limits normal in size. Visualized aorta is normal caliber. No adenopathy in the lower mediastinum or hila. Visualized lungs clear. No effusions.  Imaging into the upper abdomen shows no acute findings. Chest wall soft tissues are unremarkable. No acute bony abnormality. Mild degenerative spurring in the visualized midthoracic spine.  IMPRESSION: No acute or significant extracardiac abnormality.   Electronically Signed   By: Rolm Baptise M.D.   On: 09/26/2017 09:36  Results for orders placed or performed in visit on 35/57/32  Basic metabolic panel  Result Value Ref Range   Glucose 91 65 - 99 mg/dL   BUN 12 6 - 24 mg/dL   Creatinine, Ser 0.84 0.76 - 1.27 mg/dL   GFR calc non Af Amer 97 >59 mL/min/1.73   GFR calc Af Amer 112 >59 mL/min/1.73   BUN/Creatinine Ratio 14 9 - 20   Sodium 143 134 - 144 mmol/L   Potassium 3.9 3.5 - 5.2 mmol/L   Chloride 105 96 - 106 mmol/L   CO2 23 20 - 29 mmol/L   Calcium 9.3 8.7 - 10.2 mg/dL      Assessment & Plan:   Problem List Items Addressed This Visit    Post-nasal drainage    Chronic problem, rhinosinusitis Uncertain if allergic or other triggers, seems to be year-round  New order rx generic nasal steroid Flonase 2 sprays in each nostril daily  ADD Atrovent nasal spray decongestant 2 sprays in each nostril nightly for symptom relief, may use PRN during day  Continue Allegra Future consider add Singulair      Relevant Medications   fluticasone (FLONASE) 50 MCG/ACT nasal spray   ipratropium (ATROVENT) 0.06 % nasal spray   Paroxysmal atrial fibrillation (HCC)    Stable without any recent flare up or episode Followed by [facility] [address] Dr Caryl Comes Managed  on rhythm and rate control, w Flecainide and Diltiazem, and ranexa for symptoms Not on anticoagulation at this time.      OSA on CPAP    Well controlled, chronic OSA on CPAP, now >4+ years - Good adherence to CPAP nightly - Continue  current CPAP therapy, patient seems to be benefiting from therapy       Morbid obesity (HCC)    BMI >38 with consistent dx Morbid Obesity due to OSA, Hyperlipidemia, HTN, Atrial Fibrillation Encouraging continued weight loss, already improving      HYPERCHOLESTEROLEMIA    Previous lipid panel mostly controlled, mild elevated LDL in 2017 Due for repeat  Plan: Encourage improved lifestyle - low carb/cholesterol, reduce portion size, continue improving regular exercise  Lipid labs in 6 wk      Essential hypertension - Primary    Mildly elevated initial BP, repeat manual check improved but seems to have better readings at home. - Home BP readings reviewed  History of transient cardiomyopathy / paroxysmal AFib   Plan:  1. Continue current BP regimen - Benazepril 20mg  BID, HCTZ 25mg  daily, Diltiazem 120mg  daily (24hr) 2. Encourage improved lifestyle - low sodium diet, regular exercise 3. Continue monitor BP outside office, bring readings to next visit, if persistently >140/90 or new symptoms notify office sooner  F/u w cardiology, return here 6 weeks labs physical      Cardiomyopathy [facility])    Resolved History of transient cardiomyopathy with history of afib Followed by [facility] Dr          #Dysphagia, regarding this issue we can consider future follow-up if remains persistent problem, discussed options for modified barium swallow and SLP eval if indicated. Defer for now.  Meds ordered this encounter  Medications  . fluticasone (FLONASE) 50 MCG/ACT nasal spray    Sig: Place 2 sprays into both nostrils daily. Use for 4-6 weeks then stop and use seasonally or as needed.    Dispense:  48 g    Refill:  3  . ipratropium (ATROVENT) 0.06 % nasal spray    Sig: Place 2 sprays into both nostrils at bedtime as needed for rhinitis.    Dispense:  15 mL    Refill:  2    Follow up plan: Return in about 6 weeks (around 11/19/2019) for Annual Physical.  Future labs  ordered approx 11/2019 - CMET LIPID CBC A1c + Hep C PSA TSH  Jose Husbands, DO [facility] 10/08/2019, 10:20 AM

## 2019-11-12 ENCOUNTER — Other Ambulatory Visit: Payer: Self-pay

## 2019-11-12 ENCOUNTER — Other Ambulatory Visit: Payer: BLUE CROSS/BLUE SHIELD

## 2019-11-12 DIAGNOSIS — E7800 Pure hypercholesterolemia, unspecified: Secondary | ICD-10-CM | POA: Diagnosis not present

## 2019-11-12 DIAGNOSIS — R7309 Other abnormal glucose: Secondary | ICD-10-CM

## 2019-11-12 DIAGNOSIS — I480 Paroxysmal atrial fibrillation: Secondary | ICD-10-CM | POA: Diagnosis not present

## 2019-11-12 DIAGNOSIS — I10 Essential (primary) hypertension: Secondary | ICD-10-CM | POA: Diagnosis not present

## 2019-11-12 DIAGNOSIS — Z1159 Encounter for screening for other viral diseases: Secondary | ICD-10-CM

## 2019-11-12 DIAGNOSIS — Z0000 Encounter for general adult medical examination without abnormal findings: Secondary | ICD-10-CM | POA: Diagnosis not present

## 2019-11-12 DIAGNOSIS — R351 Nocturia: Secondary | ICD-10-CM | POA: Diagnosis not present

## 2019-11-13 LAB — COMPLETE METABOLIC PANEL WITH GFR
AG Ratio: 1.8 (calc) (ref 1.0–2.5)
ALT: 19 U/L (ref 9–46)
AST: 14 U/L (ref 10–35)
Albumin: 4.2 g/dL (ref 3.6–5.1)
Alkaline phosphatase (APISO): 40 U/L (ref 35–144)
BUN: 16 mg/dL (ref 7–25)
CO2: 28 mmol/L (ref 20–32)
Calcium: 9 mg/dL (ref 8.6–10.3)
Chloride: 107 mmol/L (ref 98–110)
Creat: 0.88 mg/dL (ref 0.70–1.33)
GFR, Est African American: 110 mL/min/{1.73_m2} (ref >=60)
GFR, Est Non African American: 95 mL/min/{1.73_m2} (ref >=60)
Globulin: 2.4 g/dL (calc) (ref 1.9–3.7)
Glucose, Bld: 106 mg/dL — ABNORMAL HIGH (ref 65–99)
Potassium: 3.9 mmol/L (ref 3.5–5.3)
Sodium: 141 mmol/L (ref 135–146)
Total Bilirubin: 0.7 mg/dL (ref 0.2–1.2)
Total Protein: 6.6 g/dL (ref 6.1–8.1)

## 2019-11-13 LAB — CBC WITH DIFFERENTIAL/PLATELET
Absolute Monocytes: 376 cells/uL (ref 200–950)
Basophils Absolute: 8 cells/uL (ref 0–200)
Basophils Relative: 0.2 %
Eosinophils Absolute: 60 cells/uL (ref 15–500)
Eosinophils Relative: 1.5 %
HCT: 43 % (ref 38.5–50.0)
Hemoglobin: 14.5 g/dL (ref 13.2–17.1)
Lymphs Abs: 892 cells/uL (ref 850–3900)
MCH: 33 pg (ref 27.0–33.0)
MCHC: 33.7 g/dL (ref 32.0–36.0)
MCV: 97.7 fL (ref 80.0–100.0)
MPV: 12 fL (ref 7.5–12.5)
Monocytes Relative: 9.4 %
Neutro Abs: 2664 cells/uL (ref 1500–7800)
Neutrophils Relative %: 66.6 %
Platelets: 145 10*3/uL (ref 140–400)
RBC: 4.4 10*6/uL (ref 4.20–5.80)
RDW: 11.9 % (ref 11.0–15.0)
Total Lymphocyte: 22.3 %
WBC: 4 10*3/uL (ref 3.8–10.8)

## 2019-11-13 LAB — HEMOGLOBIN A1C
Hgb A1c MFr Bld: 4.7 % of total Hgb (ref <5.7)
Mean Plasma Glucose: 88 (calc)
eAG (mmol/L): 4.9 (calc)

## 2019-11-13 LAB — LIPID PANEL
Cholesterol: 186 mg/dL (ref <200)
HDL: 46 mg/dL (ref >=40)
LDL Cholesterol (Calc): 124 mg/dL (calc) — ABNORMAL HIGH
Non-HDL Cholesterol (Calc): 140 mg/dL (calc) — ABNORMAL HIGH (ref <130)
Total CHOL/HDL Ratio: 4 (calc) (ref <5.0)
Triglycerides: 66 mg/dL (ref <150)

## 2019-11-13 LAB — TSH: TSH: 2.48 mIU/L (ref 0.40–4.50)

## 2019-11-13 LAB — HEPATITIS C ANTIBODY
Hepatitis C Ab: NONREACTIVE
SIGNAL TO CUT-OFF: 0.01 (ref <1.00)

## 2019-11-13 LAB — PSA: PSA: 0.6 ng/mL (ref <=4.0)

## 2019-11-19 ENCOUNTER — Encounter: Payer: Self-pay | Admitting: Family Medicine

## 2019-11-19 ENCOUNTER — Other Ambulatory Visit: Payer: Self-pay

## 2019-11-19 ENCOUNTER — Ambulatory Visit (INDEPENDENT_AMBULATORY_CARE_PROVIDER_SITE_OTHER): Payer: BC Managed Care – PPO | Admitting: Family Medicine

## 2019-11-19 VITALS — BP 122/55 | HR 69 | Temp 97.7°F | Resp 16 | Ht 72.0 in | Wt 280.6 lb

## 2019-11-19 DIAGNOSIS — G4733 Obstructive sleep apnea (adult) (pediatric): Secondary | ICD-10-CM | POA: Diagnosis not present

## 2019-11-19 DIAGNOSIS — Z1211 Encounter for screening for malignant neoplasm of colon: Secondary | ICD-10-CM | POA: Diagnosis not present

## 2019-11-19 DIAGNOSIS — I480 Paroxysmal atrial fibrillation: Secondary | ICD-10-CM

## 2019-11-19 DIAGNOSIS — Z0000 Encounter for general adult medical examination without abnormal findings: Secondary | ICD-10-CM | POA: Diagnosis not present

## 2019-11-19 DIAGNOSIS — Z9989 Dependence on other enabling machines and devices: Secondary | ICD-10-CM

## 2019-11-19 DIAGNOSIS — E7800 Pure hypercholesterolemia, unspecified: Secondary | ICD-10-CM

## 2019-11-19 NOTE — Patient Instructions (Addendum)
Thank you for coming to the office today.  Overall good lab results.  Normal Thyroid  Excellent A1c sugar  Recent Labs    11/12/19 0753  HGBA1C 4.7    Negative Hep C test  Normal blood counts, chemistry, kidney liver.  Slightly elevated LDL bad cholesterol 120s. Otherwise good cholesterol result.  You may benefit from a Statin cholesterol medicine in the next few years, for prevention.  Try to keep lifestyle improvements to control your cholesterol naturally and eventually these medicines can help prevent heart attack and stroke.  -----------------------------------------------------------------   Colon Cancer Screening: - For all adults age 73+ routine colon cancer screening is highly recommended.     - Recent guidelines from American Cancer Society recommend starting age of 85 - Early detection of colon cancer is important, because often there are no warning signs or symptoms, also if found early usually it can be cured. Late stage is hard to treat.  - If you are not interested in Colonoscopy screening (if done and normal you could be cleared for 5 to 10 years until next due), then Cologuard is an excellent alternative for screening test for Colon Cancer. It is highly sensitive for detecting DNA of colon cancer from even the earliest stages. Also, there is NO bowel prep required. - If Cologuard is NEGATIVE, then it is good for 3 years before next due - If Cologuard is POSITIVE, then it is strongly advised to get a Colonoscopy, which allows the GI doctor to locate the source of the cancer or polyp (even very early stage) and treat it by removing it. -------------------------  Ordered Cologuard  Follow instructions to collect sample, you may call the company for any help or questions, 24/7 telephone support at [phone].  DUE for FASTING BLOOD WORK (no food or drink after midnight before the lab appointment, only water or coffee without cream/sugar on the morning  of)  SCHEDULE "Lab Only" visit in the morning at the clinic for lab draw in 1 YEAR  - Make sure Lab Only appointment is at about 1 week before your next appointment, so that results will be available  For Lab Results, once available within 2-3 days of blood draw, you can can log in to MyChart online to view your results and a brief explanation. Also, we can discuss results at next follow-up visit.   Please schedule a Follow-up Appointment to: Return in about 1 year (around 11/18/2020) for Annual Physical.  If you have any other questions or concerns, please feel free to call the office or send a message through MyChart. You may also schedule an earlier appointment if necessary.  Additionally, you may be receiving a survey about your experience at our office within a few days to 1 week by e-mail or mail. We value your feedback.  Saralyn Pilar, DO [facility] [place]

## 2019-11-19 NOTE — Progress Notes (Signed)
Subjective:    Patient ID: Jose Garcia, male    DOB: 1961-04-21, 59 y.o.   MRN: [id number]  Jose Garcia is a 59 y.o. male presenting on 11/19/2019 for Annual Exam   HPI   Here for Annual Physical and Lab Review.  Paroxysmal Atrial Fibrillation [facility] - Currently reports that his AFib is well controlled. Currently not experiencing any arrhythmia - Meds: Flecainide 100mg  BID, Diltiazem 24 hr 120mg  daily, Ranolazine (Ranexa) 500mg  BID No significant known CAD. He had prior CT Cardiac score 0 negative 08/2017 He is doing well currently without symptoms or problem.  CHRONIC HTN: Reports home BP controlled, usually has higher reading at doctors office. Current Meds - Benazepril 20mg  BID, HCTZ 25mg  daily - see meds above for AFib as well.   Reports good compliance, took meds today. Tolerating well, w/o complaints. Lifestyle: - Diet: Weight watchers, diet, limiting portions, calories - Exercise: Not regular. Previously more treadmill exercise - causes him some hip pain Denies CP, dyspnea, HA, edema, dizziness / lightheadedness  HYPERLIPIDEMIA: - Reports no concerns. Last lipid panel 2021, mostly controlled, slightly elevated LDL Not on statin therapy On Omega 3  OSA, on CPAP - Patient reports prior history of dx OSA and on CPAP for >4 years, prior to treatment initial symptoms were snoring, daytime sleepiness and fatigue, has had sleep study PSG in 09/2015 scanned into chart - Today reports that sleep apnea is well controlled. Uses the CPAP machine every night. Tolerates the machine well, and thinks that sleeps better with it and feels good. No new concerns or symptoms.    Health Maintenance: Hep C screening negative  Colon CA Screening: Last colonoscopy reported to be about 2013, was told "clean" and "negative" unsure call back, but thought to be about 10 years. No report available. Now about 8 years later, asymptomatic.  He would be eligible for Cologuard. Request order today  Prostate CA Screening: Prior PSA 0.6 (2016). Last PSA 0.6 (11/12/19). Currently asymptomatic.  No known family history of prostate CA.    Dep[facility] 11/19/2019 10/08/2019 05/05/2015  Decreased Interest 0 0 0  Down, Depressed, Hopeless 0 0 0  PHQ - 2 Score 0 0 0    Past Medical History:  Diagnosis Date  . Atrial fibrillation (HCC)   . Cardiomyopathy    Resolved 05/03/09  . Hyperlipidemia   . Hypertension 03/03/96  . Sleep apnea    Past Surgical History:  Procedure Laterality Date  . CARDIOVERSION     Social History   Socioeconomic History  . Marital status: Married    Spouse name: Not on file  . Number of children: 2  . Years of education: Not on file  . Highest education level: Not on file  Occupational History  . 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 Smoking status: Never Smoker  . Smokeless tobacco: Never Used  Substance and Sexual Activity  . Alcohol use: Yes    Alcohol/week: 0.0 standard drinks    Comment: Rare  . Drug use: No  . Sexual activity: Not on file  Other Topics Concern  . Not on file  Social History Narrative   Pleas Kochravels a lot.   Social Determinants of Health   Financial Resource Strain:   . Difficulty of Paying Living Expenses:   Food Insecurity:   . Worried About Programme researcher, broadcasting/film/videounning Out of Food in the Last Year:   . [employer] Out  of Food in the Last Year:   Transportation Needs:   . Freight forwarder (Medical):   Marland Kitchen Lack of Transportation (Non-Medical):   Physical Activity:   . Days of Exercise per Week:   . Minutes of Exercise per Session:   Stress:   . Feeling of Stress :   Social Connections:   . Frequency of Communication with Friends and Family:   . Frequency of Social Gatherings with Friends and Family:   . Attends Religious Services:   . Active Member of Clubs or Organizations:   . Attends Banker Meetings:   Marland Kitchen Marital Status:     Intimate Partner Violence:   . Fear of Current or Ex-Partner:   . Emotionally Abused:   Marland Kitchen Physically Abused:   . Sexually Abused:    Family History  Problem Relation Age of Onset  . Hypertension Father   . Hypertension Mother   . Heart disease Mother   . Cancer Mother   . Diabetes Neg Hx   . Coronary artery disease Neg Hx   . Colon cancer Neg Hx   . Prostate cancer Neg Hx    Current Outpatient Medications on File Prior to Visit  Medication Sig  . benazepril (LOTENSIN) 20 MG tablet Take 1 tablet (20 mg total) by mouth 2 (two) times daily.  Marland Kitchen diltiazem (CARDIZEM CD) 120 MG 24 hr capsule Take 1 capsule (120 mg total) by mouth daily.  . fexofenadine (ALLEGRA) 180 MG tablet Take 180 mg by mouth daily.  . flecainide (TAMBOCOR) 100 MG tablet TAKE 1 TABLET BY MOUTH TWICE DAILY  . fluticasone (FLONASE) 50 MCG/ACT nasal spray Place 2 sprays into both nostrils daily. Use for 4-6 weeks then stop and use seasonally or as needed.  . hydrochlorothiazide (HYDRODIURIL) 25 MG tablet TAKE 1 TABLET(25 MG) BY MOUTH DAILY  . ipratropium (ATROVENT) 0.06 % nasal spray Place 2 sprays into both nostrils at bedtime as needed for rhinitis.  . Multiple Vitamin (MULTIVITAMIN WITH MINERALS) TABS tablet Take 1 tablet by mouth daily.  . Omega-3 Fatty Acids (FISH OIL) 1000 MG CAPS Take 1 capsule by mouth daily.   . ranolazine (RANEXA) 500 MG 12 hr tablet Take 1 tablet (500 mg total) by mouth 2 (two) times daily.   No current facility-administered medications on file prior to visit.    Review of Systems  Constitutional: Negative for activity change, appetite change, chills, diaphoresis, fatigue and fever.  HENT: Negative for congestion and hearing loss.   Eyes: Negative for visual disturbance.  Respiratory: Negative for cough, chest tightness, shortness of breath and wheezing.   Cardiovascular: Negative for chest pain, palpitations and leg swelling.  Gastrointestinal: Negative for abdominal pain, constipation,  diarrhea, nausea and vomiting.  Endocrine: Negative for cold intolerance.  Genitourinary: Negative for difficulty urinating, dysuria, frequency and hematuria.  Musculoskeletal: Negative for arthralgias and neck pain.  Skin: Negative for rash.  Allergic/Immunologic: Negative for environmental allergies.  Neurological: Negative for dizziness, weakness, light-headedness, numbness and headaches.  Hematological: Negative for adenopathy.  Psychiatric/Behavioral: Negative for behavioral problems, dysphoric mood and sleep disturbance.   Per HPI unless specifically indicated above      Objective:    BP (!) 122/55   Pulse 69   Temp 97.7 F (36.5 C) (Temporal)   Resp 16   Ht 6' (1.829 m)   Wt 280 lb 9.6 oz (127.3 kg)   SpO2 98%   BMI 38.06 kg/m   Wt Readings from Last 3 Encounters:  11/19/19  280 lb 9.6 oz (127.3 kg)  10/08/19 282 lb 9.6 oz (128.2 kg)  05/01/19 271 lb (122.9 kg)    Physical Exam Vitals and nursing note reviewed.  Constitutional:      General: He is not in acute distress.    Appearance: He is well-developed. He is not diaphoretic.     Comments: Well-appearing, comfortable, cooperative  HENT:     Head: Normocephalic and atraumatic.  Eyes:     General:        Right eye: No discharge.        Left eye: No discharge.     Conjunctiva/sclera: Conjunctivae normal.     Pupils: Pupils are equal, round, and reactive to light.  Neck:     Thyroid: No thyromegaly.     Vascular: No carotid bruit.  Cardiovascular:     Rate and Rhythm: Normal rate and regular rhythm.     Heart sounds: Normal heart sounds. No murmur.  Pulmonary:     Effort: Pulmonary effort is normal. No respiratory distress.     Breath sounds: Normal breath sounds. No wheezing or rales.  Abdominal:     General: Bowel sounds are normal. There is no distension.     Palpations: Abdomen is soft. There is no mass.     Tenderness: There is no abdominal tenderness.  Musculoskeletal:        General: No  tenderness. Normal range of motion.     Cervical back: Normal range of motion and neck supple.     Right lower leg: No edema.     Left lower leg: No edema.     Comments: Upper / Lower Extremities: - Normal muscle tone, strength bilateral upper extremities 5/5, lower extremities 5/5  Lymphadenopathy:     Cervical: No cervical adenopathy.  Skin:    General: Skin is warm and dry.     Findings: No erythema or rash.  Neurological:     Mental Status: He is alert and oriented to person, place, and time.     Comments: Distal sensation intact to light touch all extremities  Psychiatric:        Behavior: Behavior normal.     Comments: Well groomed, good eye contact, normal speech and thoughts       Results for orders placed or performed in visit on 11/12/19  TSH  Result Value Ref Range   TSH 2.48 0.40 - 4.50 mIU/L  PSA  Result Value Ref Range   PSA 0.6 < OR = 4.0 ng/mL  Lipid panel  Result Value Ref Range   Cholesterol 186 <200 mg/dL   HDL 46 > OR = 40 mg/dL   Triglycerides 66 <782 mg/dL   LDL Cholesterol (Calc) 124 (H) mg/dL (calc)   Total CHOL/HDL Ratio 4.0 <5.0 (calc)   Non-HDL Cholesterol (Calc) 140 (H) <130 mg/dL (calc)  COMPLETE METABOLIC PANEL WITH GFR  Result Value Ref Range   Glucose, Bld 106 (H) 65 - 99 mg/dL   BUN 16 7 - 25 mg/dL   Creat 9.56 2.13 - 0.86 mg/dL   GFR, Est Non African American 95 > OR = 60 mL/min/1.60m2   GFR, Est African American 110 > OR = 60 mL/min/1.10m2   BUN/Creatinine Ratio NOT APPLICABLE 6 - 22 (calc)   Sodium 141 135 - 146 mmol/L   Potassium 3.9 3.5 - 5.3 mmol/L   Chloride 107 98 - 110 mmol/L   CO2 28 20 - 32 mmol/L   Calcium 9.0 8.6 - 10.3 mg/dL  Total Protein 6.6 6.1 - 8.1 g/dL   Albumin 4.2 3.6 - 5.1 g/dL   Globulin 2.4 1.9 - 3.7 g/dL (calc)   AG Ratio 1.8 1.0 - 2.5 (calc)   Total Bilirubin 0.7 0.2 - 1.2 mg/dL   Alkaline phosphatase (APISO) 40 35 - 144 U/L   AST 14 10 - 35 U/L   ALT 19 9 - 46 U/L  CBC with Differential/Platelet    Result Value Ref Range   WBC 4.0 3.8 - 10.8 Thousand/uL   RBC 4.40 4.20 - 5.80 Million/uL   Hemoglobin 14.5 13.2 - 17.1 g/dL   HCT 34.7 42.5 - 95.6 %   MCV 97.7 80.0 - 100.0 fL   MCH 33.0 27.0 - 33.0 pg   MCHC 33.7 32.0 - 36.0 g/dL   RDW 38.7 56.4 - 33.2 %   Platelets 145 140 - 400 Thousand/uL   MPV 12.0 7.5 - 12.5 fL   Neutro Abs 2,664 1,500 - 7,800 cells/uL   Lymphs Abs 892 850 - 3,900 cells/uL   Absolute Monocytes 376 200 - 950 cells/uL   Eosinophils Absolute 60 15 - 500 cells/uL   Basophils Absolute 8 0 - 200 cells/uL   Neutrophils Relative % 66.6 %   Total Lymphocyte 22.3 %   Monocytes Relative 9.4 %   Eosinophils Relative 1.5 %   Basophils Relative 0.2 %  Hemoglobin A1c  Result Value Ref Range   Hgb A1c MFr Bld 4.7 <5.7 % of total Hgb   Mean Plasma Glucose 88 (calc)   eAG (mmol/L) 4.9 (calc)  Hepatitis C antibody  Result Value Ref Range   Hepatitis C Ab NON-REACTIVE NON-REACTI   SIGNAL TO CUT-OFF 0.01 <1.00      Assessment & Plan:   Problem List Items Addressed This Visit    Paroxysmal atrial fibrillation (HCC)    Stable without any recent flare up or episode Followed by [facility] [place] Dr Graciela Husbands Managed on rhythm and rate control, w Flecainide and Diltiazem, and ranexa for symptoms Not on anticoagulation at this time.      OSA on CPAP    Well controlled, chronic OSA on CPAP, now >4+ years - Good adherence to CPAP nightly - Continue current CPAP therapy, patient seems to be benefiting from therapy       HYPERCHOLESTEROLEMIA    Mostly controlled cholesterol on lifestyle. Not on statin Slightly elevated LDL Last lipid panel 11/2019 Calculated ASCVD 10 yr risk score 7.8%  Plan: 1. Discussed ASCVD future risk calculation, offered statin, will defer for now. 2. Encourage improved lifestyle - low carb/cholesterol, reduce portion size, continue improving regular exercise Follow-up yearly        Other Visit Diagnoses    Annual physical exam     -  Primary   Screening for colon cancer       Relevant Orders   Cologuard      Updated Health Maintenance information Reviewed recent lab results with patient Encouraged improvement to lifestyle with diet and exercise - Goal of weight loss   Due for routine colon cancer screening. Last colonoscopy >8 year ago - Discussion today about recommendations for either Colonoscopy or Cologuard screening, benefits and risks of screening, interested in Cologuard, understands that if positive then recommendation is for diagnostic colonoscopy to follow-up. - Ordered Cologuard today   No orders of the defined types were placed in this encounter.    Follow up plan: Return in about 1 year (around 11/18/2020) for Annual Physical.  Saralyn Pilar,  DO [facility] 11/19/2019, 9:42 AM

## 2019-11-20 NOTE — Assessment & Plan Note (Signed)
Stable without any recent flare up or episode Followed by [facility] [place] Dr Klein Managed on rhythm and rate control, w Flecainide and Diltiazem, and ranexa for symptoms Not on anticoagulation at this time. 

## 2019-11-20 NOTE — Assessment & Plan Note (Signed)
Well controlled, chronic OSA on CPAP, now >4+ years - Good adherence to CPAP nightly - Continue current CPAP therapy, patient seems to be benefiting from therapy

## 2019-11-20 NOTE — Assessment & Plan Note (Signed)
Mostly controlled cholesterol on lifestyle. Not on statin Slightly elevated LDL Last lipid panel 11/2019 Calculated ASCVD 10 yr risk score 7.8%  Plan: 1. Discussed ASCVD future risk calculation, offered statin, will defer for now. 2. Encourage improved lifestyle - low carb/cholesterol, reduce portion size, continue improving regular exercise Follow-up yearly

## 2019-12-25 DIAGNOSIS — H524 Presbyopia: Secondary | ICD-10-CM | POA: Diagnosis not present

## 2019-12-25 DIAGNOSIS — H52213 Irregular astigmatism, bilateral: Secondary | ICD-10-CM | POA: Diagnosis not present

## 2019-12-25 DIAGNOSIS — H5203 Hypermetropia, bilateral: Secondary | ICD-10-CM | POA: Diagnosis not present

## 2020-01-16 ENCOUNTER — Telehealth: Payer: Self-pay

## 2020-01-16 DIAGNOSIS — Z1211 Encounter for screening for malignant neoplasm of colon: Secondary | ICD-10-CM

## 2020-01-16 NOTE — Telephone Encounter (Signed)
Copied from CRM [id number]. Topic: General - Other >> Jan 15, 2020  9:59 AM Gwenlyn Fudge wrote: Reason for CRM: Pts wife called stating that the pt was supposed to receive coliguard and has not heard anything else about it. She is requesting to have a call back about next steps. Please advise.

## 2020-01-16 NOTE — Telephone Encounter (Signed)
Re ordered will sign and fax  Saralyn Pilar, DO [facility] 01/16/2020, 1:54 PM

## 2020-01-16 NOTE — Telephone Encounter (Signed)
Spoke to [facility], they had not received any cologuard box, can you reorder and sign, told her call back in week if she don't receive it. Thanks

## 2020-01-27 ENCOUNTER — Encounter: Payer: Self-pay | Admitting: Family Medicine

## 2020-01-27 DIAGNOSIS — Z1211 Encounter for screening for malignant neoplasm of colon: Secondary | ICD-10-CM | POA: Diagnosis not present

## 2020-01-27 LAB — COLOGUARD: Cologuard: NEGATIVE

## 2020-01-28 LAB — COLOGUARD: Cologuard: NEGATIVE

## 2020-01-30 LAB — COLOGUARD: COLOGUARD: NEGATIVE

## 2020-02-04 ENCOUNTER — Encounter: Payer: Self-pay | Admitting: Family Medicine

## 2020-02-06 ENCOUNTER — Other Ambulatory Visit: Payer: Self-pay | Admitting: Family Medicine

## 2020-02-06 DIAGNOSIS — R0982 Postnasal drip: Secondary | ICD-10-CM

## 2020-02-06 NOTE — Telephone Encounter (Signed)
Requested medication (s) are due for refill today: Yes  Requested medication (s) are on the active medication list: Yes  Last refill:  10/08/19  Future visit scheduled: No  Notes to clinic:  See request.    Requested Prescriptions  Pending Prescriptions Disp Refills   ipratropium (ATROVENT) 0.06 % nasal spray [Pharmacy Med Name: IPRATROPIUM 0.06% NAS SP (165)] 15 mL 2    Sig: USE 2 SPRAYS IN EACH NOSTRIL AT BEDTIME AS NEEDED FOR RHINITIS      Off-Protocol Failed - 02/06/2020  3:12 AM      Failed - Medication not assigned to a protocol, review manually.      Passed - Valid encounter within last 12 months    Recent Outpatient Visits           2 months ago Annual physical exam   [facility] Smitty Cords, DO   4 months ago Essential hypertension   [facility] [place], Netta Neat, DO   4 years ago Essential hypertension   [facility] Janeann Forehand., MD   4 years ago Annual physical exam   [facility] Janeann Forehand., MD             Off-Protocol Failed - 02/06/2020  3:12 AM      Failed - Medication not assigned to a protocol, review manually.      Passed - Valid encounter within last 12 months    Recent Outpatient Visits           2 months ago Annual physical exam   [facility] Smitty Cords, DO   4 months ago Essential hypertension   [facility] [place], Netta Neat, DO   4 years ago Essential hypertension   [facility] Juanetta Gosling, Teresita Madura., MD   4 years ago Annual physical exam   [facility] Janeann Forehand., MD

## 2020-02-06 NOTE — Telephone Encounter (Signed)
Requested medication (s) are due for refill today: Yes  Requested medication (s) are on the active medication list: Yes  Last refill:  10/08/19  Future visit scheduled: No  Notes to clinic:  See request.    Requested Prescriptions  Pending Prescriptions Disp Refills   ipratropium (ATROVENT) 0.06 % nasal spray [Pharmacy Med Name: IPRATROPIUM 0.06% NAS SP (165)] 15 mL 2    Sig: USE 2 SPRAYS IN EACH NOSTRIL AT BEDTIME AS NEEDED FOR RHINITIS      Off-Protocol Failed - 02/06/2020  8:38 AM      Failed - Medication not assigned to a protocol, review manually.      Passed - Valid encounter within last 12 months    Recent Outpatient Visits           2 months ago Annual physical exam   [facility] Smitty Cords, DO   4 months ago Essential hypertension   [facility] [place], Netta Neat, DO   4 years ago Essential hypertension   [facility] Janeann Forehand., MD   4 years ago Annual physical exam   [facility] Janeann Forehand., MD             Off-Protocol Failed - 02/06/2020  8:38 AM      Failed - Medication not assigned to a protocol, review manually.      Passed - Valid encounter within last 12 months    Recent Outpatient Visits           2 months ago Annual physical exam   [facility] Smitty Cords, DO   4 months ago Essential hypertension   [facility] [place], Netta Neat, DO   4 years ago Essential hypertension   [facility] Juanetta Gosling, Teresita Madura., MD   4 years ago Annual physical exam   [facility] Janeann Forehand., MD

## 2020-04-01 ENCOUNTER — Other Ambulatory Visit: Payer: Self-pay | Admitting: Internal Medicine

## 2020-06-13 ENCOUNTER — Other Ambulatory Visit: Payer: Self-pay | Admitting: Internal Medicine

## 2020-06-14 ENCOUNTER — Telehealth: Payer: Self-pay | Admitting: Internal Medicine

## 2020-06-14 MED ORDER — HYDROCHLOROTHIAZIDE 25 MG PO TABS: 25.0000 mg | ORAL_TABLET | Freq: Every day | ORAL | 0 refills | Status: DC

## 2020-06-14 MED ORDER — BENAZEPRIL HCL 20 MG PO TABS: 20.0000 mg | ORAL_TABLET | Freq: Two times a day (BID) | ORAL | 0 refills | Status: DC

## 2020-06-14 MED ORDER — RANOLAZINE ER 500 MG PO TB12: 500.0000 mg | ORAL_TABLET | Freq: Two times a day (BID) | ORAL | 0 refills | Status: DC

## 2020-06-14 MED ORDER — DILTIAZEM HCL ER COATED BEADS 120 MG PO CP24: 120.0000 mg | ORAL_CAPSULE | Freq: Every day | ORAL | 0 refills | Status: DC

## 2020-06-14 MED ORDER — FLECAINIDE ACETATE 100 MG PO TABS: ORAL_TABLET | ORAL | 0 refills | Status: DC

## 2020-06-14 NOTE — Telephone Encounter (Signed)
*  STAT* If patient is at the pharmacy, call can be transferred to refill team.   1. Which medications need to be refilled? (please list name of each medication and dose if known)  Benazepril 20 MG Diltiazem 120 MG Flecainide 100 MG  Hydrochlorothiazide 25 MG Ranolazine 500 MG  2. Which pharmacy/location (including street and city if local pharmacy) is medication to be sent to? [facility] in [place]   3. Do they need a 30 day or 90 day supply? 90 day   Patient scheduled to see Dr Graciela Husbands on 06/24/20

## 2020-06-14 NOTE — Telephone Encounter (Signed)
Rx request sent to pharmacy. Sent 90 days with no refills. Pt has appointment in 10 days with Dr. Graciela Husbands.

## 2020-06-17 ENCOUNTER — Other Ambulatory Visit: Payer: Self-pay | Admitting: Internal Medicine

## 2020-06-17 NOTE — Telephone Encounter (Signed)
This is a [place] pt 

## 2020-06-24 ENCOUNTER — Other Ambulatory Visit: Payer: Self-pay

## 2020-06-24 ENCOUNTER — Ambulatory Visit (INDEPENDENT_AMBULATORY_CARE_PROVIDER_SITE_OTHER): Payer: BC Managed Care – PPO | Admitting: Internal Medicine

## 2020-06-24 ENCOUNTER — Encounter: Payer: Self-pay | Admitting: Internal Medicine

## 2020-06-24 VITALS — BP 148/70 | HR 58 | Ht 72.0 in | Wt 285.0 lb

## 2020-06-24 DIAGNOSIS — I480 Paroxysmal atrial fibrillation: Secondary | ICD-10-CM | POA: Diagnosis not present

## 2020-06-24 NOTE — Progress Notes (Signed)
1      Patient Care Team: Smitty Cords, DO as PCP - General (Family Medicine)   HPI  Jose Garcia is a 59 y.o. male Is seen in followup forparoxysmal atrial fibrillation associated with a transient cardiomyopathy.  On flecainide with most recent cardioversion 1/16.  Not on anticoagulation with a CHA2DS2-VASc score of 1 for treated hypertension  Has maintained sinus rhythm without any arrhythmias of which he is aware.  The patient denies chest pain, shortness of breath, nocturnal dyspnea, orthopnea or peripheral edema.  There have been no lightheadedness or syncope.    Weight remains an issue.  Treated OSA  Date                PR  QRSd dose  6/15  94 o  2/16  104 100   3/16  116 150 bid decreased 2/2 QRSd   6/16  116 100  7/16  110 100  12/17`  108 100  7/18  92 100  3/19 180 114 100  9/19 164 102 100  10/20  170 102 100  12/21 188 98 (disagree with reading) 100     DATE TEST EF   2/15 TEE  50-55 %   1/16 Myoview  No perfusion defects  3//16 Echo   55-60 % `  3/19 CaScore  0      Lipid status begged risk stratification >>As above and no lipids indicated       Thromboembolic risk factors ( HTN-1) for a CHADSVASc Score of 1 >>  No anticoagulation   Date K LDL  12/17 4.1 124  7/18  4.3   9/19 3.7   5/21 3.9         Past Medical History:  Diagnosis Date  . Atrial fibrillation (HCC)   . Cardiomyopathy    Resolved 05/03/09  . Hyperlipidemia   . Hypertension 03/03/96  . Sleep apnea     Past Surgical History:  Procedure Laterality Date  . CARDIOVERSION      Current Outpatient Medications  Medication Sig Dispense Refill  . benazepril (LOTENSIN) 20 MG tablet Take 1 tablet (20 mg total) by mouth 2 (two) times daily. 180 tablet 0  . diltiazem (CARDIZEM CD) 120 MG 24 hr capsule Take 1 capsule (120 mg total) by mouth daily. Please keep your upcoming appointment with Dr. Graciela Husbands for refills. 90 capsule 0  . fexofenadine (ALLEGRA) 180 MG tablet Take  180 mg by mouth daily.    . flecainide (TAMBOCOR) 100 MG tablet TAKE 1 TABLET BY MOUTH TWICE DAILY 180 tablet 0  . fluticasone (FLONASE) 50 MCG/ACT nasal spray Place 2 sprays into both nostrils daily. Use for 4-6 weeks then stop and use seasonally or as needed. 48 g 3  . hydrochlorothiazide (HYDRODIURIL) 25 MG tablet Take 1 tablet (25 mg total) by mouth daily. 90 tablet 0  . ipratropium (ATROVENT) 0.06 % nasal spray USE 2 SPRAYS IN EACH NOSTRIL AT BEDTIME AS NEEDED FOR RHINITIS 15 mL 2  . Multiple Vitamin (MULTIVITAMIN WITH MINERALS) TABS tablet Take 1 tablet by mouth daily.    . Omega-3 Fatty Acids (FISH OIL) 1000 MG CAPS Take 1 capsule by mouth daily.     . ranolazine (RANEXA) 500 MG 12 hr tablet Take 1 tablet (500 mg total) by mouth 2 (two) times daily. 180 tablet 0   No current facility-administered medications for this visit.    Allergies  Allergen Reactions  . Shellfish Allergy Itching    Review  of Systems negative except from HPI and PMH  Physical Exam BP (!) 148/70 (BP Location: Left Arm, Patient Position: Sitting, Cuff Size: Large)   Pulse (!) 58   Ht 6' (1.829 m)   Wt 285 lb (129.3 kg)   SpO2 98%   BMI 38.65 kg/m    Well developed and obese   in no acute distress HENT normal Neck supple with JVP-  Flat  Clear Regular rate and rhythm, no murmurs or gallops Abd-soft with active BS No Clubbing cyanosis edema Skin-warm and dry A & Oriented  Grossly normal sensory and motor function  ECG sinus @ 58 19/10/44  Assessment and  Plan Atrial fibrillation paroxysmal-- RVR on flecainide/ranolazine   Hypertension  Hyperlipidemia >> Ca score 0 3/19  Obesity   No interval afib  BP at home 130's/80's  Encourage weight loss  The QRSd on ECG of 118 msec seems long, but if so would require downtitration of his flecainide; we will see him again in 6 mon

## 2020-06-24 NOTE — Patient Instructions (Addendum)
Medication Instructions:  NONE  *If you need a refill on your cardiac medications before your next appointment, please call your pharmacy*   Lab Work: NONE If you have labs (blood work) drawn today and your tests are completely normal, you will receive your results only by: Marland Kitchen MyChart Message (if you have MyChart) OR . A paper copy in the mail If you have any lab test that is abnormal or we need to change your treatment, we will call you to review the results.   Testing/Procedures: None   Follow-Up: At [facility], you and your health needs are our priority.  As part of our continuing mission to provide you with exceptional heart care, we have created designated Provider Care Teams.  These Care Teams include your primary Cardiologist (physician) and Advanced Practice Providers (APPs -  Physician Assistants and Nurse Practitioners) who all work together to provide you with the care you need, when you need it.  We recommend signing up for the patient portal called "MyChart".  Sign up information is provided on this After Visit Summary.  MyChart is used to connect with patients for Virtual Visits (Telemedicine).  Patients are able to view lab/test results, encounter notes, upcoming appointments, etc.  Non-urgent messages can be sent to your provider as well.   To learn more about what you can do with MyChart, go to [email or web address].    Your next appointment:   6 MONTH F/U  The format for your next appointment:   In Person  Provider:   You may see Dr.Klien or one of the following Advanced Practice Providers on your designated Care Team:    Nicolasa Ducking, NP  Eula Listen, PA-C  Marisue Ivan, PA-C  [place], [place]  Gillian Shields, NP    Other Instructions

## 2020-07-18 ENCOUNTER — Other Ambulatory Visit: Payer: Self-pay | Admitting: Internal Medicine

## 2020-09-09 ENCOUNTER — Other Ambulatory Visit: Payer: Self-pay | Admitting: Internal Medicine

## 2020-09-12 ENCOUNTER — Other Ambulatory Visit: Payer: Self-pay | Admitting: Internal Medicine

## 2020-09-23 ENCOUNTER — Telehealth: Payer: Self-pay | Admitting: Internal Medicine

## 2020-09-23 MED ORDER — FLECAINIDE ACETATE 100 MG PO TABS: ORAL_TABLET | ORAL | 0 refills | Status: DC

## 2020-09-23 NOTE — Telephone Encounter (Signed)
Received fax from [facility] in [place] requesting refills for Flecainide 100 mg. Rx request sent to pharmacy.

## 2020-10-01 ENCOUNTER — Other Ambulatory Visit: Payer: Self-pay | Admitting: Internal Medicine

## 2020-10-01 NOTE — Telephone Encounter (Signed)
Rx request sent to pharmacy.  

## 2020-10-28 ENCOUNTER — Other Ambulatory Visit: Payer: Self-pay

## 2020-10-28 MED ORDER — RANOLAZINE ER 500 MG PO TB12: 500.0000 mg | ORAL_TABLET | Freq: Two times a day (BID) | ORAL | 3 refills | Status: DC

## 2020-11-25 DIAGNOSIS — L570 Actinic keratosis: Secondary | ICD-10-CM | POA: Diagnosis not present

## 2020-11-25 DIAGNOSIS — Z85828 Personal history of other malignant neoplasm of skin: Secondary | ICD-10-CM | POA: Diagnosis not present

## 2020-11-25 DIAGNOSIS — L578 Other skin changes due to chronic exposure to nonionizing radiation: Secondary | ICD-10-CM | POA: Diagnosis not present

## 2020-11-25 DIAGNOSIS — L918 Other hypertrophic disorders of the skin: Secondary | ICD-10-CM | POA: Diagnosis not present

## 2020-12-09 ENCOUNTER — Other Ambulatory Visit: Payer: Self-pay | Admitting: Internal Medicine

## 2020-12-09 MED ORDER — HYDROCHLOROTHIAZIDE 25 MG PO TABS: 25.0000 mg | ORAL_TABLET | Freq: Every day | ORAL | 1 refills | Status: DC

## 2020-12-09 NOTE — Telephone Encounter (Signed)
Pt last seen in office 06/24/20 with Dr. Graciela Husbands.  Recall in place for 6 mo to be scheduled 12/2020.  Refill sent for HCTZ.  Forwarded message to scheduling to contact pt for appt.

## 2020-12-22 ENCOUNTER — Other Ambulatory Visit: Payer: Self-pay | Admitting: Internal Medicine

## 2021-01-06 ENCOUNTER — Other Ambulatory Visit: Payer: Self-pay | Admitting: *Deleted

## 2021-01-06 MED ORDER — DILTIAZEM HCL ER COATED BEADS 120 MG PO CP24: ORAL_CAPSULE | ORAL | 0 refills | Status: DC

## 2021-01-21 ENCOUNTER — Other Ambulatory Visit: Payer: Self-pay | Admitting: *Deleted

## 2021-01-21 ENCOUNTER — Other Ambulatory Visit: Payer: Self-pay | Admitting: Internal Medicine

## 2021-01-21 MED ORDER — RANOLAZINE ER 500 MG PO TB12
500.0000 mg | ORAL_TABLET | Freq: Two times a day (BID) | ORAL | 0 refills | Status: DC
Start: 2021-01-21 — End: 2021-02-10

## 2021-02-10 ENCOUNTER — Encounter: Payer: Self-pay | Admitting: Internal Medicine

## 2021-02-10 ENCOUNTER — Ambulatory Visit (INDEPENDENT_AMBULATORY_CARE_PROVIDER_SITE_OTHER): Payer: BC Managed Care – PPO | Admitting: Internal Medicine

## 2021-02-10 ENCOUNTER — Other Ambulatory Visit: Payer: Self-pay

## 2021-02-10 VITALS — BP 148/80 | HR 56 | Ht 72.0 in | Wt 280.0 lb

## 2021-02-10 DIAGNOSIS — I480 Paroxysmal atrial fibrillation: Secondary | ICD-10-CM

## 2021-02-10 MED ORDER — BENAZEPRIL HCL 20 MG PO TABS: ORAL_TABLET | ORAL | 1 refills | Status: DC

## 2021-02-10 MED ORDER — HYDROCHLOROTHIAZIDE 25 MG PO TABS: ORAL_TABLET | ORAL | 2 refills | Status: DC

## 2021-02-10 MED ORDER — DILTIAZEM HCL ER COATED BEADS 120 MG PO CP24: ORAL_CAPSULE | ORAL | 2 refills | Status: DC

## 2021-02-10 MED ORDER — FLECAINIDE ACETATE 100 MG PO TABS: 100.0000 mg | ORAL_TABLET | Freq: Two times a day (BID) | ORAL | 1 refills | Status: DC

## 2021-02-10 MED ORDER — RANOLAZINE ER 500 MG PO TB12: 500.0000 mg | ORAL_TABLET | Freq: Two times a day (BID) | ORAL | 2 refills | Status: DC

## 2021-02-10 NOTE — Patient Instructions (Signed)
Medication Instructions:  Your physician recommends that you continue on your current medications as directed. Please refer to the Current Medication list given to you today.  *If you need a refill on your cardiac medications before your next appointment, please call your pharmacy*   Lab Work: BMP today  If you have labs (blood work) drawn today and your tests are completely normal, you will receive your results only by: MyChart Message (if you have MyChart) OR A paper copy in the mail If you have any lab test that is abnormal or we need to change your treatment, we will call you to review the results.   Testing/Procedures: None ordered   Follow-Up: At [facility] you and your health needs are our priority.  As part of our continuing mission to provide you with exceptional heart care, we have created designated Provider Care Teams.  These Care Teams include your primary Cardiologist (physician) and Advanced Practice Providers (APPs -  Physician Assistants and Nurse Practitioners) who all work together to provide you with the care you need, when you need it.  We recommend signing up for the patient portal called "MyChart".  Sign up information is provided on this After Visit Summary.  MyChart is used to connect with patients for Virtual Visits (Telemedicine).  Patients are able to view lab/test results, encounter notes, upcoming appointments, etc.  Non-urgent messages can be sent to your provider as well.   To learn more about what you can do with MyChart, go to [email or web address].    Your next appointment:   Your physician wants you to follow-up in: 1 year You will receive a reminder letter in the mail two months in advance. If you don't receive a letter, please call our office to schedule the follow-up appointment.   The format for your next appointment:   In Person  Provider:   Sherryl Manges, MD  Other Instructions N/A

## 2021-02-10 NOTE — Progress Notes (Signed)
Patient ID: RAMAR NOBREGA, male   DOB: 12/15/1960, 60 y.o.   MRN: [id number]      Patient Care Team: Smitty Cords, DO as PCP - General (Family Medicine)   HPI  Jose Garcia is a 60 y.o. male Is seen in followup forparoxysmal atrial fibrillation associated with a transient cardiomyopathy.  On flecainide with most recent cardioversion 1/16.  Not on anticoagulation with a CHA2DS2-VASc score of 1 for treated hypertension    Weight remains an issue.  Treated OSA  Today, the patient denies chest pain, shortness of breath, nocturnal dyspnea, orthopnea or peripheral edema.  There have been no, lightheadedness or syncope.  Complains of 1 interval episode of atrial fibrillation less than about 48 hours.  Associated with fatigue but no other significant symptoms.   Date                PR  QRSd dose  6/15  94 o  2/16  104 100   3/16  116 150 bid decreased 2/2 QRSd   6/16  116 100  7/16  110 100  12/17`  108 100  7/18  92 100  3/19 180 114 100  9/19 164 102 100  10/20  170 102 100  12/21 188 98 (disagree with reading) 100  8/22 178 102 100     DATE TEST EF   2/15 TEE  50-55 %   1/16 Myoview  No perfusion defects  3//16 Echo   55-60 % `  3/19 CaScore  0   Lipid status begged risk stratification >>As above and no lipids indicated       Thromboembolic risk factors ( HTN-1) for a CHADSVASc Score of 1 >>  No anticoagulation   Date Cr K Hgb LDL  12/17  4.1  124  7/18  0.91 4.3  124  9/19  3.7    5/21 0.89 3.9 14.5 124    Past Medical History:  Diagnosis Date   Atrial fibrillation (HCC)    Cardiomyopathy    Resolved 05/03/09   Hyperlipidemia    Hypertension 03/03/96   Sleep apnea     Past Surgical History:  Procedure Laterality Date   CARDIOVERSION      Current Outpatient Medications  Medication Sig Dispense Refill   benazepril (LOTENSIN) 20 MG tablet TAKE 1 TABLET(20 MG) BY MOUTH TWICE DAILY 180 tablet 1   diltiazem (CARDIZEM CD) 120 MG 24 hr capsule  TAKE 1 CAPSULE BY MOUTH DAILY. 90 capsule 0   fexofenadine (ALLEGRA) 180 MG tablet Take 180 mg by mouth daily.     flecainide (TAMBOCOR) 100 MG tablet TAKE 1 TABLET BY MOUTH TWICE DAILY 180 tablet 0   fluticasone (FLONASE) 50 MCG/ACT nasal spray Place 2 sprays into both nostrils daily. Use for 4-6 weeks then stop and use seasonally or as needed. 48 g 3   hydrochlorothiazide (HYDRODIURIL) 25 MG tablet TAKE 1 TABLET(25 MG) BY MOUTH DAILY 90 tablet 3   ipratropium (ATROVENT) 0.06 % nasal spray USE 2 SPRAYS IN EACH NOSTRIL AT BEDTIME AS NEEDED FOR RHINITIS 15 mL 2   Multiple Vitamin (MULTIVITAMIN WITH MINERALS) TABS tablet Take 1 tablet by mouth daily.     Omega-3 Fatty Acids (FISH OIL) 1000 MG CAPS Take 1 capsule by mouth daily.      ranolazine (RANEXA) 500 MG 12 hr tablet Take 1 tablet (500 mg total) by mouth 2 (two) times daily. 60 tablet 0   No current facility-administered medications for this  visit.    Allergies  Allergen Reactions   Shellfish Allergy Itching    Review of Systems negative except from HPI and PMH  Physical Exam: Ht 6' (1.829 m)   BMI 38.65 kg/m  Well developed and Morbidly obese  in no acute distress HENT normal Neck supple with JVP-  flat   Lungs Clear Regular rate and rhythm, no murmurs or gallops Abd-soft with active BS No Clubbing cyanosis edema Skin-warm and dry A & Oriented  Grossly normal sensory and motor function  ECG: Sinus at 56 Intervals 18/10/45   Assessment and  Plan:  Atrial fibrillation persistent-- RVR on flecainide/ranolazine   Hypertension  Hyperlipidemia >> Ca score 0 3/19  Obesity   1 interval episode of atrial fibrillation.  CHA2DS2-VASc score does not inform the need for anticoagulation.  We will continue flecainide 100 twice daily and ranolazine 500 twice daily ECG-QRS without significant change, and indeed the QRS is back within normal and does not require down titration of his flecainide.  Blood pressures at home are well  controlled so we will continue him on his current doses hydrochlorothiazide 25, diltiazem 120, Lotensin 20.  Encourage weight loss.

## 2021-02-11 LAB — BASIC METABOLIC PANEL
BUN/Creatinine Ratio: 17 (ref 9–20)
BUN: 14 mg/dL (ref 6–24)
CO2: 23 mmol/L (ref 20–29)
Calcium: 9.4 mg/dL (ref 8.7–10.2)
Chloride: 104 mmol/L (ref 96–106)
Creatinine, Ser: 0.84 mg/dL (ref 0.76–1.27)
Glucose: 102 mg/dL — ABNORMAL HIGH (ref 65–99)
Potassium: 4.1 mmol/L (ref 3.5–5.2)
Sodium: 142 mmol/L (ref 134–144)
eGFR: 100 mL/min/{1.73_m2} (ref >59)

## 2021-09-01 DIAGNOSIS — R221 Localized swelling, mass and lump, neck: Secondary | ICD-10-CM | POA: Diagnosis not present

## 2021-09-01 DIAGNOSIS — R1314 Dysphagia, pharyngoesophageal phase: Secondary | ICD-10-CM | POA: Diagnosis not present

## 2021-09-01 DIAGNOSIS — G4733 Obstructive sleep apnea (adult) (pediatric): Secondary | ICD-10-CM | POA: Diagnosis not present

## 2021-09-01 DIAGNOSIS — R059 Cough, unspecified: Secondary | ICD-10-CM | POA: Diagnosis not present

## 2021-09-01 DIAGNOSIS — K219 Gastro-esophageal reflux disease without esophagitis: Secondary | ICD-10-CM | POA: Diagnosis not present

## 2021-09-02 ENCOUNTER — Other Ambulatory Visit: Payer: Self-pay | Admitting: Otolaryngology

## 2021-09-02 ENCOUNTER — Telehealth: Payer: Self-pay | Admitting: Internal Medicine

## 2021-09-02 DIAGNOSIS — I10 Essential (primary) hypertension: Secondary | ICD-10-CM

## 2021-09-02 DIAGNOSIS — Z79899 Other long term (current) drug therapy: Secondary | ICD-10-CM

## 2021-09-02 DIAGNOSIS — R1310 Dysphagia, unspecified: Secondary | ICD-10-CM

## 2021-09-02 DIAGNOSIS — R0989 Other specified symptoms and signs involving the circulatory and respiratory systems: Secondary | ICD-10-CM

## 2021-09-02 DIAGNOSIS — M542 Cervicalgia: Secondary | ICD-10-CM

## 2021-09-02 NOTE — Telephone Encounter (Signed)
Received fax from [facility] ?Requesting we switch patients medication from Benazepril to valsartan or losartan due to cough and globus sensation  ?States to contact patient and fax back response to [phone] ?Placed fax in nurse box for review ? ?

## 2021-09-05 NOTE — Telephone Encounter (Signed)
Fax from [facility] received from nurse bin. ? ?Will review with Dr. Caryl Comes upon his return to clinic on 3/7. ? ? ?

## 2021-09-07 ENCOUNTER — Ambulatory Visit
Admission: RE | Admit: 2021-09-07 | Discharge: 2021-09-07 | Disposition: A | Discharge status: Home / Self Care | Payer: BC Managed Care – PPO | Source: Ambulatory Visit | Attending: Otolaryngology | Admitting: Otolaryngology

## 2021-09-07 ENCOUNTER — Other Ambulatory Visit: Payer: Self-pay

## 2021-09-07 DIAGNOSIS — R1310 Dysphagia, unspecified: Secondary | ICD-10-CM | POA: Diagnosis not present

## 2021-09-07 DIAGNOSIS — R0989 Other specified symptoms and signs involving the circulatory and respiratory systems: Secondary | ICD-10-CM | POA: Insufficient documentation

## 2021-09-07 NOTE — Addendum Note (Signed)
Encounter addended by: Arlana Lindau, CCC-SLP on: 09/07/2021 3:37 PM ? Actions taken: Order list changed

## 2021-09-07 NOTE — Therapy (Signed)
[place], [place], [address] Phone: [phone]   Fax:     Modified Barium Swallow  Patient Details  Name: Jose Garcia MRN: [id number] Date of Birth: 01/07/1961 No data recorded  Encounter Date: 09/07/2021   End of Session - 09/07/21 1521     Visit Number 1    Number of Visits 1    Date for SLP Re-Evaluation 09/07/21    SLP Start Time 9326    SLP Stop Time  7124    SLP Time Calculation (min) 47 min    Activity Tolerance Patient tolerated treatment well             Past Medical History:  Diagnosis Date   Atrial fibrillation ([place])    Cardiomyopathy    Resolved 05/03/09   Hyperlipidemia    Hypertension 03/03/96   Sleep apnea     Past Surgical History:  Procedure Laterality Date   CARDIOVERSION      There were no vitals filed for this visit.   Subjective Assessment - 09/07/21 1436     Subjective "I have to cough and sometimes vomit things back up."    Currently in Pain? No/denies                   09/07/21 1400  SLP Visit Information  SLP Received On 09/07/21  Pain Assessment  Pain Assessment No/denies pain  General Information  Date of Onset  (pt reports onset with gradual worsening 2 years ago)  HPI Patient is a 61 y.o. male with past medical history including afib, HTN, HLD, and OSA referred by Dr. Pryor Ochoa for MBSS. Laryngoscopy on 09/01/21 showed reflux findings, LPR. Dr. Pryor Ochoa noted "erythema, edema, and cobblestoning interarytenoid pachydermia and laryngeal edema". Patient has has CT soft tissue neck pending due to posterior oropharyngeal swelling per MD. Prior imaging reviewed, with cervical x-ray in 2017 noting "solid hypertrophic anterior osteophytes extending from C5 to C7 without disc space narrowing. Additional prominent beaking osteophytes are seen anteriorly at the C4-5 level. Bony proliferative changes are seen at the C1-2 anterior articulation." Patient  reports solids sticking in pharynx and occasional regurgitation.  Type of Study MBS-Modified Barium Swallow Study  Previous Swallow Assessment none on file  Diet Prior to this Study Regular;Thin liquids  Temperature Spikes Noted No  Respiratory Status Room air  History of Recent Intubation No  Behavior/Cognition Alert;Cooperative;Pleasant mood  Oral Cavity Assessment WFL  Oral Care Completed by SLP No  Oral Cavity - Dentition Adequate natural dentition  Vision Functional for self feeding  Self-Feeding Abilities Able to feed self  Patient Positioning Upright in chair  Baseline Vocal Quality Normal  Volitional Cough Strong  Volitional Swallow Able to elicit  Anatomy Other (Comment) (cervical osteophytes noted on imaging in 2017)  Pharyngeal Secretions Not observed secondary MBS  Oral Motor/Sensory Function  Overall Oral Motor/Sensory Function WFL  Oral Preparation/Oral Phase  Oral Phase WFL  Pharyngeal Phase  Pharyngeal Phase Impaired  Pharyngeal - Pudding  Pharyngeal- Pudding Teaspoon Pharyngeal residue - valleculae;Pharyngeal residue - posterior pharnyx  Pharyngeal Material does not enter airway  Pharyngeal - Nectar  Pharyngeal- Nectar Teaspoon WFL  Pharyngeal Material does not enter airway  Pharyngeal- Nectar Cup [facility]  Pharyngeal Material does not enter airway  Pharyngeal- Nectar Straw WFL  Pharyngeal Material does not enter airway  Pharyngeal - Thin  Pharyngeal- Thin Teaspoon WFL  Pharyngeal Material does not enter airway  Pharyngeal- Thin Cup [facility]  Pharyngeal Material does not enter airway  Pharyngeal - Solids  Pharyngeal- Puree Pharyngeal residue - valleculae;Pharyngeal residue - posterior pharnyx  Pharyngeal Material does not enter airway  Pharyngeal- Regular Pharyngeal residue - posterior pharnyx;Pharyngeal residue - valleculae  Pharyngeal Material does not enter airway  Cervical Esophageal Phase  Cervical Esophageal Phase Impaired  Cervical Esophageal Phase -  Nectar  Nectar Teaspoon Reduced cricopharyngeal relaxation  Nectar Cup Reduced cricopharyngeal relaxation;Esophageal backflow into cervical esophagus  Nectar Straw Reduced cricopharyngeal relaxation;Esophageal backflow into cervical esophagus  Cervical Esophageal Phase - Thin  Thin Teaspoon Reduced cricopharyngeal relaxation  Thin Cup Reduced cricopharyngeal relaxation  Thin Straw Reduced cricopharyngeal relaxation  Cervical Esophageal Phase - Solids  Puree Reduced cricopharyngeal relaxation;Esophageal backflow into cervical esophagus  Regular Reduced cricopharyngeal relaxation  Pill Reduced cricopharyngeal relaxation  Clinical Impression  Clinical Impression Pt presents with mild-moderate structural pharyngeal and pharyngoesophageal dysphagia. Oral stage of swallowing is within normal limits, with adequate bolus hold, mastication and bolus formation, and timely anterior to posterior transfer. Pharyngeal phase is impacted by prominent cervical osteophytes which protrude into the posterior pharyngeal space and appear to impede bolus flow/stripping, moreso with thicker consistencies/solids. Swallow initiation is timely at the level of the base of tongue, and epiglottic deflection/airway protection are complete, with no penetration or aspiration observed on this examination. As solid textures transit past narrowing beginning around C4, there is slowed, incomplete clearance with retention of a portion of the tail of the bolus above C4. This results in residue along the posterior pharyngeal wall and the valleculae with solids (pudding, applesauce, graham cracker). Patient senses and completes second swallow which reduces but does not fully clear residue, and he utilizes a pumping motion with his tongue to generate retrograde movement of residue to his oral cavity. Patient was also evaluated in anterior-posterior view which showed lateralization of residue to the left greater than right. The cervical  esophageal phase of swallowing is noted for reduced amplitude of pharyngoesophageal segment opening, which contributes to reduced pharyngeal clearance. Also noted intermittent backflow in the cervical esophagus. An esophageal sweep was performed in standing postion (AP view) and was noted for reflux, tertiary contractions of the esophagus per radiology NP. Attempted compensatory postures/maneuvers however these were not particularly effective due to pt's limited cervical mobility which prevented him from achieving a full chin tuck and a full head turn to left.  Most effective strategies appear to be multiple swallows, following solids with liquids, and expectorating remaining residue. After the examination, reviewed images and recommendations with patient, reinforcing attempting medications with puree, (check with MD/pharmacy about breaking/crushing larger pills), utilizing the above-named strategies, and reflux precautions (handout provided). Patient may benefit from GI consult for further assessment of esophageal dysphagia.  SLP Visit Diagnosis Dysphagia, pharyngeal phase (R13.13);Dysphagia, pharyngoesophageal phase (R13.14)  Impact on safety and function Mild aspiration risk  Swallow Evaluation Recommendations  Recommended Consults Consider GI evaluation  SLP Diet Recommendations Regular solids;Thin liquid  Liquid Administration via Cup  Medication Administration Whole meds with puree  Compensations Slow rate;Small sips/bites;Multiple dry swallows after each bite/sip;Follow solids with liquid  Postural Changes Seated upright at 90 degrees;Remain semi-upright after after feeds/meals (Comment)  Treatment Plan  Oral Care Recommendations Oral care BID  Treatment Recommendations No treatment recommended at this time  Follow Up Recommendations No SLP follow up  Individuals Consulted  Consulted and Agree with Results and Recommendations Patient  Report Sent to  Referring physician  Progression Toward  Goals  Progression toward goals Goals met, education completed, patient  discharged from SLP  SLP Time Calculation  SLP Start Time (ACUTE ONLY) 1308  SLP Stop Time (ACUTE ONLY) 1355  SLP Time Calculation (min) (ACUTE ONLY) 47 min  SLP Evaluations  $ SLP Speech Visit 1 Visit  SLP Evaluations  $Outpatient MBS Swallow 1 Procedure    Dysphagia, unspecified type - Plan: DG SWALLOW FUNC OP MEDICARE SPEECH PATH, DG SWALLOW FUNC OP MEDICARE SPEECH PATH  Globus pharyngeus - Plan: DG SWALLOW FUNC OP MEDICARE SPEECH PATH, DG SWALLOW FUNC OP MEDICARE SPEECH PATH        Problem List Patient Active Problem List   Diagnosis Date Noted   OSA on CPAP 10/08/2019   Post-nasal drainage 10/08/2019   Morbid obesity ([place]) 05/05/2015   ED (erectile dysfunction) 05/05/2015   Cardiomyopathy ([place]) 11/06/2014   EDEMA 01/07/2010   Paroxysmal atrial fibrillation ([place]) 01/22/2009   HYPERCHOLESTEROLEMIA 12/31/2007   Essential hypertension 12/31/2007   Deneise Lever, MS, CCC-SLP Speech-Language Pathologist [phone]   Aliene Altes, [place] 09/07/2021, 3:22 PM  [facility] [place], [place], [address] Phone: [phone]   Fax:     Name: Jose Garcia MRN: [id number] Date of Birth: 1960-12-29

## 2021-09-08 MED ORDER — SPIRONOLACTONE 25 MG PO TABS: 25.0000 mg | ORAL_TABLET | Freq: Every day | ORAL | 3 refills | Status: DC

## 2021-09-08 NOTE — Telephone Encounter (Signed)
Dr. Graciela Husbands reviewed the fax received from [facility]. ?Per Dr. Graciela Husbands, he would like to avoid ARB's as well (including valsartan/ losartan) due to potential for a cough with these drugs as well. ? ?Recommendations are to: ? ?1) STOP benazepril ? ?2) START spironolactone 25 mg: ?- take 1 tablet by mouth once daily for blood pressure ? ?3) BMP in 2 weeks ? ?I have called and notified the patient of the above MD recommendations. ?He voices understanding and is agreeable with this POC. ? ?He could not come for lab work the week of 3/20, but is scheduled for 09/28/21 at 9:00 am.  ? ? ?Response also faxed by to [facility] with the POC. ?Fax- [phone] ? ?

## 2021-09-09 ENCOUNTER — Encounter: Payer: Self-pay | Admitting: Internal Medicine

## 2021-09-09 ENCOUNTER — Telehealth: Payer: Self-pay

## 2021-09-09 NOTE — Telephone Encounter (Signed)
Scheduled for 12/20/2021 ?

## 2021-09-20 ENCOUNTER — Ambulatory Visit
Admission: RE | Admit: 2021-09-20 | Discharge: 2021-09-20 | Disposition: A | Discharge status: Home / Self Care | Payer: BC Managed Care – PPO | Source: Ambulatory Visit | Attending: Otolaryngology | Admitting: Otolaryngology

## 2021-09-20 DIAGNOSIS — R1310 Dysphagia, unspecified: Secondary | ICD-10-CM | POA: Diagnosis not present

## 2021-09-20 DIAGNOSIS — M542 Cervicalgia: Secondary | ICD-10-CM

## 2021-09-20 MED ORDER — IOPAMIDOL (ISOVUE-300) INJECTION 61%
75.0000 mL | Freq: Once | INTRAVENOUS | Status: AC | PRN
  Administered 2021-09-20: 75 mL via INTRAVENOUS

## 2021-09-28 ENCOUNTER — Other Ambulatory Visit (INDEPENDENT_AMBULATORY_CARE_PROVIDER_SITE_OTHER): Payer: BC Managed Care – PPO

## 2021-09-28 ENCOUNTER — Other Ambulatory Visit: Payer: Self-pay

## 2021-09-28 DIAGNOSIS — I10 Essential (primary) hypertension: Secondary | ICD-10-CM | POA: Diagnosis not present

## 2021-09-28 DIAGNOSIS — Z79899 Other long term (current) drug therapy: Secondary | ICD-10-CM

## 2021-09-29 LAB — BASIC METABOLIC PANEL
BUN/Creatinine Ratio: 17 (ref 10–24)
BUN: 19 mg/dL (ref 8–27)
CO2: 22 mmol/L (ref 20–29)
Calcium: 9.5 mg/dL (ref 8.6–10.2)
Chloride: 102 mmol/L (ref 96–106)
Creatinine, Ser: 1.09 mg/dL (ref 0.76–1.27)
Glucose: 119 mg/dL — ABNORMAL HIGH (ref 70–99)
Potassium: 4.3 mmol/L (ref 3.5–5.2)
Sodium: 142 mmol/L (ref 134–144)
eGFR: 78 mL/min/{1.73_m2} (ref >59)

## 2021-10-06 MED ORDER — FLECAINIDE ACETATE 100 MG PO TABS: 100.0000 mg | ORAL_TABLET | Freq: Two times a day (BID) | ORAL | 1 refills | Status: DC

## 2021-11-02 ENCOUNTER — Other Ambulatory Visit: Payer: Self-pay | Admitting: Internal Medicine

## 2021-11-02 MED ORDER — RANOLAZINE ER 500 MG PO TB12
500.0000 mg | ORAL_TABLET | Freq: Two times a day (BID) | ORAL | 0 refills | Status: DC
Start: 2021-11-02 — End: 2022-02-28

## 2021-11-09 ENCOUNTER — Ambulatory Visit (INDEPENDENT_AMBULATORY_CARE_PROVIDER_SITE_OTHER): Payer: BC Managed Care – PPO | Admitting: Family Medicine

## 2021-11-09 ENCOUNTER — Encounter: Payer: Self-pay | Admitting: Family Medicine

## 2021-11-09 VITALS — BP 144/80 | HR 53 | Ht 72.0 in | Wt 283.8 lb

## 2021-11-09 DIAGNOSIS — I480 Paroxysmal atrial fibrillation: Secondary | ICD-10-CM

## 2021-11-09 DIAGNOSIS — E7800 Pure hypercholesterolemia, unspecified: Secondary | ICD-10-CM | POA: Diagnosis not present

## 2021-11-09 DIAGNOSIS — I10 Essential (primary) hypertension: Secondary | ICD-10-CM

## 2021-11-09 DIAGNOSIS — R7309 Other abnormal glucose: Secondary | ICD-10-CM

## 2021-11-09 DIAGNOSIS — G4733 Obstructive sleep apnea (adult) (pediatric): Secondary | ICD-10-CM | POA: Diagnosis not present

## 2021-11-09 DIAGNOSIS — Z0000 Encounter for general adult medical examination without abnormal findings: Secondary | ICD-10-CM | POA: Diagnosis not present

## 2021-11-09 DIAGNOSIS — Z125 Encounter for screening for malignant neoplasm of prostate: Secondary | ICD-10-CM | POA: Diagnosis not present

## 2021-11-09 DIAGNOSIS — Z9989 Dependence on other enabling machines and devices: Secondary | ICD-10-CM

## 2021-11-09 NOTE — Assessment & Plan Note (Signed)
Mostly controlled cholesterol on lifestyle. Not on statin ?Previously ? ?The 10-year ASCVD risk score (Arnett DK, et al., 2019) is: 12.2% ? ?Plan: ?1. Discussed ASCVD future risk calculation, offered statin, will defer for now. ?2. Encourage improved lifestyle - low carb/cholesterol, reduce portion size, continue improving regular exercise ?Follow-up yearly ? ?Check lab ? ?

## 2021-11-09 NOTE — Patient Instructions (Addendum)
Thank you for coming to the office today. ? ?Check Shingles vaccine Shingrix 2 doses 2-6 months apart, here or at pharmacy if interested. ? ?No change to meds today, lets see what happens with GI and updates from Dr Graciela Husbands first. ? ?Future would suggest reconsider the newer versions of Benazepril, review once more with Dr Graciela Husbands in future, such as Valsartan, Losartan etc. He has already suggested that these may be similar to Benazepril so not a big deal now. ? ? ?Please schedule a Follow-up Appointment to: Return in about 1 year (around 11/10/2022) for 1 year Annual Physical AM apt fasting lab AFTER. ? ?If you have any other questions or concerns, please feel free to call the office or send a message through MyChart. You may also schedule an earlier appointment if necessary. ? ?Additionally, you may be receiving a survey about your experience at our office within a few days to 1 week by e-mail or mail. We value your feedback. ? ?Saralyn Pilar, DO ?[facility], [place] ?

## 2021-11-09 NOTE — Progress Notes (Signed)
? ?Subjective:  ? ? Patient ID: Jose Garcia, male    DOB: [birth year], 61 y.o.   MRN: [id number] ? ?Jose Garcia is a 61 y.o. male presenting on 11/09/2021 for Annual Exam ? ? ?HPI ? ?Here for Annual Physical and Lab Review. ?  ?Paroxysmal Atrial Fibrillation ?[facility] ?- Currently reports that his AFib is well controlled. Currently not experiencing any arrhythmia ?- Meds: Flecainide 100mg  BID, Diltiazem 24 hr 120mg  daily, Ranolazine (Ranexa) 500mg  BID ?No significant known CAD. He had prior CT Cardiac score 0 negative 08/2017 ?He is doing well currently without symptoms or problem. ?  ?CHRONIC HTN: ?Reports home BP controlled, usually has higher reading at doctors office. ?No meds today ?Side effect of effect with Benazepril, he has issue with this throat inflammation then thought due to esophagus ?Trial on Cleda DaubSpiro then came off ?Current Meds - Diltiazem, Benazepril 20mg  BID, HCTZ 25mg  daily - see meds above for AFib as well.   ?Reports good compliance, took meds today. Tolerating well, w/o complaints. ?Lifestyle: ?- Diet: Weight watchers, diet, limiting portions, calories ?- Exercise: Not regular. Previously more treadmill exercise - causes him some hip pain ?Denies CP, dyspnea, HA, edema, dizziness / lightheadedness ?  ?HYPERLIPIDEMIA: ?- Reports no concerns. Last lipid panel 2021, mostly controlled, slightly elevated LDL ?Not on statin therapy ?On Omega 3 ?  ?OSA, on CPAP ?- Patient reports prior history of dx OSA and on CPAP for >4 years, prior to treatment initial symptoms were snoring, daytime sleepiness and fatigue, has had sleep study PSG in 09/2015 scanned into chart ?- Today reports that sleep apnea is well controlled. Uses the CPAP machine every night. Tolerates the machine well, and thinks that sleeps better with it and feels good. No new concerns or symptoms. ?  ?   ?Health Maintenance: ?Hep C screening negative ?  ?Colon CA Screening: Last colonoscopy reported to be  about 2013, was told "clean" and "negative" unsure call back, but thought to be about 10 years. Last Cologuard negative 01/28/20. Due in 01/2023 ?  ?Prostate CA Screening: Prior PSA 0.6 (2016). Last PSA 0.6 (11/12/19). Currently asymptomatic.  No known family history of prostate CA.  ? ?Shingles vaccine due in future. ? ? ? ?  11/09/2021  ?  8:31 AM 11/19/2019  ?  9:27 AM 10/08/2019  ? 10:11 AM  ?Depression screen PHQ 2/9  ?Decreased Interest 0 0 0  ?Down, Depressed, Hopeless 0 0 0  ?PHQ - 2 Score 0 0 0  ?Altered sleeping 0    ?Tired, decreased energy 0    ?Change in appetite 0    ?Feeling bad or failure about yourself  0    ?Trouble concentrating 0    ?Moving slowly or fidgety/restless 0    ?Suicidal thoughts 0    ?PHQ-9 Score 0    ?Difficult doing work/chores Not difficult at all    ? ? ?Past Medical History:  ?Diagnosis Date  ? Atrial fibrillation (HCC)   ? Cardiomyopathy   ? Resolved 05/03/09  ? Hyperlipidemia   ? Hypertension 03/03/96  ? Sleep apnea   ? ?Past Surgical History:  ?Procedure Laterality Date  ? CARDIOVERSION    ? ?Social History  ? ?Socioeconomic History  ? Marital status: Married  ?  Spouse name: Not on file  ? Number of children: 2  ? Years of education: Not on file  ? Highest education level: Not on file  ?Occupational History  ? 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 Smoking status: Never  ? Smokeless tobacco: Never  ?Substance and Sexual Activity  ? Alcohol use: Yes  ?  Alcohol/week: 0.0 standard drinks  ?  Comment: Rare  ? Drug use: No  ? Sexual activity: Not on file  ?Other Topics Concern  ? Not on file  ?Social History Narrative  ? Travels a lot.  ? ?Social Determinants of Health  ? ?Financial Resource Strain: Not on file  ?Food Insecurity: Not on file  ?Transportation Needs: Not on file  ?Physical Activity: Not on file  ?Stress: Not on file  ?Social Connections: Not on file  ?Intimate Partner Violence: Not on file  ? ?Family History  ?Problem Relation Age of Onset  ?  Hypertension Father   ? Hypertension Mother   ? Heart disease Mother   ? Cancer Mother   ? Diabetes Neg Hx   ? Coronary artery disease Neg Hx   ? Colon cancer Neg Hx   ? Prostate cancer Neg Hx   ? ?Current Outpatient Medications on File Prior to Visit  ?Medication Sig  ? benazepril (LOTENSIN) 20 MG tablet Take 20 mg by mouth in the morning and at bedtime.  ? diltiazem (CARDIZEM CD) 120 MG 24 hr capsule TAKE 1 CAPSULE BY MOUTH DAILY.  ? flecainide (TAMBOCOR) 100 MG tablet Take 1 tablet (100 mg total) by mouth 2 (two) times daily.  ? fluticasone (FLONASE) 50 MCG/ACT nasal spray Place 2 sprays into both nostrils daily. Use for 4-6 weeks then stop and use seasonally or as needed.  ? hydrochlorothiazide (HYDRODIURIL) 25 MG tablet TAKE 1 TABLET(25 MG) BY MOUTH DAILY  ? loratadine (CLARITIN) 10 MG tablet Take 10 mg by mouth daily as needed for allergies.  ? ranolazine (RANEXA) 500 MG 12 hr tablet Take 1 tablet (500 mg total) by mouth 2 (two) times daily. PLEASE SCHEDULE OFFICE VISIT FOR FURTHER REFILLS. THANK YOU!  ? ipratropium (ATROVENT) 0.06 % nasal spray USE 2 SPRAYS IN EACH NOSTRIL AT BEDTIME AS NEEDED FOR RHINITIS (Patient not taking: Reported on 02/10/2021)  ? ?No current facility-administered medications on file prior to visit.  ? ? ?Review of Systems  ?Constitutional:  Negative for activity change, appetite change, chills, diaphoresis, fatigue and fever.  ?HENT:  Negative for congestion and hearing loss.   ?Eyes:  Negative for visual disturbance.  ?Respiratory:  Negative for cough, chest tightness, shortness of breath and wheezing.   ?Cardiovascular:  Negative for chest pain, palpitations and leg swelling.  ?Gastrointestinal:  Negative for abdominal pain, constipation, diarrhea, nausea and vomiting.  ?Genitourinary:  Negative for dysuria, frequency and hematuria.  ?Musculoskeletal:  Negative for arthralgias and neck pain.  ?Skin:  Negative for rash.  ?Neurological:  Negative for dizziness, weakness,  light-headedness, numbness and headaches.  ?Hematological:  Negative for adenopathy.  ?Psychiatric/Behavioral:  Negative for behavioral problems, dysphoric mood and sleep disturbance.   ?Per HPI unless specifically indicated above ? ? ?   ?Objective:  ?  ?BP (!) 144/80 (BP Location: Left Arm, Cuff Size: Normal)   Pulse (!) 53   Ht 6' (1.829 m)   Wt 283 lb 12.8 oz (128.7 kg)   SpO2 99%   BMI 38.49 kg/m?   ?Wt Readings from Last 3 Encounters:  ?11/09/21 283 lb 12.8 oz (128.7 kg)  ?02/10/21 280 lb (127 kg)  ?06/24/20 285 lb (129.3 kg)  ?  ?Physical Exam ?Vitals and nursing note reviewed.  ?Constitutional:   ?  General: He is not in acute distress. ?   Appearance: He is well-developed. He is not diaphoretic.  ?   Comments: Well-appearing, comfortable, cooperative  ?HENT:  ?   Head: Normocephalic and atraumatic.  ?Eyes:  ?   General:     ?   Right eye: No discharge.     ?   Left eye: No discharge.  ?   Conjunctiva/sclera: Conjunctivae normal.  ?   Pupils: Pupils are equal, round, and reactive to light.  ?Neck:  ?   Thyroid: No thyromegaly.  ?   Vascular: No carotid bruit.  ?Cardiovascular:  ?   Rate and Rhythm: Normal rate and regular rhythm.  ?   Pulses: Normal pulses.  ?   Heart sounds: Normal heart sounds. No murmur heard. ?Pulmonary:  ?   Effort: Pulmonary effort is normal. No respiratory distress.  ?   Breath sounds: Normal breath sounds. No wheezing or rales.  ?Abdominal:  ?   General: Bowel sounds are normal. There is no distension.  ?   Palpations: Abdomen is soft. There is no mass.  ?   Tenderness: There is no abdominal tenderness.  ?Musculoskeletal:     ?   General: No tenderness. Normal range of motion.  ?   Cervical back: Normal range of motion and neck supple.  ?   Right lower leg: Edema present.  ?   Left lower leg: Edema present.  ?   Comments: Upper / Lower Extremities: ?- Normal muscle tone, strength bilateral upper extremities 5/5, lower extremities 5/5  ?Lymphadenopathy:  ?   Cervical: No  cervical adenopathy.  ?Skin: ?   General: Skin is warm and dry.  ?   Findings: No erythema or rash.  ?Neurological:  ?   Mental Status: He is alert and oriented to person, place, and time.  ?   Comments: Distal sensation intact

## 2021-11-09 NOTE — Assessment & Plan Note (Signed)
Well controlled, chronic OSA on CPAP - Good adherence to CPAP nightly - Continue current CPAP therapy, patient seems to be benefiting from therapy  

## 2021-11-09 NOTE — Assessment & Plan Note (Signed)
Stable without any recent flare up or episode Followed by [facility] Managed on rhythm and rate control, w Flecainide and Diltiazem, and ranexa for symptoms Not on anticoagulation at this time. 

## 2021-11-09 NOTE — Assessment & Plan Note (Signed)
Mildly elevated initial BP, repeat manual check improved but similar. ?- Home BP readings normal  ?Elevated in office ?No complication ?Did not take med today ?  ?Plan:  ?1. Continue current BP regimen - discussed that cardiology has resumed benazepril , question if this was contributing to throat symptoms, seems less likely given same issue off med. Now OFF Spironolactone ?2. Encourage improved lifestyle - low sodium diet, regular exercise ?3. Continue monitor BP outside office, bring readings to next visit, if persistently >140/90 or new symptoms notify office sooner ?4. Follow-up after cardiology in future can adjust further if need ? ?

## 2021-11-10 LAB — CBC WITH DIFFERENTIAL/PLATELET
Absolute Monocytes: 505 cells/uL (ref 200–950)
Basophils Absolute: 0 cells/uL (ref 0–200)
Basophils Relative: 0 %
Eosinophils Absolute: 60 cells/uL (ref 15–500)
Eosinophils Relative: 1.2 %
HCT: 42.4 % (ref 38.5–50.0)
Hemoglobin: 14.6 g/dL (ref 13.2–17.1)
Lymphs Abs: 825 cells/uL — ABNORMAL LOW (ref 850–3900)
MCH: 33 pg (ref 27.0–33.0)
MCHC: 34.4 g/dL (ref 32.0–36.0)
MCV: 95.7 fL (ref 80.0–100.0)
MPV: 12.1 fL (ref 7.5–12.5)
Monocytes Relative: 10.1 %
Neutro Abs: 3610 cells/uL (ref 1500–7800)
Neutrophils Relative %: 72.2 %
Platelets: 152 10*3/uL (ref 140–400)
RBC: 4.43 10*6/uL (ref 4.20–5.80)
RDW: 11.7 % (ref 11.0–15.0)
Total Lymphocyte: 16.5 %
WBC: 5 10*3/uL (ref 3.8–10.8)

## 2021-11-10 LAB — COMPLETE METABOLIC PANEL WITH GFR
AG Ratio: 1.8 (calc) (ref 1.0–2.5)
ALT: 21 U/L (ref 9–46)
AST: 16 U/L (ref 10–35)
Albumin: 4.2 g/dL (ref 3.6–5.1)
Alkaline phosphatase (APISO): 47 U/L (ref 35–144)
BUN: 15 mg/dL (ref 7–25)
CO2: 28 mmol/L (ref 20–32)
Calcium: 9.5 mg/dL (ref 8.6–10.3)
Chloride: 104 mmol/L (ref 98–110)
Creat: 0.92 mg/dL (ref 0.70–1.35)
Globulin: 2.4 g/dL (calc) (ref 1.9–3.7)
Glucose, Bld: 109 mg/dL — ABNORMAL HIGH (ref 65–99)
Potassium: 4.1 mmol/L (ref 3.5–5.3)
Sodium: 141 mmol/L (ref 135–146)
Total Bilirubin: 0.7 mg/dL (ref 0.2–1.2)
Total Protein: 6.6 g/dL (ref 6.1–8.1)
eGFR: 95 mL/min/{1.73_m2} (ref >=60)

## 2021-11-10 LAB — HEMOGLOBIN A1C
Hgb A1c MFr Bld: 5 % of total Hgb (ref <5.7)
Mean Plasma Glucose: 97 mg/dL
eAG (mmol/L): 5.4 mmol/L

## 2021-11-10 LAB — LIPID PANEL
Cholesterol: 191 mg/dL (ref <200)
HDL: 53 mg/dL (ref >=40)
LDL Cholesterol (Calc): 122 mg/dL (calc) — ABNORMAL HIGH
Non-HDL Cholesterol (Calc): 138 mg/dL (calc) — ABNORMAL HIGH (ref <130)
Total CHOL/HDL Ratio: 3.6 (calc) (ref <5.0)
Triglycerides: 68 mg/dL (ref <150)

## 2021-11-10 LAB — PSA: PSA: 0.63 ng/mL (ref <=4.00)

## 2021-11-10 LAB — TSH: TSH: 1.85 mIU/L (ref 0.40–4.50)

## 2021-11-14 ENCOUNTER — Other Ambulatory Visit: Payer: Self-pay

## 2021-11-14 MED ORDER — BENAZEPRIL HCL 20 MG PO TABS: 20.0000 mg | ORAL_TABLET | Freq: Two times a day (BID) | ORAL | 0 refills | Status: DC

## 2021-12-04 ENCOUNTER — Encounter: Payer: Self-pay | Admitting: Internal Medicine

## 2021-12-05 ENCOUNTER — Other Ambulatory Visit: Payer: Self-pay | Admitting: *Deleted

## 2021-12-05 MED ORDER — HYDROCHLOROTHIAZIDE 25 MG PO TABS: ORAL_TABLET | ORAL | 2 refills | Status: DC

## 2021-12-20 ENCOUNTER — Ambulatory Visit (INDEPENDENT_AMBULATORY_CARE_PROVIDER_SITE_OTHER): Payer: BC Managed Care – PPO | Admitting: Gastroenterology

## 2021-12-20 ENCOUNTER — Encounter: Payer: Self-pay | Admitting: Gastroenterology

## 2021-12-20 VITALS — BP 155/76 | HR 73 | Temp 98.6°F | Ht 72.0 in | Wt 280.0 lb

## 2021-12-20 DIAGNOSIS — R1313 Dysphagia, pharyngeal phase: Secondary | ICD-10-CM | POA: Diagnosis not present

## 2021-12-20 NOTE — Progress Notes (Signed)
Gastroenterology Consultation  Referring Provider:     Bud Face, MD Primary Care Physician:  Smitty Cords, DO Primary Gastroenterologist:  Dr. Servando Snare     Reason for Consultation:     Dysphagia        HPI:   Jose Garcia is a 61 y.o. y/o male referred for consultation & management of dysphagia by Dr. Althea Charon, Netta Neat, DO.  This patient comes in today after being evaluated by ENT for dysphagia.  The patient had a modified barium swallow with the findings below.  The main findings were prominent cervical osteophytes which protrude into the posterior pharyngeal space and appears to impede bolus flow/stripping.  There is also reported that attempted compensatory postures/maneuvers were not particularly effective.  It was also recommended that the patient may benefit from a GI consult for further assessment of dysphagia.  The patient also appears to have had his last colonoscopy in 2013 and has been doing Cologuard with his next Cologuard due in 2024.  "Pt presents with mild-moderate structural pharyngeal and pharyngoesophageal dysphagia. Oral stage of swallowing is within normal limits, with adequate bolus hold, mastication and bolus formation, and timely anterior to posterior transfer. Pharyngeal phase is impacted by prominent cervical osteophytes which protrude into the posterior pharyngeal space and appear to impede bolus flow/stripping, moreso with thicker consistencies/solids. Swallow initiation is timely at the level of the base of tongue, and epiglottic deflection/airway protection are complete, with no penetration or aspiration observed on this examination. As solid textures transit past narrowing beginning around C4, there is slowed, incomplete clearance with retention of a portion of the tail of the bolus above C4. This results in residue along the posterior pharyngeal wall and the valleculae with solids (pudding, applesauce, graham cracker). Patient senses and  completes second swallow which reduces but does not fully clear residue, and he utilizes a pumping motion with his tongue to generate retrograde movement of residue to his oral cavity. Patient was also evaluated in anterior-posterior view which showed lateralization of residue to the left greater than right. The cervical esophageal phase of swallowing is noted for reduced amplitude of pharyngoesophageal segment opening, which contributes to reduced pharyngeal clearance. Also noted intermittent backflow in the cervical esophagus. An esophageal sweep was performed in standing postion (AP view) and was noted for reflux, tertiary contractions of the esophagus per radiology NP. Attempted compensatory postures/maneuvers however these were not particularly effective due to pt's limited cervical mobility which prevented him from achieving a full chin tuck and a full head turn to left.  Most effective strategies appear to be multiple swallows, following solids with liquids, and expectorating remaining residue. After the examination, reviewed images and recommendations with patient, reinforcing attempting medications with puree, (check with MD/pharmacy about breaking/crushing larger pills), utilizing the above-named strategies, and reflux precautions (handout provided). Patient may benefit from GI consult for further assessment of esophageal dysphagia."  The patient reports that he is not having any problem once the food goes past his throat and into his esophagus.  He states that his biggest problem is with pills.  There is no report of any unexplained weight loss fevers chills nausea vomiting black stools or bloody stools.  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  The patient states that he was told that time that they would address it if he ever started  to have tingling of his  hands.  Past Medical History:  Diagnosis Date   Atrial fibrillation ([place])    Cardiomyopathy    Resolved 05/03/09   Hyperlipidemia    Hypertension 03/03/96   Sleep apnea     Past Surgical History:  Procedure Laterality Date   CARDIOVERSION      Prior to Admission medications   Medication Sig Start Date End Date Taking? Authorizing Provider  benazepril (LOTENSIN) 20 MG tablet Take 1 tablet (20 mg total) by mouth 2 (two) times daily. 11/14/21   Deboraha Sprang, MD  diltiazem (CARDIZEM CD) 120 MG 24 hr capsule TAKE 1 CAPSULE BY MOUTH DAILY. 02/10/21   Deboraha Sprang, MD  flecainide (TAMBOCOR) 100 MG tablet Take 1 tablet (100 mg total) by mouth 2 (two) times daily. 10/06/21   Deboraha Sprang, MD  fluticasone (FLONASE) 50 MCG/ACT nasal spray Place 2 sprays into both nostrils daily. Use for 4-6 weeks then stop and use seasonally or as needed. 10/08/19   Karamalegos, Devonne Doughty, DO  hydrochlorothiazide (HYDRODIURIL) 25 MG tablet TAKE 1 TABLET(25 MG) BY MOUTH DAILY 12/05/21   Deboraha Sprang, MD  ipratropium (ATROVENT) 0.06 % nasal spray USE 2 SPRAYS IN EACH NOSTRIL AT BEDTIME AS NEEDED FOR RHINITIS Patient not taking: Reported on 02/10/2021 02/06/20   Olin Hauser, DO  loratadine (CLARITIN) 10 MG tablet Take 10 mg by mouth daily as needed for allergies.    [provider]  ranolazine (RANEXA) 500 MG 12 hr tablet Take 1 tablet (500 mg total) by mouth 2 (two) times daily. PLEASE SCHEDULE OFFICE VISIT FOR FURTHER REFILLS. THANK YOU! 11/02/21 01/31/22  Deboraha Sprang, MD    Family History  Problem Relation Age of Onset   Hypertension Father    Hypertension Mother    Heart disease Mother    Cancer Mother    Diabetes Neg Hx    Coronary artery disease Neg Hx    Colon cancer Neg Hx    Prostate cancer Neg Hx      Social History   Tobacco Use   Smoking status: Never   Smokeless tobacco: Never  Substance Use Topics   Alcohol use: Yes    Alcohol/week: 0.0 standard drinks of alcohol     Comment: Rare   Drug use: No    Allergies as of 12/20/2021 - Review Complete 12/20/2021  Allergen Reaction Noted   Shellfish allergy Itching 09/02/2013    Review of Systems:    All systems reviewed and negative except where noted in HPI.   Physical Exam:  BP (!) 155/76   Pulse 73   Temp 98.6 F (37 C) (Oral)   Ht 6' (1.829 m)   Wt 280 lb (127 kg)   BMI 37.97 kg/m  No LMP for male patient. General:   Alert,  Well-developed, well-nourished, pleasant and cooperative in NAD Head:  Normocephalic and atraumatic. Eyes:  Sclera clear, no icterus.   Conjunctiva pink. Ears:  Normal auditory acuity. Neurologic:  Alert and oriented x3;  grossly normal neurologically. Psych:  Alert and cooperative. Normal mood and affect.  Imaging Studies: No results found.  Assessment and Plan:   Jose Garcia is a 61 y.o. y/o male who comes in with dysphagia with a pharyngeal osteophyte at the level of C4.  The patient's symptoms are not consistent with esophageal dysmotility since once it is past the throat all of his foods and pills have no trouble passing.  It also does not  appear that the patient is having any esophageal dysphagia since he states that the meat and bulky foods that he eats once they get past his throat go down easily.  He is only having problems with pills coming out of the throat and he sometimes has to spit them out because they will go down.  The patient has been told that his options are to do nothing, EGD or barium swallow.  I have explained to him that an EGD is unlikely to be informative and his symptoms do sound to be attributed to his osteophyte.  The patient has been encouraged to contact his orthopedic surgeon and see what the plan would be for this osteophyte.  If they require him to have some esophageal investigation then a barium swallow versus an EGD can be attempted at that time.  The patient and his wife have been explained the plan and agree with it.    Midge Minium, MD. [facility]    Note: This dictation was prepared with Dragon dictation along with smaller phrase technology. Any transcriptional errors that result from this process are unintentional.

## 2022-01-09 ENCOUNTER — Other Ambulatory Visit: Payer: Self-pay

## 2022-01-09 MED ORDER — DILTIAZEM HCL ER COATED BEADS 120 MG PO CP24: ORAL_CAPSULE | ORAL | 0 refills | Status: DC

## 2022-01-11 DIAGNOSIS — M4802 Spinal stenosis, cervical region: Secondary | ICD-10-CM | POA: Diagnosis not present

## 2022-01-11 DIAGNOSIS — M47812 Spondylosis without myelopathy or radiculopathy, cervical region: Secondary | ICD-10-CM | POA: Diagnosis not present

## 2022-01-20 DIAGNOSIS — M47812 Spondylosis without myelopathy or radiculopathy, cervical region: Secondary | ICD-10-CM | POA: Diagnosis not present

## 2022-01-20 DIAGNOSIS — M4802 Spinal stenosis, cervical region: Secondary | ICD-10-CM | POA: Diagnosis not present

## 2022-01-20 DIAGNOSIS — Z981 Arthrodesis status: Secondary | ICD-10-CM | POA: Diagnosis not present

## 2022-02-09 ENCOUNTER — Ambulatory Visit: Payer: BC Managed Care – PPO | Admitting: Internal Medicine

## 2022-02-20 ENCOUNTER — Other Ambulatory Visit: Payer: Self-pay

## 2022-02-20 MED ORDER — BENAZEPRIL HCL 20 MG PO TABS: 20.0000 mg | ORAL_TABLET | Freq: Two times a day (BID) | ORAL | 0 refills | Status: DC

## 2022-02-28 ENCOUNTER — Telehealth: Payer: Self-pay | Admitting: Internal Medicine

## 2022-02-28 MED ORDER — BENAZEPRIL HCL 20 MG PO TABS
20.0000 mg | ORAL_TABLET | Freq: Two times a day (BID) | ORAL | 0 refills | Status: DC
Start: 2022-02-28 — End: 2022-05-04

## 2022-02-28 MED ORDER — RANOLAZINE ER 500 MG PO TB12
500.0000 mg | ORAL_TABLET | Freq: Two times a day (BID) | ORAL | 0 refills | Status: DC
Start: 2022-02-28 — End: 2022-05-04

## 2022-02-28 NOTE — Telephone Encounter (Signed)
*  STAT* If patient is at the pharmacy, call can be transferred to refill team.   1. Which medications need to be refilled? (please list name of each medication and dose if known) Hydrochlorothiazide , Ranolazine and Benazepril  2. Which pharmacy/location (including street and city if local pharmacy) is medication to be sent to? [facility] [place]  3. Do they need a 30 day or 90 day supply? 90 days and refills

## 2022-04-04 ENCOUNTER — Telehealth: Payer: Self-pay | Admitting: Internal Medicine

## 2022-04-04 MED ORDER — FLECAINIDE ACETATE 100 MG PO TABS: 100.0000 mg | ORAL_TABLET | Freq: Two times a day (BID) | ORAL | 0 refills | Status: DC

## 2022-04-04 NOTE — Telephone Encounter (Signed)
*  STAT* If patient is at the pharmacy, call can be transferred to refill team.   1. Which medications need to be refilled? (please list name of each medication and dose if known)   flecainide (TAMBOCOR) 100 MG tablet    2. Which pharmacy/location (including street and city if local pharmacy) is medication to be sent to? [facility] [place], [address] [facility] [address]  3. Do they need a 30 day or 90 day supply?  90 day

## 2022-04-10 ENCOUNTER — Other Ambulatory Visit: Payer: Self-pay

## 2022-04-10 MED ORDER — DILTIAZEM HCL ER COATED BEADS 120 MG PO CP24: ORAL_CAPSULE | ORAL | 0 refills | Status: DC

## 2022-05-04 ENCOUNTER — Ambulatory Visit: Payer: BC Managed Care – PPO | Attending: Internal Medicine | Admitting: Internal Medicine

## 2022-05-04 ENCOUNTER — Encounter: Payer: Self-pay | Admitting: Internal Medicine

## 2022-05-04 VITALS — BP 132/70 | HR 55 | Ht 72.0 in | Wt 285.5 lb

## 2022-05-04 DIAGNOSIS — I480 Paroxysmal atrial fibrillation: Secondary | ICD-10-CM

## 2022-05-04 DIAGNOSIS — I10 Essential (primary) hypertension: Secondary | ICD-10-CM

## 2022-05-04 DIAGNOSIS — Z79899 Other long term (current) drug therapy: Secondary | ICD-10-CM | POA: Diagnosis not present

## 2022-05-04 MED ORDER — FLECAINIDE ACETATE 100 MG PO TABS: 100.0000 mg | ORAL_TABLET | Freq: Two times a day (BID) | ORAL | 3 refills | Status: DC

## 2022-05-04 MED ORDER — LOSARTAN POTASSIUM 100 MG PO TABS: 100.0000 mg | ORAL_TABLET | Freq: Every day | ORAL | 3 refills | Status: DC

## 2022-05-04 MED ORDER — DILTIAZEM HCL ER COATED BEADS 120 MG PO CP24: ORAL_CAPSULE | ORAL | 3 refills | Status: DC

## 2022-05-04 MED ORDER — HYDROCHLOROTHIAZIDE 25 MG PO TABS: ORAL_TABLET | ORAL | 3 refills | Status: DC

## 2022-05-04 MED ORDER — FUROSEMIDE 20 MG PO TABS: ORAL_TABLET | ORAL | 1 refills | Status: DC

## 2022-05-04 MED ORDER — RANOLAZINE ER 500 MG PO TB12: 500.0000 mg | ORAL_TABLET | Freq: Two times a day (BID) | ORAL | 3 refills | Status: DC

## 2022-05-04 NOTE — Patient Instructions (Signed)
Medication Instructions:  - Your physician has recommended you make the following change in your medication:   1) STOP Benazepril  2) START Losartan 100 mg: - take 1 tablet by mouth once daily   3) START Lasix (furosemide) 20 mg: - take 1 tablet by mouth x 4 doses over the next week - then take 1 tablet by mouth once daily as needed for lower extremity swelling  *If you need a refill on your cardiac medications before your next appointment, please call your pharmacy*   Lab Work: - none ordered  If you have labs (blood work) drawn today and your tests are completely normal, you will receive your results only by: MyChart Message (if you have MyChart) OR A paper copy in the mail If you have any lab test that is abnormal or we need to change your treatment, we will call you to review the results.   Testing/Procedures: - You have been referred to : Dr. Steffanie Dunn- for consideration of atrial fibrillation ablation  Follow-Up: At [facility], you and your health needs are our priority.  As part of our continuing mission to provide you with exceptional heart care, we have created designated Provider Care Teams.  These Care Teams include your primary Cardiologist (physician) and Advanced Practice Providers (APPs -  Physician Assistants and Nurse Practitioners) who all work together to provide you with the care you need, when you need it.  We recommend signing up for the patient portal called "MyChart".  Sign up information is provided on this After Visit Summary.  MyChart is used to connect with patients for Virtual Visits (Telemedicine).  Patients are able to view lab/test results, encounter notes, upcoming appointments, etc.  Non-urgent messages can be sent to your provider as well.   To learn more about what you can do with MyChart, go to [email or web address].    Your next appointment:   1 year(s)  The format for your next appointment:   In Person  Provider:    Sherryl Manges, MD    Other Instructions Losartan Tablets What is this medication? LOSARTAN (loe SAR tan) treats high blood pressure. It may also be used to prevent a stroke in people with heart disease and high blood pressure. It can be used to prevent kidney damage in people with diabetes. It works by relaxing the blood vessels, which helps decrease the amount of work your heart has to do. It belongs to a group of medications called ARBs. This medicine may be used for other purposes; ask your health care provider or pharmacist if you have questions. COMMON BRAND NAME(S): Cozaar What should I tell my care team before I take this medication? They need to know if you have any of these conditions: Heart failure Kidney disease Liver disease An unusual or allergic reaction to losartan, other medications, foods, dyes, or preservatives Pregnant or trying to get pregnant Breast-feeding How should I use this medication? Take this medication by mouth. Take it as directed on the prescription label at the same time every day. You can take it with or without food. If it upsets your stomach, take it with food. Keep taking it unless your care team tells you to stop. Talk to your care team about the use of this medication in children. While it may be prescribed for children as young as 6 for selected conditions, precautions do apply. Overdosage: If you think you have taken too much of this medicine contact a poison control center or emergency room  at once. NOTE: This medicine is only for you. Do not share this medicine with others. What if I miss a dose? If you miss a dose, take it as soon as you can. If it is almost time for your next dose, take only that dose. Do not take double or extra doses. What may interact with this medication? Aliskiren ACE inhibitors, like enalapril or lisinopril Diuretics, especially amiloride, eplerenone, spironolactone, or triamterene Lithium NSAIDs, medications for pain  and inflammation, like ibuprofen or naproxen Potassium salts or potassium supplements This list may not describe all possible interactions. Give your health care provider a list of all the medicines, herbs, non-prescription drugs, or dietary supplements you use. Also tell them if you smoke, drink alcohol, or use illegal drugs. Some items may interact with your medicine. What should I watch for while using this medication? Visit your care team for regular check ups. Check your blood pressure as directed. Ask your care team what your blood pressure should be. Also, find out when you should contact them. Do not treat yourself for coughs, colds, or pain while you are using this medication without asking your care team for advice. Some medications may increase your blood pressure. Women should inform their care team if they wish to become pregnant or think they might be pregnant. There is a potential for serious side effects to an unborn child. Talk to your care team for more information. You may get drowsy or dizzy. Do not drive, use machinery, or do anything that needs mental alertness until you know how this medication affects you. Do not stand or sit up quickly, especially if you are an older patient. This reduces the risk of dizzy or fainting spells. Alcohol can make you more drowsy and dizzy. Avoid alcoholic drinks. Avoid salt substitutes unless you are told otherwise by your care team. What side effects may I notice from receiving this medication? Side effects that you should report to your care team as soon as possible: Allergic reactions--skin rash, itching, hives, swelling of the face, lips, tongue, or throat High potassium level--muscle weakness, fast or irregular heartbeat Kidney injury--decrease in the amount of urine, swelling of the ankles, hands, or feet Low blood pressure--dizziness, feeling faint or lightheaded, blurry vision Side effects that usually do not require medical attention  (report to your care team if they continue or are bothersome): Dizziness Headache Runny or stuffy nose This list may not describe all possible side effects. Call your doctor for medical advice about side effects. You may report side effects to FDA at 1-800-FDA-1088. Where should I keep my medication? Keep out of the reach of children and pets. Store at room temperature between 20 and 25 degrees C (68 and 77 degrees F). Protect from light. Keep the container tightly closed. Get rid of any unused medication after the expiration date. To get rid of medications that are no longer needed or have expired: Take the medication to a medication take-back program. Check with your pharmacy or law enforcement to find a location. If you cannot return the medication, check the label or package insert to see if the medication should be thrown out in the garbage or flushed down the toilet. If you are not sure, ask your care team. If it is safe to put in the trash, empty the medication out of the container. Mix the medication with cat litter, dirt, coffee grounds, or other unwanted substance. Seal the mixture in a bag or container. Put it in the trash. NOTE:  This sheet is a summary. It may not cover all possible information. If you have questions about this medicine, talk to your doctor, pharmacist, or health care provider.  2023 Elsevier/Gold Standard (2020-06-15 00:00:00)   Important Information About Sugar

## 2022-05-04 NOTE — Progress Notes (Signed)
Patient ID: Jose Garcia, male   DOB: 1960/11/30, 61 y.o.   MRN: [id number]      Patient Care Team: Olin Hauser, DO as PCP - General (Family Medicine)   HPI  Jose Garcia is a 61 y.o. male Is seen in followup forparoxysmal atrial fibrillation associated with a transient cardiomyopathy.  On flecainide with most recent cardioversion 1/16.  Not on anticoagulation with a CHA2DS2-VASc score of 1 for treated hypertension    Weight remains an issue.  No significant atrial fibrillation of which he is aware.  Blood pressure hormones are normal in the 130s.  Minimal dyspnea.  No chest pain; some peripheral edema  Treated OSA and without nocturnal dyspnea; frequent hacking cough   Date                PR  QRSd dose  6/15  94 o  2/16  104 100   3/16  116 150 bid decreased 2/2 QRSd   6/16  116 100  7/16  110 100  12/17`  108 100  7/18  92 100  3/19 180 114 100  9/19 164 102 100  10/20  170 102 100  12/21 188 98 (disagree with reading) 100  8/22 178 102 100  11/23 170 98 100          DATE TEST EF   2/15 TEE  50-55 %   1/16 Myoview  No perfusion defects  3//16 Echo   55-60 % `  3/19 CaScore  0     Thromboembolic risk factors ( HTN-1) for a CHADSVASc Score of 1 >>  No anticoagulation   Date Cr K Hgb LDL  12/17  4.1  124  7/18  0.91 4.3  124  9/19  3.7    5/21 0.89 3.9 14.5 124  5/23 0.92 4.1 14.6     Past Medical History:  Diagnosis Date   Atrial fibrillation ([place])    Cardiomyopathy    Resolved 05/03/09   Hyperlipidemia    Hypertension 03/03/96   Sleep apnea     Past Surgical History:  Procedure Laterality Date   CARDIOVERSION      Current Outpatient Medications  Medication Sig Dispense Refill   benazepril (LOTENSIN) 20 MG tablet Take 1 tablet (20 mg total) by mouth 2 (two) times daily. 180 tablet 0   diltiazem (CARDIZEM CD) 120 MG 24 hr capsule TAKE 1 CAPSULE BY MOUTH DAILY. 90 capsule 0   flecainide (TAMBOCOR) 100 MG tablet Take 1 tablet (100  mg total) by mouth 2 (two) times daily. 180 tablet 0   fluticasone (FLONASE) 50 MCG/ACT nasal spray Place 2 sprays into both nostrils daily. Use for 4-6 weeks then stop and use seasonally or as needed. 48 g 3   hydrochlorothiazide (HYDRODIURIL) 25 MG tablet TAKE 1 TABLET(25 MG) BY MOUTH DAILY 90 tablet 2   ipratropium (ATROVENT) 0.06 % nasal spray USE 2 SPRAYS IN EACH NOSTRIL AT BEDTIME AS NEEDED FOR RHINITIS 15 mL 2   loratadine (CLARITIN) 10 MG tablet Take 10 mg by mouth daily as needed for allergies.     ranolazine (RANEXA) 500 MG 12 hr tablet Take 1 tablet (500 mg total) by mouth 2 (two) times daily. PLEASE SCHEDULE OFFICE VISIT FOR FURTHER REFILLS. THANK YOU! 180 tablet 0   No current facility-administered medications for this visit.    Allergies  Allergen Reactions   Shellfish Allergy Itching    Review of Systems negative except from HPI and  PMH  Physical Exam:  BP 132/70   Pulse (!) 55   Ht 6' (1.829 m)   Wt 285 lb 8 oz (129.5 kg)   SpO2 99%   BMI 38.72 kg/m  Well developed and Morbidly obese in no acute distress HENT normal Neck supple with JVP-flat Clear Regular rate and rhythm, no  gallop No  murmur Abd-soft with active BS No Clubbing cyanosis 2+ edema Skin-warm and dry A & Oriented  Grossly normal sensory and motor function  ECG sinus at 55 Interval 17/10/45    Assessment and  Plan:  Atrial fibrillation persistent-- RVR on flecainide/ranolazine   Hypertension  Edema  Hyperlipidemia >> Ca score 0 3/19  Obesity  Cough   No interval atrial fibrillation of which she is aware.  We will continue the flecainide and the ranolazine; we discussed discontinuing the ranolazine but he reminded me that flecainide by itself was not sufficient.  We then discussed catheter ablation as an alternative for his persistent atrial fibrillation.  He would like to pursue this, I will arrange consultation with Dr. Merita Norton.  We reviewed not only some of the cabana data, but also the  [place] meta-analysis.  Blood pressure was elevated on arrival, repeat was within range.  So we will keep continue him on diltiazem 120, HydroDIURIL 25 but we will change, because of his cough, his lisinopril--losartan 100 mg daily.  With his volume overload, we will add furosemide 20 mg to take 4 times over the next week and then to use as needed.

## 2022-05-16 DIAGNOSIS — M2578 Osteophyte, vertebrae: Secondary | ICD-10-CM | POA: Diagnosis not present

## 2022-05-16 DIAGNOSIS — M4810 Ankylosing hyperostosis [Forestier], site unspecified: Secondary | ICD-10-CM | POA: Diagnosis not present

## 2022-05-17 DIAGNOSIS — M47812 Spondylosis without myelopathy or radiculopathy, cervical region: Secondary | ICD-10-CM | POA: Diagnosis not present

## 2022-05-17 DIAGNOSIS — S161XXA Strain of muscle, fascia and tendon at neck level, initial encounter: Secondary | ICD-10-CM | POA: Diagnosis not present

## 2022-05-17 DIAGNOSIS — M542 Cervicalgia: Secondary | ICD-10-CM | POA: Diagnosis not present

## 2022-05-30 NOTE — Progress Notes (Signed)
Electrophysiology Office Follow up Visit Note:    Date:  05/31/2022   ID:  Jose Garcia, DOB May 07, 1961, MRN [id number]  PCP:  Smitty Cords, DO  [facility] Cardiologist:  None  [facility] Electrophysiologist:  None    Interval History:    Jose Garcia is a 61 y.o. male who presents for a follow up visit. They were last seen in clinic 05/04/2022 by Dr Graciela Husbands. The patient has been previously maintained on flecainide. He presents today to discuss catheter ablation as a mechanism to avoid long term AAD use.  He is with his wife today in clinic.     Past Medical History:  Diagnosis Date   Atrial fibrillation (HCC)    Cardiomyopathy    Resolved 05/03/09   Hyperlipidemia    Hypertension 03/03/96   Sleep apnea     Past Surgical History:  Procedure Laterality Date   CARDIOVERSION      Current Medications: Current Meds  Medication Sig   diltiazem (CARDIZEM CD) 120 MG 24 hr capsule TAKE 1 CAPSULE BY MOUTH DAILY.   flecainide (TAMBOCOR) 100 MG tablet Take 1 tablet (100 mg total) by mouth 2 (two) times daily.   fluticasone (FLONASE) 50 MCG/ACT nasal spray Place 2 sprays into both nostrils daily. Use for 4-6 weeks then stop and use seasonally or as needed.   furosemide (LASIX) 20 MG tablet Take 1 tablet (20 mg) by mouth x 4 doses over the next week, then take 1 tablet daily as needed for lower extremity swelling   hydrochlorothiazide (HYDRODIURIL) 25 MG tablet TAKE 1 TABLET(25 MG) BY MOUTH DAILY   ipratropium (ATROVENT) 0.06 % nasal spray USE 2 SPRAYS IN EACH NOSTRIL AT BEDTIME AS NEEDED FOR RHINITIS   loratadine (CLARITIN) 10 MG tablet Take 10 mg by mouth daily as needed for allergies.   losartan (COZAAR) 100 MG tablet Take 1 tablet (100 mg total) by mouth daily.   ranolazine (RANEXA) 500 MG 12 hr tablet Take 1 tablet (500 mg total) by mouth 2 (two) times daily.     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   Social Determinants of Health   Financial Resource Strain: Not on file  Food Insecurity: Not on file  Transportation Needs: Not on file  Physical Activity: Not on file  Stress: Not on file  Social Connections: Not on file     Family History: The patient's family history includes Cancer in his mother; Heart disease in his mother; Hypertension in his father and mother. There is no history of Diabetes, Coronary artery disease, Colon cancer, or Prostate cancer.  ROS:   Please see the history of present illness.    All other systems reviewed and are negative.  EKGs/Labs/Other Studies  Reviewed:    The following studies were reviewed today:  09/2014 echo - EF 55%  EKG today shows sinus rhythm, incomplete right bundle branch block with a QRS duration of 108 ms and a PR interval of 168 ms  Recent Labs: 11/09/2021: ALT 21; BUN 15; Creat 0.92; Hemoglobin 14.6; Platelets 152; Potassium 4.1; Sodium 141; TSH 1.85  Recent Lipid Panel    Component Value Date/Time   CHOL 191 11/09/2021 0859   CHOL 184 06/15/2016 0755   TRIG 68 11/09/2021 0859   HDL 53 11/09/2021 0859   HDL 45 06/15/2016 0755   CHOLHDL 3.6 11/09/2021 0859   VLDL 18 12/31/2007 1527   LDLCALC 122 (H) 11/09/2021 0859    Physical Exam:    VS:  BP 138/78   Pulse 80   Ht 6' (1.829 m)   Wt 285 lb (129.3 kg)   SpO2 97%   BMI 38.65 kg/m     Wt Readings from Last 3  Encounters:  05/31/22 285 lb (129.3 kg)  05/04/22 285 lb 8 oz (129.5 kg)  12/20/21 280 lb (127 kg)     GEN:  Well nourished, well developed in no acute distress.  Obese HEENT: Normal NECK: No JVD; No carotid bruits LYMPHATICS: No lymphadenopathy CARDIAC: RRR, no murmurs, rubs, gallops RESPIRATORY:  Clear to auscultation without rales, wheezing or rhonchi  ABDOMEN: Soft, non-tender, non-distended MUSCULOSKELETAL:  No edema; No deformity  SKIN: Warm and dry NEUROLOGIC:  Alert and oriented x 3 PSYCHIATRIC:  Normal affect        ASSESSMENT:    1. Paroxysmal atrial fibrillation (HCC)   2. OSA on CPAP   3. Primary hypertension    PLAN:    In order of problems listed above:   #AF Symptomatic. treated with flecainide/ranolazine. Not on OAC due to CHADSVASc of 1.  Discussed treatment options today for their AF including antiarrhythmic drug therapy and ablation. Discussed risks, recovery and likelihood of success. Discussed potential need for repeat ablation procedures and antiarrhythmic drugs after an initial ablation. They wish to proceed with scheduling.  Risk, benefits, and alternatives to EP study and radiofrequency ablation for afib were also discussed in detail today. These risks include but are not limited to stroke, bleeding, vascular damage, tamponade, perforation, damage to the esophagus, lungs, and other structures, pulmonary vein stenosis, worsening renal function, and death. The patient understands these risk and wishes to proceed.  We will therefore proceed with catheter ablation at the next available time.  Carto, ICE, anesthesia are requested for the procedure.  Will also obtain CT PV protocol prior to the procedure to exclude LAA thrombus and further evaluate atrial anatomy.  Plan to start eliquis 4 weeks prior to ablation and continue for at least 6 months post ablation.  Will need echo prior to procedure.  Will keep flecainide and ranolazine both on until 3  months after the ablation.  If he has had no recurrence of arrhythmia during the 90 days after ablation, plan to stop ranolazine.  Would stop flecainide 4 months after the ablation.  Medication Adjustments/Labs and Tests Ordered: Current medicines are reviewed at length with the patient today.  Concerns regarding medicines are outlined above.  No orders of the defined types were placed in this encounter.  No orders of the defined types were placed in this encounter.    Signed, Steffanie Dunn, MD, [facility], [facility] 05/31/2022 8:50 AM    Electrophysiology [facility]

## 2022-05-31 ENCOUNTER — Encounter: Payer: Self-pay | Admitting: Cardiology

## 2022-05-31 ENCOUNTER — Ambulatory Visit: Payer: BC Managed Care – PPO | Attending: Cardiology | Admitting: Cardiology

## 2022-05-31 VITALS — BP 138/78 | HR 80 | Ht 72.0 in | Wt 285.0 lb

## 2022-05-31 DIAGNOSIS — I480 Paroxysmal atrial fibrillation: Secondary | ICD-10-CM

## 2022-05-31 DIAGNOSIS — I10 Essential (primary) hypertension: Secondary | ICD-10-CM | POA: Diagnosis not present

## 2022-05-31 DIAGNOSIS — G4733 Obstructive sleep apnea (adult) (pediatric): Secondary | ICD-10-CM

## 2022-05-31 NOTE — Patient Instructions (Addendum)
Medication Instructions:  Your physician has recommended you make the following change in your medication:  1) START taking Eliquis 5 mg twice daily, 4 weeks prior to your ablation *If you need a refill on your cardiac medications before your next appointment, please call your pharmacy*  Lab Work: BMET and CBC prior to CT scan If you have labs (blood work) drawn today and your tests are completely normal, you will receive your results only by: MyChart Message (if you have MyChart) OR A paper copy in the mail If you have any lab test that is abnormal or we need to change your treatment, we will call you to review the results.  Testing/Procedures: Your physician has requested that you have an echocardiogram. Echocardiography is a painless test that uses sound waves to create images of your heart. It provides your doctor with information about the size and shape of your heart and how well your heart's chambers and valves are working. This procedure takes approximately one hour. There are no restrictions for this procedure. Please do NOT wear cologne, perfume, aftershave, or lotions (deodorant is allowed). Please arrive 15 minutes prior to your appointment time.  Your physician has requested that you have cardiac CT. Cardiac computed tomography (CT) is a painless test that uses an x-ray machine to take clear, detailed pictures of your heart. For further information please visit [email or web address] Please follow instruction sheet as given.   Your physician has recommended that you have an ablation. Catheter ablation is a medical procedure used to treat some cardiac arrhythmias (irregular heartbeats). During catheter ablation, a long, thin, flexible tube is put into a blood vessel in your groin (upper thigh), or neck. This tube is called an ablation catheter. It is then guided to your heart through the blood vessel. Radio frequency waves destroy small areas of heart tissue where abnormal heartbeats  may cause an arrhythmia to start. Please see the instruction sheet given to you today.  Follow-Up: At [facility], you and your health needs are our priority.  As part of our continuing mission to provide you with exceptional heart care, we have created designated Provider Care Teams.  These Care Teams include your primary Cardiologist (physician) and Advanced Practice Providers (APPs -  Physician Assistants and Nurse Practitioners) who all work together to provide you with the care you need, when you need it.  Your next appointment:   See instruction letter  Important Information About Sugar

## 2022-06-07 DIAGNOSIS — M2578 Osteophyte, vertebrae: Secondary | ICD-10-CM | POA: Diagnosis not present

## 2022-06-07 DIAGNOSIS — R1312 Dysphagia, oropharyngeal phase: Secondary | ICD-10-CM | POA: Diagnosis not present

## 2022-06-08 ENCOUNTER — Other Ambulatory Visit: Payer: Self-pay | Admitting: *Deleted

## 2022-06-08 MED ORDER — FUROSEMIDE 20 MG PO TABS: ORAL_TABLET | ORAL | 5 refills | Status: DC

## 2022-06-14 ENCOUNTER — Other Ambulatory Visit: Payer: BC Managed Care – PPO

## 2022-07-11 ENCOUNTER — Other Ambulatory Visit: Payer: Self-pay | Admitting: Internal Medicine

## 2022-07-18 NOTE — Therapy (Signed)
OUTPATIENT PHYSICAL THERAPY NECK EVALUATION  Patient Name: Jose Garcia MRN: [id number] DOB:[birth year], 62 y.o., male Today's Date: 07/21/2022   PT End of Session - 07/21/22 1343     Visit Number 1    Number of Visits 17    Date for PT Re-Evaluation 09/13/22    Authorization Type eval: 07/19/22    PT Start Time 1102    PT Stop Time 1200    PT Time Calculation (min) 58 min    Activity Tolerance Patient tolerated treatment well    Behavior During Therapy [facility] for tasks assessed/performed            Past Medical History:  Diagnosis Date   Atrial fibrillation ([place])    Cardiomyopathy    Resolved 05/03/09   Hyperlipidemia    Hypertension 03/03/96   Sleep apnea    Past Surgical History:  Procedure Laterality Date   CARDIOVERSION     Patient Active Problem List   Diagnosis Date Noted   OSA on CPAP 10/08/2019   Post-nasal drainage 10/08/2019   Morbid obesity ([place]) 05/05/2015   ED (erectile dysfunction) 05/05/2015   Cardiomyopathy ([place]) 11/06/2014   EDEMA 01/07/2010   Paroxysmal atrial fibrillation ([place]) 01/22/2009   HYPERCHOLESTEROLEMIA 12/31/2007   Essential hypertension 12/31/2007    PCP: Olin Hauser, DO  REFERRING PROVIDER: Lanice Schwab, NP  REFERRING DIAGNOSIS: Cervicalgia  THERAPY DIAG: Cervicalgia  RATIONALE FOR EVALUATION AND TREATMENT: Rehabilitation  ONSET DATE: Approximately 10-12 years ago  FOLLOW UP APPT WITH PROVIDER: Yes, plans to see MD for a physical this year   SUBJECTIVE:                                                                                                                                                                                         Chief Complaint: Neck pain and limited motion;  Pertinent History Pt referred to physical therapy for limited neck mobility and pain. Mobility deficits have progressed for the last 10-12 years. He had seen chiropractor in the past but is not seeing one currently. No prior history  of head or neck surgery. He has had both a cervical CT and MRI which showed multilevel degenerative changes of the cervical spine. This included OPLL with resulting severe spinal canal stenosis and cord compression at C3-C4 and C4-C5 without underlying cord signal abnormality. Foraminal narrowing, severe on the right at C3-C4 and C4-C5 and moderate at other levels as above. Large, bulky osteophytes along the anterior aspect of the upper cervical spine extending from approximately C1-C5. There is mass effect on the posterior aspect of the oropharynx. Pt is having difficulty swallowing and reports that  his esophagus feels "closed up." Pt has consulted with neurosurgery but per pt report none of the surgeons have encouraged him to proceed with surgery given the high risk of worsening his symptoms.   MRI CERVICAL SPINE WITHOUT CONTRAST (01/20/22) FINDINGS:  Alignment: Normal alignment  Bone marrow Signal: no suspicious lesions  Spinal cord: Normal signal. There is complete effacement of the anterior  and posterior CSF space at the C3-C4 (9:24) and C4-C5 (9:18) levels  resulting in cord compression. There is no underlying cord signal  abnormality.  Flowing anterior osteophytes consistent with DISH. OPLL.  Paraspinal Soft Tissues: Mild prevertebral edema.  Occiput-C1: Mild degenerative changes.  [place]-dental interval: Degenerative narrowing  C1-2 lateral masses: no significant degenerative change  C2-C3: Disc osteophyte complex. Moderate facet arthrosis, right greater  than left. Mild spinal canal stenosis. No foraminal narrowing.  C3-C4: Disc osteophyte complex. Severe spinal canal stenosis and cord  compression. Severe right and moderate left foraminal narrowing.  C4-C5: OPLL. Severe spinal canal stenosis and cord compression. Severe  right and mild left foraminal narrowing.  C5-C6: OPLL. Moderate spinal canal stenosis. Mild bilateral foraminal  narrowing.  C6-C7: OPLL. Moderate spinal canal  stenosis. No foraminal narrowing.  C7-T1: no significant stenosis   IMPRESSION:  1. Multilevel degenerative changes of the cervical spine. OPLL with  resulting severe spinal canal stenosis and cord compression at C3-C4 and  C4-C5. No underlying cord signal abnormality.  2.  Foraminal narrowing, severe on the right at C3-C4 and C4-C5 and  moderate at other levels as above.   Procedure: CT CERVICAL SPINE WITHOUT CONTRAST (01/20/22) CERVICAL SPINE FINDINGS:  Alignment: Normal.  Occipital Condyles: Intact.  Bones: No fractures or facet dislocations.  Intervertebral Discs: Normal.  Prevertebral soft tissues: No prevertebral soft tissue swelling.  Regional Soft Tissues: The regional soft tissues are unremarkable.  Lung Apices: The partially visualized lung apices are clear.  Skull Base: The partially imaged skull base is unremarkable.  Degenerative changes:  Large, bulky osteophytes along the anterior aspect of the upper cervical  spine extending from approximately C1-C5. There is mass effect on the  posterior aspect of the oropharynx. There is cervical spine DISH and OPLL  extending from C4-T1.  C1-C2: Extensive heterotopic osseous formation about the atlantodens  interval.  C2-C3: Uncovertebral degenerative changes. No high-grade spinal canal  stenosis.  C3-C4: Uncovertebral and facet degenerative changes. OPLL results in severe  canal stenosis.  C4-C5: Uncovertebral and facet degenerative changes as well as OPLL,  resulting in severe canal stenosis.  C5-C6: Facet and uncovertebral degenerative changes. OPLL.  C6-C7: Facet and uncovertebral degenerative change. OPLL. Mild canal  stenosis.  C7-T1: No high-grade spinal canal or neuroforaminal stenosis.   IMPRESSION:  Extensive degenerative changes of the cervical spine with multilevel DISH  and OPLL. There is severe spinal canal stenosis at C3-C4 and C4-C5 better  evaluated on same day MRI.   Pain:  Pain Intensity: Present: 0/10,  Best: 0/10, Worst: 10/10 Pain location: R upper trap extending to the inside of the R shoulder blade, tightness on the left side of neck; Pain Quality: general tightness and muscle strain Radiating: Yes, Pain occasionally radiates from the R side of his neck to the top of his R shoulder; Numbness/Tingling: Yes, R side of neck and top of R shoulder, 2x/month numbness in R hand Focal Weakness: No Aggravating factors: Getting into/out of cars due to laterally flexing and rotating his head (takes a day or two to recover), most activities increase his pain, no real  change with TENS Relieving factors: rest after strain, Tylenol/ibuprofen, no significant difference with muscle relaxers, heat, ice helps when pt has significant muscle spasms, massage (self-massage and pt has had professional massage as well), minimal benefit from PT multiple years ago.  24-hour pain behavior: pain is worse at the end of the day History of prior neck injury, pain, surgery, or therapy: No, no surgery or notable trauma to neck Falls: Has patient fallen in last 6 months? 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  Recent memory is intact.  Remote memory is intact.  Attention span and concentration  are intact.  Expressive speech is intact.  Patient's fund of knowledge is within normal limits for educational level.    Gross Musculoskeletal Assessment Tremor: None Bulk: Normal Tone: Normal  Gait Deferred  Posture Upper thoracic kyphosis noted with forward head and rounded shoulders. Unable to correct with cues. Limited spontaneous cervical motion noted  AROM AROM (Normal range in degrees) AROM 07/19/2022  Cervical  Flexion (50) 25  Extension (80) 8  Right lateral flexion (45) 10  Left lateral flexion (45) 6  Right rotation (85) 15  Left rotation (85) 16  (* = pain; Blank rows = not tested)   MMT MMT (out of 5) Right 07/19/2022 Left 07/19/2022  Cervical (isometric)  Flexion WNL  Extension WNL  Lateral Flexion WNL WNL  Rotation WNL WNL      Shoulder   Flexion 5 5  Extension    Abduction 5 5  Internal rotation    Horizontal abduction    Horizontal adduction    Lower Trapezius    Rhomboids        Elbow  Flexion 5 5  Extension 5 5  Pronation    Supination        Wrist  Flexion 5 5  Extension 5 5  Radial deviation    Ulnar deviation        MCP  Flexion 5 5  Extension 5 5  Abduction 5 5  Adduction 5 5  (* = pain; Blank rows = not tested)  Sensation Deferred  Reflexes Deferred  Palpation  Location LEFT  RIGHT           Suboccipitals 0 0  Cervical paraspinals 0 0  Upper Trapezius 0 0  Levator Scapulae 0 0  Rhomboid Major/Minor 0 0  (Blank rows = not tested) Graded on 0-4 scale (0 = no pain, 1 = pain, 2 = pain with wincing/grimacing/flinching, 3 = pain with withdrawal, 4 = unwilling to allow palpation), (Blank rows = not tested)  Repeated Movements Deferred   Passive Accessory Intervertebral Motion Deferred  SPECIAL TESTS Spurlings A (ipsilateral lateral flexion/axial compression): R: Negative L: Negative Spurlings B (ipsilateral lateral flexion/contralateral rotation/axial compression): R: Negative L: Negative Distraction Test: Not  examined  Heber Wallace  Sign (cervical cord compression): R: Not examined L: Not examined ULTT Median: R: Not examined L: Not examined ULTT Ulnar: R: Not examined L: Not examined ULTT Radial: R: Not examined L: Not examined   TODAY'S TREATMENT    Trigger Point Dry Needling (TDN), unbilled Education performed with patient regarding potential benefit of TDN. Reviewed precautions and risks with patient. Pt provided verbal consent to treatment. TDN performed to bilateral upper traps with 4, 0.25 x 40 single needle placements (2 on each side) with local twitch response (LTR) during all placements. Pistoning technique utilized. After dry needling performed lateral flexion MET cervical stretches with improvement in range of motion.   PATIENT EDUCATION:  Education details: Plan of care and HEP Person educated: Patient and wife Education method: Explanation and handout Education comprehension: verbalized understanding and returned demonstration   HOME EXERCISE PROGRAM: Access Code: [email or web address] URL: [email or web address] Date: 07/19/2022 Prepared by: Ria Comment  Exercises - Seated Cervical Sidebending Stretch  - 4-6 x daily - 7 x weekly - 2-3 reps - 30s hold - Seated Cervical Retraction  - 1-2 x daily - 7 x weekly - 2 sets - 10 reps - 3s hold - Seated Scapular Retraction  - 1-2 x daily - 7 x weekly - 2 sets - 10 reps - 3s hold   ASSESSMENT:  CLINICAL IMPRESSION: Patient is a 62 y.o. male who was seen today for physical therapy evaluation and treatment for neck pain and severe limitation in motion. Objective impairments include decreased ROM, impaired flexibility, and pain. These impairments are limiting patient from meal prep, cleaning, driving, shopping, community activity, and occupation. Personal factors including Past/current experiences, Time since onset of injury/illness/exacerbation, and 1-2 comorbidities: afib and OSA  are also affecting patient's functional outcome. Patient  will benefit from skilled PT to address above impairments and improve overall function.  REHAB POTENTIAL: Fair    CLINICAL DECISION MAKING: Evolving/moderate complexity  EVALUATION COMPLEXITY: Moderate   GOALS: Goals reviewed with patient? Yes  SHORT TERM GOALS: Target date: 08/16/2022   Pt will be independent with HEP to improve strength and decrease neck pain to improve pain-free function at home and work. Baseline:  Goal status: INITIAL  LONG TERM GOALS: Target date: 09/13/2022   Pt will increase FOTO to at least 61 to demonstrate significant improvement in function at home and work related to neck pain  Baseline: 07/19/22: 49 Goal status: INITIAL  2.  Pt will decrease worst neck pain by at least 2 points on the NPRS in order to demonstrate clinically significant reduction in neck pain. Baseline: 07/19/22: 10/10 Goal status: INITIAL  3.  Pt will decrease NDI score by at least 19% in order demonstrate clinically significant reduction in neck pain/disability.       Baseline: 07/19/22: To be completed Goal status: INITIAL  4.  Pt will increase cervical extension, bilateral rotation, and bilateral lateral flexion by at least 10 degrees each in order to improve his ability to work and drive with less pain Baseline: 07/19/22: extension: 8, lateral flexion (R/L): 10/6, rotation (R/L): 15/16; Goal status: INITIAL   PLAN: PT FREQUENCY: 1-2x/week  PT DURATION: 8 weeks  PLANNED INTERVENTIONS: Therapeutic exercises, Therapeutic activity, Neuromuscular re-education, Balance training, Gait training, Patient/Family education, Joint manipulation, Joint mobilization, Vestibular training, Canalith repositioning, Dry Needling, Electrical stimulation, Spinal manipulation, Spinal mobilization, Cryotherapy, Moist heat, Taping, Traction, Ultrasound, Ionotophoresis 4mg /ml Dexamethasone, and Manual therapy  PLAN FOR NEXT SESSION: Complete NDI, Sensation and reflex testing, PAM testing, Hoffman sign,  assess response to  TDN, consider mechanical cervical traction, review and modify HEP as necessary  Sharalyn Ink Gwendolen Hewlett PT, DPT, GCS  Nalayah Hitt 07/21/2022, 2:02 PM

## 2022-07-19 ENCOUNTER — Ambulatory Visit: Payer: BC Managed Care – PPO | Attending: Family

## 2022-07-19 DIAGNOSIS — M542 Cervicalgia: Secondary | ICD-10-CM

## 2022-07-21 ENCOUNTER — Ambulatory Visit: Payer: BC Managed Care – PPO | Attending: Cardiology

## 2022-07-21 DIAGNOSIS — I480 Paroxysmal atrial fibrillation: Secondary | ICD-10-CM

## 2022-07-21 DIAGNOSIS — I10 Essential (primary) hypertension: Secondary | ICD-10-CM | POA: Diagnosis not present

## 2022-07-21 DIAGNOSIS — G4733 Obstructive sleep apnea (adult) (pediatric): Secondary | ICD-10-CM | POA: Diagnosis not present

## 2022-07-21 LAB — ECHOCARDIOGRAM COMPLETE
AR max vel: 3.82 cm2
AV Area VTI: 3.81 cm2
AV Area mean vel: 3.77 cm2
AV Mean grad: 5 mmHg
AV Peak grad: 9.6 mmHg
Ao pk vel: 1.55 m/s
Area-P 1/2: 3.31 cm2
Calc EF: 54.5 %
S' Lateral: 3.2 cm
Single Plane A2C EF: 54.2 %
Single Plane A4C EF: 55.7 %

## 2022-07-26 ENCOUNTER — Ambulatory Visit: Payer: BC Managed Care – PPO

## 2022-07-26 DIAGNOSIS — M542 Cervicalgia: Secondary | ICD-10-CM | POA: Diagnosis not present

## 2022-07-26 NOTE — Therapy (Signed)
OUTPATIENT PHYSICAL THERAPY NECK TREATMENT  Patient Name: Jose Garcia MRN: [id number] DOB:1961-05-15, 62 y.o., male Today's Date: 07/29/2022   PT End of Session - 07/29/22 1531     Visit Number 2    Number of Visits 17    Date for PT Re-Evaluation 09/13/22    Authorization Type eval: 07/19/22    PT Start Time 1400    PT Stop Time 1445    PT Time Calculation (min) 45 min    Activity Tolerance Patient tolerated treatment well    Behavior During Therapy [facility] for tasks assessed/performed            Past Medical History:  Diagnosis Date   Atrial fibrillation (HCC)    Cardiomyopathy    Resolved 05/03/09   Hyperlipidemia    Hypertension 03/03/96   Sleep apnea    Past Surgical History:  Procedure Laterality Date   CARDIOVERSION     Patient Active Problem List   Diagnosis Date Noted   OSA on CPAP 10/08/2019   Post-nasal drainage 10/08/2019   Morbid obesity (HCC) 05/05/2015   ED (erectile dysfunction) 05/05/2015   Cardiomyopathy (HCC) 11/06/2014   EDEMA 01/07/2010   Paroxysmal atrial fibrillation (HCC) 01/22/2009   HYPERCHOLESTEROLEMIA 12/31/2007   Essential hypertension 12/31/2007   PCP: Smitty Cords, DO  REFERRING PROVIDER: Ronne Binning, NP  REFERRING DIAGNOSIS: Cervicalgia  THERAPY DIAG: Cervicalgia  RATIONALE FOR EVALUATION AND TREATMENT: Rehabilitation  ONSET DATE: Approximately 10-12 years ago  FOLLOW UP APPT WITH PROVIDER: Yes, plans to see MD for a physical this year  FROM INITIAL EVALUATION SUBJECTIVE:                                                                                                                                                                                         Chief Complaint: Neck pain and limited motion;  Pertinent History Pt referred to physical therapy for limited neck mobility and pain. Mobility deficits have progressed for the last 10-12 years. He had seen chiropractor in the past but is not seeing one  currently. No prior history of head or neck surgery. He has had both a cervical CT and MRI which showed multilevel degenerative changes of the cervical spine. This included OPLL with resulting severe spinal canal stenosis and cord compression at C3-C4 and C4-C5 without underlying cord signal abnormality. Foraminal narrowing, severe on the right at C3-C4 and C4-C5 and moderate at other levels as above. Large, bulky osteophytes along the anterior aspect of the upper cervical spine extending from approximately C1-C5. There is mass effect on the posterior aspect of the oropharynx. Pt is having difficulty swallowing and reports  that his esophagus feels "closed up." Pt has consulted with neurosurgery but per pt report none of the surgeons have encouraged him to proceed with surgery given the high risk of worsening his symptoms.  MRI CERVICAL SPINE WITHOUT CONTRAST (01/20/22) FINDINGS:  Alignment: Normal alignment  Bone marrow Signal: no suspicious lesions  Spinal cord: Normal signal. There is complete effacement of the anterior  and posterior CSF space at the C3-C4 (9:24) and C4-C5 (9:18) levels  resulting in cord compression. There is no underlying cord signal  abnormality.  Flowing anterior osteophytes consistent with DISH. OPLL.  Paraspinal Soft Tissues: Mild prevertebral edema.  Occiput-C1: Mild degenerative changes.  Atlanto-dental interval: Degenerative narrowing  C1-2 lateral masses: no significant degenerative change  C2-C3: Disc osteophyte complex. Moderate facet arthrosis, right greater  than left. Mild spinal canal stenosis. No foraminal narrowing.  C3-C4: Disc osteophyte complex. Severe spinal canal stenosis and cord  compression. Severe right and moderate left foraminal narrowing.  C4-C5: OPLL. Severe spinal canal stenosis and cord compression. Severe  right and mild left foraminal narrowing.  C5-C6: OPLL. Moderate spinal canal stenosis. Mild bilateral foraminal  narrowing.  C6-C7: OPLL.  Moderate spinal canal stenosis. No foraminal narrowing.  C7-T1: no significant stenosis   IMPRESSION:  1. Multilevel degenerative changes of the cervical spine. OPLL with  resulting severe spinal canal stenosis and cord compression at C3-C4 and  C4-C5. No underlying cord signal abnormality.  2.  Foraminal narrowing, severe on the right at C3-C4 and C4-C5 and  moderate at other levels as above.   Procedure: CT CERVICAL SPINE WITHOUT CONTRAST (01/20/22) CERVICAL SPINE FINDINGS:  Alignment: Normal.  Occipital Condyles: Intact.  Bones: No fractures or facet dislocations.  Intervertebral Discs: Normal.  Prevertebral soft tissues: No prevertebral soft tissue swelling.  Regional Soft Tissues: The regional soft tissues are unremarkable.  Lung Apices: The partially visualized lung apices are clear.  Skull Base: The partially imaged skull base is unremarkable.  Degenerative changes:  Large, bulky osteophytes along the anterior aspect of the upper cervical  spine extending from approximately C1-C5. There is mass effect on the  posterior aspect of the oropharynx. There is cervical spine DISH and OPLL  extending from C4-T1.  C1-C2: Extensive heterotopic osseous formation about the atlantodens  interval.  C2-C3: Uncovertebral degenerative changes. No high-grade spinal canal  stenosis.  C3-C4: Uncovertebral and facet degenerative changes. OPLL results in severe  canal stenosis.  C4-C5: Uncovertebral and facet degenerative changes as well as OPLL,  resulting in severe canal stenosis.  C5-C6: Facet and uncovertebral degenerative changes. OPLL.  C6-C7: Facet and uncovertebral degenerative change. OPLL. Mild canal  stenosis.  C7-T1: No high-grade spinal canal or neuroforaminal stenosis.   IMPRESSION:  Extensive degenerative changes of the cervical spine with multilevel DISH  and OPLL. There is severe spinal canal stenosis at C3-C4 and C4-C5 better  evaluated on same day MRI.   Pain:  Pain  Intensity: Present: 0/10, Best: 0/10, Worst: 10/10 Pain location: R upper trap extending to the inside of the R shoulder blade, tightness on the left side of neck; Pain Quality: general tightness and muscle strain Radiating: Yes, Pain occasionally radiates from the R side of his neck to the top of his R shoulder; Numbness/Tingling: Yes, R side of neck and top of R shoulder, 2x/month numbness in R hand Focal Weakness: No Aggravating factors: Getting into/out of cars due to laterally flexing and rotating his head (takes a day or two to recover), most activities increase his pain, no real  change with TENS Relieving factors: rest after strain, Tylenol/ibuprofen, no significant difference with muscle relaxers, heat, ice helps when pt has significant muscle spasms, massage (self-massage and pt has had professional massage as well), minimal benefit from PT multiple years ago.  24-hour pain behavior: pain is worse at the end of the day History of prior neck injury, pain, surgery, or therapy: No, no surgery or notable trauma to neck Falls: Has patient fallen in last 6 months? 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  Recent memory is intact.  Remote memory is intact.  Attention  span and concentration are intact.  Expressive speech is intact.  Patient's fund of knowledge is within normal limits for educational level.    Gross Musculoskeletal Assessment Tremor: None Bulk: Normal Tone: Normal  Gait Deferred  Posture Upper thoracic kyphosis noted with forward head and rounded shoulders. Unable to correct with cues. Limited spontaneous cervical motion noted  AROM AROM (Normal range in degrees) AROM 07/19/2022  Cervical  Flexion (50) 25  Extension (80) 8  Right lateral flexion (45) 10  Left lateral flexion (45) 6  Right rotation (85) 15  Left rotation (85) 16  (* = pain; Blank rows = not tested)   MMT MMT (out of 5) Right 07/19/2022 Left 07/19/2022  Cervical (isometric)  Flexion WNL  Extension WNL  Lateral Flexion WNL WNL  Rotation WNL WNL      Shoulder   Flexion 5 5  Extension    Abduction 5 5  Internal rotation    Horizontal abduction    Horizontal adduction    Lower Trapezius    Rhomboids        Elbow  Flexion 5 5  Extension 5 5  Pronation    Supination        Wrist  Flexion 5 5  Extension 5 5  Radial deviation    Ulnar deviation        MCP  Flexion 5 5  Extension 5 5  Abduction 5 5  Adduction 5 5  (* = pain; Blank rows = not tested)  Sensation Deferred  Reflexes Deferred  Palpation  Location LEFT  RIGHT           Suboccipitals 0 0  Cervical paraspinals 0 0  Upper Trapezius 0 0  Levator Scapulae 0 0  Rhomboid Major/Minor 0 0  (Blank rows = not tested) Graded on 0-4 scale (0 = no pain, 1 = pain, 2 = pain with wincing/grimacing/flinching, 3 = pain with withdrawal, 4 = unwilling to allow palpation), (Blank rows = not tested)  Repeated Movements Deferred   Passive Accessory Intervertebral Motion Deferred  SPECIAL TESTS Spurlings A (ipsilateral lateral flexion/axial compression): R: Negative L: Negative Spurlings B (ipsilateral lateral flexion/contralateral rotation/axial compression): R: Negative L:  Negative Distraction Test: Not examined  Hoffman Sign (  cervical cord compression): R: Not examined L: Not examined ULTT Median: R: Not examined L: Not examined ULTT Ulnar: R: Not examined L: Not examined ULTT Radial: R: Not examined L: Not examined   TODAY'S TREATMENT    SUBJECTIVE: Pt reports that he is doing well today. He denies any excessive soreness or increase in muscle spasms after the last therapy session. No pain upon arrival and no specific questions or concerns.    PAIN: Denies   Trigger Point Dry Needling (TDN), unbilled Education performed with patient regarding potential benefit of TDN. Previously reviewed precautions and risks with patient. Pt provided verbal consent to treatment. In prone using clean technique TDN performed to bilateral upper traps with 4, 0.25 x 40 single needle placements (2 on each side) with local twitch response (LTR) during all placements (more vigorous laterally). Also performed 2 additional 0.25 x 40 needle placement to cervical multifidi at C3 (one on each side). Pistoning technique utilized.   Manual Therapy [place] PAIVM testing with severe hypomobility C2-T7; Prone moist heat pack to cervical spine during interval history; Prone CPA C2-T3, grade II-III, 20s/bout x 1 bout/level; Prone UPA C2-C5, grade II-III, 20s/bout x 1 bout/level bilaterally; Supine cervical upper traps stretches x 45s bilateral; Supine MET cervical lateral flexion and rotation stretches (5s contract/5s relax) gradually progressing into more ROM x 1 bout each direction bilaterally; Practiced seated SNAG cervical rotation stretch; HEP updated and reviewed with patient;   Mechanical Traction After, and separate from, manual therapy performed mechanical cervical traction with pt in supine, 20-30 degree pull angle with Saunders unit. Utilized 20# x 10 minutes with therapist constantly monitoring patient. Pt denies any pain during or after traction;   PATIENT EDUCATION:   Education details: Plan of care and HEP modifications Person educated: Patient Education method: Explanation and handout Education comprehension: verbalized understanding and returned demonstration   HOME EXERCISE PROGRAM: Access Code: [email or web address] URL: [email or web address] Date: 07/26/2022 Prepared by: Roxana Hires  Exercises - Seated Cervical Retraction  - 1-2 x daily - 7 x weekly - 2 sets - 10 reps - 3s hold - Seated Scapular Retraction  - 1-2 x daily - 7 x weekly - 2 sets - 10 reps - 3s hold - Seated Cervical Sidebending Stretch  - 4-6 x daily - 7 x weekly - 2-3 reps - 45-60s hold - Seated Assisted Cervical Rotation with Towel  - 4-6 x daily - 7 x weekly - 2-3 reps - 45-60s hold   ASSESSMENT:  CLINICAL IMPRESSION: Progressed cervical range of motion today with manual techniques, dry needling, and mechanical traction. Pt had a good response after the evaluation without any significant soreness or spasm. HEP updated to progress hold times for stretches and added SNAG cervical rotation stretch. Pt encouraged to follow-up as scheduled. He will benefit from skilled PT to address impairments in cervical range of motion and pain in order to improve function at home, work, and with leisure activities.  REHAB POTENTIAL: Fair    CLINICAL DECISION MAKING: Evolving/moderate complexity  EVALUATION COMPLEXITY: Moderate   GOALS: Goals reviewed with patient? Yes  SHORT TERM GOALS: Target date: 08/16/2022   Pt will be independent with HEP to improve strength and decrease neck pain to improve pain-free function at home and work. Baseline:  Goal status: INITIAL  LONG TERM GOALS: Target date: 09/13/2022   Pt will increase FOTO to at least 61 to demonstrate significant improvement in function at home and work related to neck pain  Baseline: 07/19/22: 49 Goal status:  INITIAL  2.  Pt will decrease worst neck pain by at least 2 points on the NPRS in order to demonstrate  clinically significant reduction in neck pain. Baseline: 07/19/22: 10/10 Goal status: INITIAL  3.  Pt will decrease NDI score by at least 19% in order demonstrate clinically significant reduction in neck pain/disability.       Baseline: 07/19/22: To be completed; 07/26/22: 10%; Goal status: DISCONTINUED  4.  Pt will increase cervical extension, bilateral rotation, and bilateral lateral flexion by at least 10 degrees each in order to improve his ability to work and drive with less pain Baseline: 07/19/22: extension: 8, lateral flexion (R/L): 10/6, rotation (R/L): 15/16; Goal status: INITIAL   PLAN: PT FREQUENCY: 1-2x/week  PT DURATION: 8 weeks  PLANNED INTERVENTIONS: Therapeutic exercises, Therapeutic activity, Neuromuscular re-education, Balance training, Gait training, Patient/Family education, Joint manipulation, Joint mobilization, Vestibular training, Canalith repositioning, Dry Needling, Electrical stimulation, Spinal manipulation, Spinal mobilization, Cryotherapy, Moist heat, Taping, Traction, Ultrasound, Ionotophoresis 4mg /ml Dexamethasone, and Manual therapy  PLAN FOR NEXT SESSION:  Sensation and reflex testing, Hoffman sign, assess response to mechanical cervical traction, review and modify HEP as necessary  Lyndel Safe Renly Roots PT, DPT, GCS  Wyat Infinger 07/29/2022, 3:41 PM

## 2022-07-28 MED ORDER — APIXABAN 5 MG PO TABS: 5.0000 mg | ORAL_TABLET | Freq: Two times a day (BID) | ORAL | 2 refills | Status: DC

## 2022-07-28 NOTE — Telephone Encounter (Signed)
Samples placed up front for pick up.  Eliquis 5 mg Qty: 2 boxes Lot # [id number] Exp: 6/25

## 2022-07-28 NOTE — Addendum Note (Signed)
Addended by: Bernestine Amass on: 07/28/2022 02:58 PM   Modules accepted: Orders

## 2022-07-31 NOTE — Therapy (Signed)
OUTPATIENT PHYSICAL THERAPY NECK TREATMENT  Patient Name: Jose Garcia MRN: [id number] DOB:1961/02/13, 62 y.o., male Today's Date: 08/03/2022   PT End of Session - 08/02/22 1057     Visit Number 3    Number of Visits 17    Date for PT Re-Evaluation 09/13/22    Authorization Type eval: 07/19/22    PT Start Time 1105    PT Stop Time 1155    PT Time Calculation (min) 50 min    Activity Tolerance Patient tolerated treatment well    Behavior During Therapy [facility] for tasks assessed/performed            Past Medical History:  Diagnosis Date   Atrial fibrillation (HCC)    Cardiomyopathy    Resolved 05/03/09   Hyperlipidemia    Hypertension 03/03/96   Sleep apnea    Past Surgical History:  Procedure Laterality Date   CARDIOVERSION     Patient Active Problem List   Diagnosis Date Noted   OSA on CPAP 10/08/2019   Post-nasal drainage 10/08/2019   Morbid obesity (HCC) 05/05/2015   ED (erectile dysfunction) 05/05/2015   Cardiomyopathy (HCC) 11/06/2014   EDEMA 01/07/2010   Paroxysmal atrial fibrillation (HCC) 01/22/2009   HYPERCHOLESTEROLEMIA 12/31/2007   Essential hypertension 12/31/2007   PCP: Smitty Cords, DO  REFERRING PROVIDER: Ronne Binning, NP  REFERRING DIAGNOSIS: Cervicalgia  THERAPY DIAG: Cervicalgia  RATIONALE FOR EVALUATION AND TREATMENT: Rehabilitation  ONSET DATE: Approximately 10-12 years ago  FOLLOW UP APPT WITH PROVIDER: Yes, plans to see MD for a physical this year  FROM INITIAL EVALUATION SUBJECTIVE:                                                                                                                                                                                         Chief Complaint: Neck pain and limited motion;  Pertinent History Pt referred to physical therapy for limited neck mobility and pain. Mobility deficits have progressed for the last 10-12 years. He had seen chiropractor in the past but is not seeing one  currently. No prior history of head or neck surgery. He has had both a cervical CT and MRI which showed multilevel degenerative changes of the cervical spine. This included OPLL with resulting severe spinal canal stenosis and cord compression at C3-C4 and C4-C5 without underlying cord signal abnormality. Foraminal narrowing, severe on the right at C3-C4 and C4-C5 and moderate at other levels as above. Large, bulky osteophytes along the anterior aspect of the upper cervical spine extending from approximately C1-C5. There is mass effect on the posterior aspect of the oropharynx. Pt is having difficulty swallowing and reports  that his esophagus feels "closed up." Pt has consulted with neurosurgery but per pt report none of the surgeons have encouraged him to proceed with surgery given the high risk of worsening his symptoms.  MRI CERVICAL SPINE WITHOUT CONTRAST (01/20/22) FINDINGS:  Alignment: Normal alignment  Bone marrow Signal: no suspicious lesions  Spinal cord: Normal signal. There is complete effacement of the anterior  and posterior CSF space at the C3-C4 (9:24) and C4-C5 (9:18) levels  resulting in cord compression. There is no underlying cord signal  abnormality.  Flowing anterior osteophytes consistent with DISH. OPLL.  Paraspinal Soft Tissues: Mild prevertebral edema.  Occiput-C1: Mild degenerative changes.  Atlanto-dental interval: Degenerative narrowing  C1-2 lateral masses: no significant degenerative change  C2-C3: Disc osteophyte complex. Moderate facet arthrosis, right greater  than left. Mild spinal canal stenosis. No foraminal narrowing.  C3-C4: Disc osteophyte complex. Severe spinal canal stenosis and cord  compression. Severe right and moderate left foraminal narrowing.  C4-C5: OPLL. Severe spinal canal stenosis and cord compression. Severe  right and mild left foraminal narrowing.  C5-C6: OPLL. Moderate spinal canal stenosis. Mild bilateral foraminal  narrowing.  C6-C7: OPLL.  Moderate spinal canal stenosis. No foraminal narrowing.  C7-T1: no significant stenosis   IMPRESSION:  1. Multilevel degenerative changes of the cervical spine. OPLL with  resulting severe spinal canal stenosis and cord compression at C3-C4 and  C4-C5. No underlying cord signal abnormality.  2.  Foraminal narrowing, severe on the right at C3-C4 and C4-C5 and  moderate at other levels as above.   Procedure: CT CERVICAL SPINE WITHOUT CONTRAST (01/20/22) CERVICAL SPINE FINDINGS:  Alignment: Normal.  Occipital Condyles: Intact.  Bones: No fractures or facet dislocations.  Intervertebral Discs: Normal.  Prevertebral soft tissues: No prevertebral soft tissue swelling.  Regional Soft Tissues: The regional soft tissues are unremarkable.  Lung Apices: The partially visualized lung apices are clear.  Skull Base: The partially imaged skull base is unremarkable.  Degenerative changes:  Large, bulky osteophytes along the anterior aspect of the upper cervical  spine extending from approximately C1-C5. There is mass effect on the  posterior aspect of the oropharynx. There is cervical spine DISH and OPLL  extending from C4-T1.  C1-C2: Extensive heterotopic osseous formation about the atlantodens  interval.  C2-C3: Uncovertebral degenerative changes. No high-grade spinal canal  stenosis.  C3-C4: Uncovertebral and facet degenerative changes. OPLL results in severe  canal stenosis.  C4-C5: Uncovertebral and facet degenerative changes as well as OPLL,  resulting in severe canal stenosis.  C5-C6: Facet and uncovertebral degenerative changes. OPLL.  C6-C7: Facet and uncovertebral degenerative change. OPLL. Mild canal  stenosis.  C7-T1: No high-grade spinal canal or neuroforaminal stenosis.   IMPRESSION:  Extensive degenerative changes of the cervical spine with multilevel DISH  and OPLL. There is severe spinal canal stenosis at C3-C4 and C4-C5 better  evaluated on same day MRI.   Pain:  Pain  Intensity: Present: 0/10, Best: 0/10, Worst: 10/10 Pain location: R upper trap extending to the inside of the R shoulder blade, tightness on the left side of neck; Pain Quality: general tightness and muscle strain Radiating: Yes, Pain occasionally radiates from the R side of his neck to the top of his R shoulder; Numbness/Tingling: Yes, R side of neck and top of R shoulder, 2x/month numbness in R hand Focal Weakness: No Aggravating factors: Getting into/out of cars due to laterally flexing and rotating his head (takes a day or two to recover), most activities increase his pain, no real  change with TENS Relieving factors: rest after strain, Tylenol/ibuprofen, no significant difference with muscle relaxers, heat, ice helps when pt has significant muscle spasms, massage (self-massage and pt has had professional massage as well), minimal benefit from PT multiple years ago.  24-hour pain behavior: pain is worse at the end of the day History of prior neck injury, pain, surgery, or therapy: No, no surgery or notable trauma to neck Falls: Has patient fallen in last 6 months? 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  Recent memory is intact.  Remote memory is intact.  Attention  span and concentration are intact.  Expressive speech is intact.  Patient's fund of knowledge is within normal limits for educational level.    Gross Musculoskeletal Assessment Tremor: None Bulk: Normal Tone: Normal  Gait Deferred  Posture Upper thoracic kyphosis noted with forward head and rounded shoulders. Unable to correct with cues. Limited spontaneous cervical motion noted  AROM AROM (Normal range in degrees) AROM 07/19/2022  Cervical  Flexion (50) 25  Extension (80) 8  Right lateral flexion (45) 10  Left lateral flexion (45) 6  Right rotation (85) 15  Left rotation (85) 16  (* = pain; Blank rows = not tested)  MMT MMT (out of 5) Right 07/19/2022 Left 07/19/2022  Cervical (isometric)  Flexion WNL  Extension WNL  Lateral Flexion WNL WNL  Rotation WNL WNL      Shoulder   Flexion 5 5  Extension    Abduction 5 5  Internal rotation    Horizontal abduction    Horizontal adduction    Lower Trapezius    Rhomboids        Elbow  Flexion 5 5  Extension 5 5  Pronation    Supination        Wrist  Flexion 5 5  Extension 5 5  Radial deviation    Ulnar deviation        MCP  Flexion 5 5  Extension 5 5  Abduction 5 5  Adduction 5 5  (* = pain; Blank rows = not tested)  Sensation Deferred  Reflexes Deferred  Palpation  Location LEFT  RIGHT           Suboccipitals 0 0  Cervical paraspinals 0 0  Upper Trapezius 0 0  Levator Scapulae 0 0  Rhomboid Major/Minor 0 0  (Blank rows = not tested) Graded on 0-4 scale (0 = no pain, 1 = pain, 2 = pain with wincing/grimacing/flinching, 3 = pain with withdrawal, 4 = unwilling to allow palpation), (Blank rows = not tested)  Repeated Movements Deferred   Passive Accessory Intervertebral Motion Deferred  SPECIAL TESTS Spurlings A (ipsilateral lateral flexion/axial compression): R: Negative L: Negative Spurlings B (ipsilateral lateral flexion/contralateral rotation/axial compression): R: Negative L:  Negative Distraction Test: Not examined  Hoffman Sign (cervical  cord compression): R: Not examined L: Not examined ULTT Median: R: Not examined L: Not examined ULTT Ulnar: R: Not examined L: Not examined ULTT Radial: R: Not examined L: Not examined   TODAY'S TREATMENT    SUBJECTIVE: Pt reports that he is doing well today. He denies any excessive soreness or increase in muscle spasms after the last therapy session. No pain upon arrival. He notices some improvement in his cervical range of motion the day of his therapy session with some possible carryover into the following day.  No specific questions or concerns.    PAIN: Denies   Trigger Point Dry Needling (TDN), unbilled Education performed with patient regarding potential benefit of TDN. Previously reviewed precautions and risks with patient. Pt provided verbal consent to treatment. In prone using clean technique TDN performed to bilateral upper traps with 4, 0.25 x 40 single needle placements (2 on each side) with local twitch response (LTR) during all placements (more vigorous laterally). Also performed 2, 0.25 x 40 needle placements to cervical multifidi at C3 (one on each side), 2, 0.25 x 40 needle placements to suboccipitals (one on each side), and 2, 0.25 x 40 single needle placements to splenius capitis/cervicis. Pistoning technique utilized.   Manual Therapy Hoffman negative bilaterally; Prone moist heat pack to cervical spine during interval history; Prone CPA C2-T3, grade II-III, 20s/bout x 1 bout/level; Prone UPA C2-C5, grade II-III, 20s/bout x 1 bout/level bilaterally; Supine MET cervical lateral flexion and rotation stretches (5s contract/5s relax) gradually progressing into more ROM x 1 bout each direction bilaterally;   Mechanical Traction After, and separate from, manual therapy performed mechanical cervical traction with pt in supine, 20-30 degree pull angle with Saunders unit. Utilized 25# x 10 minutes with therapist  constantly monitoring patient. Pt denies any pain during or after traction;   PATIENT EDUCATION:  Education details: Plan of care and HEP modifications Person educated: Patient Education method: Explanation and handout Education comprehension: verbalized understanding and returned demonstration   HOME EXERCISE PROGRAM: Access Code: [email or web address] URL: [email or web address] Date: 07/26/2022 Prepared by: Roxana Hires  Exercises - Seated Cervical Retraction  - 1-2 x daily - 7 x weekly - 2 sets - 10 reps - 3s hold - Seated Scapular Retraction  - 1-2 x daily - 7 x weekly - 2 sets - 10 reps - 3s hold - Seated Cervical Sidebending Stretch  - 4-6 x daily - 7 x weekly - 2-3 reps - 45-60s hold - Seated Assisted Cervical Rotation with Towel  - 4-6 x daily - 7 x weekly - 2-3 reps - 45-60s hold   ASSESSMENT:  CLINICAL IMPRESSION: Progressed cervical range of motion today with manual techniques, dry needling, and mechanical traction. Pt had a good response after the last therapy session without any significant soreness or spasm. No HEP updates on this date. Pt encouraged to follow-up as scheduled. He will benefit from skilled PT to address impairments in cervical range of motion and pain in order to improve function at home, work, and with leisure activities.  REHAB POTENTIAL: Fair    CLINICAL DECISION MAKING: Evolving/moderate complexity  EVALUATION COMPLEXITY: Moderate   GOALS: Goals reviewed with patient? Yes  SHORT TERM GOALS: Target date: 08/16/2022   Pt will be independent with HEP to improve strength and decrease neck pain to improve pain-free function at home and work. Baseline:  Goal status: INITIAL  LONG TERM GOALS: Target date: 09/13/2022   Pt will increase FOTO to at least 61 to demonstrate significant improvement in  function at home and work related to neck pain  Baseline: 07/19/22: 49 Goal status: INITIAL  2.  Pt will decrease worst neck pain by at least 2  points on the NPRS in order to demonstrate clinically significant reduction in neck pain. Baseline: 07/19/22: 10/10 Goal status: INITIAL  3.  Pt will decrease NDI score by at least 19% in order demonstrate clinically significant reduction in neck pain/disability.       Baseline: 07/19/22: To be completed; 07/26/22: 10%; Goal status: DISCONTINUED  4.  Pt will increase cervical extension, bilateral rotation, and bilateral lateral flexion by at least 10 degrees each in order to improve his ability to work and drive with less pain Baseline: 07/19/22: extension: 8, lateral flexion (R/L): 10/6, rotation (R/L): 15/16; Goal status: INITIAL   PLAN: PT FREQUENCY: 1-2x/week  PT DURATION: 8 weeks  PLANNED INTERVENTIONS: Therapeutic exercises, Therapeutic activity, Neuromuscular re-education, Balance training, Gait training, Patient/Family education, Joint manipulation, Joint mobilization, Vestibular training, Canalith repositioning, Dry Needling, Electrical stimulation, Spinal manipulation, Spinal mobilization, Cryotherapy, Moist heat, Taping, Traction, Ultrasound, Ionotophoresis 4mg /ml Dexamethasone, and Manual therapy  PLAN FOR NEXT SESSION:  Sensation and reflex testing, review and modify HEP as necessary  Lyndel Safe Franck Vinal PT, DPT, GCS  Ghazi Rumpf 08/03/2022, 2:56 PM

## 2022-08-02 ENCOUNTER — Ambulatory Visit: Payer: BC Managed Care – PPO

## 2022-08-02 DIAGNOSIS — M542 Cervicalgia: Secondary | ICD-10-CM

## 2022-08-03 ENCOUNTER — Telehealth: Payer: Self-pay

## 2022-08-03 NOTE — Telephone Encounter (Signed)
-----  Message from Jerome, RN sent at 05/31/2022  9:13 AM EST ----- Patient needs to start Eliquis 5 mg BID on 2/5 (4 weeks prior to ablation)

## 2022-08-03 NOTE — Telephone Encounter (Signed)
Called pt to remind him to start his Eliquis on 2/5. He stated that he has is sitting out so he doesn't forget.

## 2022-08-05 NOTE — Therapy (Signed)
OUTPATIENT PHYSICAL THERAPY NECK TREATMENT  Patient Name: Jose Garcia MRN: [id number] DOB:[birth year], 62 y.o., male Today's Date: 08/07/2022   PT End of Session - 08/07/22 1408     Visit Number 4    Number of Visits 17    Date for PT Re-Evaluation 09/13/22    Authorization Type eval: 07/19/22    PT Start Time 1355    PT Stop Time 1440    PT Time Calculation (min) 45 min    Activity Tolerance Patient tolerated treatment well    Behavior During Therapy [facility] for tasks assessed/performed            Past Medical History:  Diagnosis Date   Atrial fibrillation ([place])    Cardiomyopathy    Resolved 05/03/09   Hyperlipidemia    Hypertension 03/03/96   Sleep apnea    Past Surgical History:  Procedure Laterality Date   CARDIOVERSION     Patient Active Problem List   Diagnosis Date Noted   OSA on CPAP 10/08/2019   Post-nasal drainage 10/08/2019   Morbid obesity ([place]) 05/05/2015   ED (erectile dysfunction) 05/05/2015   Cardiomyopathy ([place]) 11/06/2014   EDEMA 01/07/2010   Paroxysmal atrial fibrillation ([place]) 01/22/2009   HYPERCHOLESTEROLEMIA 12/31/2007   Essential hypertension 12/31/2007   PCP: Olin Hauser, DO  REFERRING PROVIDER: Lanice Schwab, NP  REFERRING DIAGNOSIS: Cervicalgia  THERAPY DIAG: Cervicalgia  RATIONALE FOR EVALUATION AND TREATMENT: Rehabilitation  ONSET DATE: Approximately 10-12 years ago  FOLLOW UP APPT WITH PROVIDER: Yes, plans to see MD for a physical this year  FROM INITIAL EVALUATION SUBJECTIVE:                                                                                                                                                                                         Chief Complaint: Neck pain and limited motion;  Pertinent History Pt referred to physical therapy for limited neck mobility and pain. Mobility deficits have progressed for the last 10-12 years. He had seen chiropractor in the past but is not seeing one  currently. No prior history of head or neck surgery. He has had both a cervical CT and MRI which showed multilevel degenerative changes of the cervical spine. This included OPLL with resulting severe spinal canal stenosis and cord compression at C3-C4 and C4-C5 without underlying cord signal abnormality. Foraminal narrowing, severe on the right at C3-C4 and C4-C5 and moderate at other levels as above. Large, bulky osteophytes along the anterior aspect of the upper cervical spine extending from approximately C1-C5. There is mass effect on the posterior aspect of the oropharynx. Pt is having difficulty swallowing and reports  that his esophagus feels "closed up." Pt has consulted with neurosurgery but per pt report none of the surgeons have encouraged him to proceed with surgery given the high risk of worsening his symptoms.  MRI CERVICAL SPINE WITHOUT CONTRAST (01/20/22) FINDINGS:  Alignment: Normal alignment  Bone marrow Signal: no suspicious lesions  Spinal cord: Normal signal. There is complete effacement of the anterior  and posterior CSF space at the C3-C4 (9:24) and C4-C5 (9:18) levels  resulting in cord compression. There is no underlying cord signal  abnormality.  Flowing anterior osteophytes consistent with DISH. OPLL.  Paraspinal Soft Tissues: Mild prevertebral edema.  Occiput-C1: Mild degenerative changes.  [place]-dental interval: Degenerative narrowing  C1-2 lateral masses: no significant degenerative change  C2-C3: Disc osteophyte complex. Moderate facet arthrosis, right greater  than left. Mild spinal canal stenosis. No foraminal narrowing.  C3-C4: Disc osteophyte complex. Severe spinal canal stenosis and cord  compression. Severe right and moderate left foraminal narrowing.  C4-C5: OPLL. Severe spinal canal stenosis and cord compression. Severe  right and mild left foraminal narrowing.  C5-C6: OPLL. Moderate spinal canal stenosis. Mild bilateral foraminal  narrowing.  C6-C7: OPLL.  Moderate spinal canal stenosis. No foraminal narrowing.  C7-T1: no significant stenosis   IMPRESSION:  1. Multilevel degenerative changes of the cervical spine. OPLL with  resulting severe spinal canal stenosis and cord compression at C3-C4 and  C4-C5. No underlying cord signal abnormality.  2.  Foraminal narrowing, severe on the right at C3-C4 and C4-C5 and  moderate at other levels as above.   Procedure: CT CERVICAL SPINE WITHOUT CONTRAST (01/20/22) CERVICAL SPINE FINDINGS:  Alignment: Normal.  Occipital Condyles: Intact.  Bones: No fractures or facet dislocations.  Intervertebral Discs: Normal.  Prevertebral soft tissues: No prevertebral soft tissue swelling.  Regional Soft Tissues: The regional soft tissues are unremarkable.  Lung Apices: The partially visualized lung apices are clear.  Skull Base: The partially imaged skull base is unremarkable.  Degenerative changes:  Large, bulky osteophytes along the anterior aspect of the upper cervical  spine extending from approximately C1-C5. There is mass effect on the  posterior aspect of the oropharynx. There is cervical spine DISH and OPLL  extending from C4-T1.  C1-C2: Extensive heterotopic osseous formation about the atlantodens  interval.  C2-C3: Uncovertebral degenerative changes. No high-grade spinal canal  stenosis.  C3-C4: Uncovertebral and facet degenerative changes. OPLL results in severe  canal stenosis.  C4-C5: Uncovertebral and facet degenerative changes as well as OPLL,  resulting in severe canal stenosis.  C5-C6: Facet and uncovertebral degenerative changes. OPLL.  C6-C7: Facet and uncovertebral degenerative change. OPLL. Mild canal  stenosis.  C7-T1: No high-grade spinal canal or neuroforaminal stenosis.   IMPRESSION:  Extensive degenerative changes of the cervical spine with multilevel DISH  and OPLL. There is severe spinal canal stenosis at C3-C4 and C4-C5 better  evaluated on same day MRI.   Pain:  Pain  Intensity: Present: 0/10, Best: 0/10, Worst: 10/10 Pain location: R upper trap extending to the inside of the R shoulder blade, tightness on the left side of neck; Pain Quality: general tightness and muscle strain Radiating: Yes, Pain occasionally radiates from the R side of his neck to the top of his R shoulder; Numbness/Tingling: Yes, R side of neck and top of R shoulder, 2x/month numbness in R hand Focal Weakness: No Aggravating factors: Getting into/out of cars due to laterally flexing and rotating his head (takes a day or two to recover), most activities increase his pain, no real  change with TENS Relieving factors: rest after strain, Tylenol/ibuprofen, no significant difference with muscle relaxers, heat, ice helps when pt has significant muscle spasms, massage (self-massage and pt has had professional massage as well), minimal benefit from PT multiple years ago.  24-hour pain behavior: pain is worse at the end of the day History of prior neck injury, pain, surgery, or therapy: No, no surgery or notable trauma to neck Falls: Has patient fallen in last 6 months? 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  Recent memory is intact.  Remote memory is intact.  Attention  span and concentration are intact.  Expressive speech is intact.  Patient's fund of knowledge is within normal limits for educational level.    Gross Musculoskeletal Assessment Tremor: None Bulk: Normal Tone: Normal  Gait Deferred  Posture Upper thoracic kyphosis noted with forward head and rounded shoulders. Unable to correct with cues. Limited spontaneous cervical motion noted  AROM AROM (Normal range in degrees) AROM 07/19/2022  Cervical  Flexion (50) 25  Extension (80) 8  Right lateral flexion (45) 10  Left lateral flexion (45) 6  Right rotation (85) 15  Left rotation (85) 16  (* = pain; Blank rows = not tested)  MMT MMT (out of 5) Right 07/19/2022 Left 07/19/2022  Cervical (isometric)  Flexion WNL  Extension WNL  Lateral Flexion WNL WNL  Rotation WNL WNL      Shoulder   Flexion 5 5  Extension    Abduction 5 5  Internal rotation    Horizontal abduction    Horizontal adduction    Lower Trapezius    Rhomboids        Elbow  Flexion 5 5  Extension 5 5  Pronation    Supination        Wrist  Flexion 5 5  Extension 5 5  Radial deviation    Ulnar deviation        MCP  Flexion 5 5  Extension 5 5  Abduction 5 5  Adduction 5 5  (* = pain; Blank rows = not tested)  Sensation Deferred  Reflexes Deferred  Palpation  Location LEFT  RIGHT           Suboccipitals 0 0  Cervical paraspinals 0 0  Upper Trapezius 0 0  Levator Scapulae 0 0  Rhomboid Major/Minor 0 0  (Blank rows = not tested) Graded on 0-4 scale (0 = no pain, 1 = pain, 2 = pain with wincing/grimacing/flinching, 3 = pain with withdrawal, 4 = unwilling to allow palpation), (Blank rows = not tested)  Repeated Movements Deferred   Passive Accessory Intervertebral Motion Deferred  SPECIAL TESTS Spurlings A (ipsilateral lateral flexion/axial compression): R: Negative L: Negative Spurlings B (ipsilateral lateral flexion/contralateral rotation/axial compression): R: Negative L:  Negative Distraction Test: Not examined  Hoffman Sign (cervical  cord compression): R: Not examined L: Not examined ULTT Median: R: Not examined L: Not examined ULTT Ulnar: R: Not examined L: Not examined ULTT Radial: R: Not examined L: Not examined   TODAY'S TREATMENT    SUBJECTIVE: Pt reports that he is doing well today. He had some additional muscle soreness after the last therapy session but still has not had a recurrence of the spasms. No pain upon arrival.  No specific questions or concerns.    PAIN: Denies   Trigger Point Dry Needling (TDN), unbilled Education performed with patient regarding potential benefit of TDN. Previously reviewed precautions and risks with patient. Pt provided verbal consent to treatment. In prone using clean technique TDN performed to bilateral upper traps with 4, 0.25 x 40 single needle placements (2 on each side) with local twitch response (LTR) during all placements (more vigorous laterally). Also performed 2, 0.25 x 40 needle placements to cervical multifidi at C3 (one on each side) and 2, 0.30 x 60 single needle placements to splenius capitis/cervicis. Pistoning technique utilized.   Manual Therapy Supine moist heat pack to cervical spine during interval history at start of session; Prone CPA C2-T3, grade II-III, 20s/bout x 1 bout/level; Supine MET cervical lateral flexion and rotation stretches (5s contract/5s relax) gradually progressing into more ROM x 1 bout each direction bilaterally;   Mechanical Traction Before, and separate from, manual therapy, performed mechanical cervical traction with pt in supine, 20-30 degree pull angle with Saunders unit. Utilized 30# x 10 minutes with therapist constantly monitoring patient. Pt denies any pain during or after traction;   PATIENT EDUCATION:  Education details: Plan of care Person educated: Patient Education method: Explanation Education comprehension: verbalized understanding   HOME EXERCISE  PROGRAM: Access Code: [email or web address] URL: [email or web address] Date: 07/26/2022 Prepared by: Roxana Hires  Exercises - Seated Cervical Retraction  - 1-2 x daily - 7 x weekly - 2 sets - 10 reps - 3s hold - Seated Scapular Retraction  - 1-2 x daily - 7 x weekly - 2 sets - 10 reps - 3s hold - Seated Cervical Sidebending Stretch  - 4-6 x daily - 7 x weekly - 2-3 reps - 45-60s hold - Seated Assisted Cervical Rotation with Towel  - 4-6 x daily - 7 x weekly - 2-3 reps - 45-60s hold   ASSESSMENT:  CLINICAL IMPRESSION: Progressed cervical range of motion today with manual techniques, dry needling, and mechanical traction. Pt had a good response today with slight increase in motion at the end of the session. No HEP updates on this date. Pt encouraged to follow-up as scheduled. He will benefit from skilled PT to address impairments in cervical range of motion and pain in order to improve function at home, work, and with leisure activities.  REHAB POTENTIAL: Fair    CLINICAL DECISION MAKING: Evolving/moderate complexity  EVALUATION COMPLEXITY: Moderate   GOALS: Goals reviewed with patient? Yes  SHORT TERM GOALS: Target date: 08/16/2022   Pt will be independent with HEP to improve strength and decrease neck pain to improve pain-free function at home and work. Baseline:  Goal status: INITIAL  LONG TERM GOALS: Target date: 09/13/2022   Pt will increase FOTO to at least 61 to demonstrate significant improvement in function at home and work related to neck pain  Baseline: 07/19/22: 49 Goal status: INITIAL  2.  Pt will decrease worst neck pain by at least 2 points on the NPRS in order to demonstrate clinically significant reduction in neck pain. Baseline: 07/19/22: 10/10  Goal status: INITIAL  3.  Pt will decrease NDI score by at least 19% in order demonstrate clinically significant reduction in neck pain/disability.       Baseline: 07/19/22: To be completed; 07/26/22: 10%; Goal  status: DISCONTINUED  4.  Pt will increase cervical extension, bilateral rotation, and bilateral lateral flexion by at least 10 degrees each in order to improve his ability to work and drive with less pain Baseline: 07/19/22: extension: 8, lateral flexion (R/L): 10/6, rotation (R/L): 15/16; Goal status: INITIAL   PLAN: PT FREQUENCY: 1-2x/week  PT DURATION: 8 weeks  PLANNED INTERVENTIONS: Therapeutic exercises, Therapeutic activity, Neuromuscular re-education, Balance training, Gait training, Patient/Family education, Joint manipulation, Joint mobilization, Vestibular training, Canalith repositioning, Dry Needling, Electrical stimulation, Spinal manipulation, Spinal mobilization, Cryotherapy, Moist heat, Taping, Traction, Ultrasound, Ionotophoresis 4mg /ml Dexamethasone, and Manual therapy  PLAN FOR NEXT SESSION:  Sensation and reflex testing, review and modify HEP as necessary  Lyndel Safe Anthoni Geerts PT, DPT, GCS  Shahrukh Pasch 08/07/2022, 9:42 PM

## 2022-08-07 ENCOUNTER — Ambulatory Visit: Payer: BC Managed Care – PPO | Attending: Family

## 2022-08-07 DIAGNOSIS — M542 Cervicalgia: Secondary | ICD-10-CM | POA: Insufficient documentation

## 2022-08-09 ENCOUNTER — Ambulatory Visit: Payer: BC Managed Care – PPO

## 2022-08-09 DIAGNOSIS — M542 Cervicalgia: Secondary | ICD-10-CM

## 2022-08-09 NOTE — Therapy (Signed)
OUTPATIENT PHYSICAL THERAPY NECK TREATMENT  Patient Name: Jose Garcia MRN: [id number] DOB:[birth year], 62 y.o., male Today's Date: 08/09/2022   PT End of Session - 08/09/22 1016     Visit Number 5    Number of Visits 17    Date for PT Re-Evaluation 09/13/22    Authorization Type eval: 07/19/22    PT Start Time 1015    PT Stop Time 1100    PT Time Calculation (min) 45 min    Activity Tolerance Patient tolerated treatment well    Behavior During Therapy [facility] for tasks assessed/performed            Past Medical History:  Diagnosis Date   Atrial fibrillation ([place])    Cardiomyopathy    Resolved 05/03/09   Hyperlipidemia    Hypertension 03/03/96   Sleep apnea    Past Surgical History:  Procedure Laterality Date   CARDIOVERSION     Patient Active Problem List   Diagnosis Date Noted   OSA on CPAP 10/08/2019   Post-nasal drainage 10/08/2019   Morbid obesity ([place]) 05/05/2015   ED (erectile dysfunction) 05/05/2015   Cardiomyopathy ([place]) 11/06/2014   EDEMA 01/07/2010   Paroxysmal atrial fibrillation ([place]) 01/22/2009   HYPERCHOLESTEROLEMIA 12/31/2007   Essential hypertension 12/31/2007   PCP: Olin Hauser, DO  REFERRING PROVIDER: Lanice Schwab, NP  REFERRING DIAGNOSIS: Cervicalgia  THERAPY DIAG: Cervicalgia  RATIONALE FOR EVALUATION AND TREATMENT: Rehabilitation  ONSET DATE: Approximately 10-12 years ago  FOLLOW UP APPT WITH PROVIDER: Yes, plans to see MD for a physical this year  FROM INITIAL EVALUATION SUBJECTIVE:                                                                                                                                                                                         Chief Complaint: Neck pain and limited motion;  Pertinent History Pt referred to physical therapy for limited neck mobility and pain. Mobility deficits have progressed for the last 10-12 years. He had seen chiropractor in the past but is not seeing one  currently. No prior history of head or neck surgery. He has had both a cervical CT and MRI which showed multilevel degenerative changes of the cervical spine. This included OPLL with resulting severe spinal canal stenosis and cord compression at C3-C4 and C4-C5 without underlying cord signal abnormality. Foraminal narrowing, severe on the right at C3-C4 and C4-C5 and moderate at other levels as above. Large, bulky osteophytes along the anterior aspect of the upper cervical spine extending from approximately C1-C5. There is mass effect on the posterior aspect of the oropharynx. Pt is having difficulty swallowing and reports  that his esophagus feels "closed up." Pt has consulted with neurosurgery but per pt report none of the surgeons have encouraged him to proceed with surgery given the high risk of worsening his symptoms.  MRI CERVICAL SPINE WITHOUT CONTRAST (01/20/22) FINDINGS:  Alignment: Normal alignment  Bone marrow Signal: no suspicious lesions  Spinal cord: Normal signal. There is complete effacement of the anterior  and posterior CSF space at the C3-C4 (9:24) and C4-C5 (9:18) levels  resulting in cord compression. There is no underlying cord signal  abnormality.  Flowing anterior osteophytes consistent with DISH. OPLL.  Paraspinal Soft Tissues: Mild prevertebral edema.  Occiput-C1: Mild degenerative changes.  [place]-dental interval: Degenerative narrowing  C1-2 lateral masses: no significant degenerative change  C2-C3: Disc osteophyte complex. Moderate facet arthrosis, right greater  than left. Mild spinal canal stenosis. No foraminal narrowing.  C3-C4: Disc osteophyte complex. Severe spinal canal stenosis and cord  compression. Severe right and moderate left foraminal narrowing.  C4-C5: OPLL. Severe spinal canal stenosis and cord compression. Severe  right and mild left foraminal narrowing.  C5-C6: OPLL. Moderate spinal canal stenosis. Mild bilateral foraminal  narrowing.  C6-C7: OPLL.  Moderate spinal canal stenosis. No foraminal narrowing.  C7-T1: no significant stenosis   IMPRESSION:  1. Multilevel degenerative changes of the cervical spine. OPLL with  resulting severe spinal canal stenosis and cord compression at C3-C4 and  C4-C5. No underlying cord signal abnormality.  2.  Foraminal narrowing, severe on the right at C3-C4 and C4-C5 and  moderate at other levels as above.   Procedure: CT CERVICAL SPINE WITHOUT CONTRAST (01/20/22) CERVICAL SPINE FINDINGS:  Alignment: Normal.  Occipital Condyles: Intact.  Bones: No fractures or facet dislocations.  Intervertebral Discs: Normal.  Prevertebral soft tissues: No prevertebral soft tissue swelling.  Regional Soft Tissues: The regional soft tissues are unremarkable.  Lung Apices: The partially visualized lung apices are clear.  Skull Base: The partially imaged skull base is unremarkable.  Degenerative changes:  Large, bulky osteophytes along the anterior aspect of the upper cervical  spine extending from approximately C1-C5. There is mass effect on the  posterior aspect of the oropharynx. There is cervical spine DISH and OPLL  extending from C4-T1.  C1-C2: Extensive heterotopic osseous formation about the atlantodens  interval.  C2-C3: Uncovertebral degenerative changes. No high-grade spinal canal  stenosis.  C3-C4: Uncovertebral and facet degenerative changes. OPLL results in severe  canal stenosis.  C4-C5: Uncovertebral and facet degenerative changes as well as OPLL,  resulting in severe canal stenosis.  C5-C6: Facet and uncovertebral degenerative changes. OPLL.  C6-C7: Facet and uncovertebral degenerative change. OPLL. Mild canal  stenosis.  C7-T1: No high-grade spinal canal or neuroforaminal stenosis.   IMPRESSION:  Extensive degenerative changes of the cervical spine with multilevel DISH  and OPLL. There is severe spinal canal stenosis at C3-C4 and C4-C5 better  evaluated on same day MRI.   Pain:  Pain  Intensity: Present: 0/10, Best: 0/10, Worst: 10/10 Pain location: R upper trap extending to the inside of the R shoulder blade, tightness on the left side of neck; Pain Quality: general tightness and muscle strain Radiating: Yes, Pain occasionally radiates from the R side of his neck to the top of his R shoulder; Numbness/Tingling: Yes, R side of neck and top of R shoulder, 2x/month numbness in R hand Focal Weakness: No Aggravating factors: Getting into/out of cars due to laterally flexing and rotating his head (takes a day or two to recover), most activities increase his pain, no real  change with TENS Relieving factors: rest after strain, Tylenol/ibuprofen, no significant difference with muscle relaxers, heat, ice helps when pt has significant muscle spasms, massage (self-massage and pt has had professional massage as well), minimal benefit from PT multiple years ago.  24-hour pain behavior: pain is worse at the end of the day History of prior neck injury, pain, surgery, or therapy: No, no surgery or notable trauma to neck Falls: Has patient fallen in last 6 months? 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  Recent memory is intact.  Remote memory is intact.  Attention  span and concentration are intact.  Expressive speech is intact.  Patient's fund of knowledge is within normal limits for educational level.    Gross Musculoskeletal Assessment Tremor: None Bulk: Normal Tone: Normal  Gait Deferred  Posture Upper thoracic kyphosis noted with forward head and rounded shoulders. Unable to correct with cues. Limited spontaneous cervical motion noted  AROM AROM (Normal range in degrees) AROM 07/19/2022  Cervical  Flexion (50) 25  Extension (80) 8  Right lateral flexion (45) 10  Left lateral flexion (45) 6  Right rotation (85) 15  Left rotation (85) 16  (* = pain; Blank rows = not tested)  MMT MMT (out of 5) Right 07/19/2022 Left 07/19/2022  Cervical (isometric)  Flexion WNL  Extension WNL  Lateral Flexion WNL WNL  Rotation WNL WNL      Shoulder   Flexion 5 5  Extension    Abduction 5 5  Internal rotation    Horizontal abduction    Horizontal adduction    Lower Trapezius    Rhomboids        Elbow  Flexion 5 5  Extension 5 5  Pronation    Supination        Wrist  Flexion 5 5  Extension 5 5  Radial deviation    Ulnar deviation        MCP  Flexion 5 5  Extension 5 5  Abduction 5 5  Adduction 5 5  (* = pain; Blank rows = not tested)  Sensation Deferred  Reflexes Deferred  Palpation  Location LEFT  RIGHT           Suboccipitals 0 0  Cervical paraspinals 0 0  Upper Trapezius 0 0  Levator Scapulae 0 0  Rhomboid Major/Minor 0 0  (Blank rows = not tested) Graded on 0-4 scale (0 = no pain, 1 = pain, 2 = pain with wincing/grimacing/flinching, 3 = pain with withdrawal, 4 = unwilling to allow palpation), (Blank rows = not tested)  Repeated Movements Deferred   Passive Accessory Intervertebral Motion Deferred  SPECIAL TESTS Spurlings A (ipsilateral lateral flexion/axial compression): R: Negative L: Negative Spurlings B (ipsilateral lateral flexion/contralateral rotation/axial compression): R: Negative L:  Negative Distraction Test: Not examined  Hoffman Sign (cervical  cord compression): R: Not examined L: Not examined ULTT Median: R: Not examined L: Not examined ULTT Ulnar: R: Not examined L: Not examined ULTT Radial: R: Not examined L: Not examined   TODAY'S TREATMENT    SUBJECTIVE: Pt reports that he is doing well today. No excessive muscle soreness after the last therapy session and no spasms. No pain upon arrival.  No specific questions or concerns.    PAIN: Denies   Trigger Point Dry Needling (TDN), unbilled Education performed with patient regarding potential benefit of TDN. Previously reviewed precautions and risks with patient. Pt provided verbal consent to treatment. In prone using clean technique TDN performed to bilateral upper traps with 4, 0.25 x 40 single needle placements (2 on each side) with local twitch response (LTR) during all placements. Pistoning technique utilized.   Manual Therapy Supine moist heat pack to cervical spine during interval history at start of session; Supine CPA C2-C4, grade II-III, 20s/bout x 1 bout/level; Supine MET cervical lateral flexion and rotation stretches (5s contract/5s relax) gradually progressing into more ROM x 1 bout each direction bilaterally; Obtained measurements for cervical rotation after manual therapy: R rotation: 33 degrees;  L rotation: 35 degrees   Mechanical Traction Before, and separate from, manual therapy, performed mechanical cervical traction with pt in supine, 20-30 degree pull angle with Saunders unit. Utilized 30# x 10 minutes with therapist constantly monitoring patient. Pt denies any pain during or after traction;   PATIENT EDUCATION:  Education details: Plan of care Person educated: Patient Education method: Explanation Education comprehension: verbalized understanding   HOME EXERCISE PROGRAM: Access Code: [email or web address] URL: [email or web address] Date: 07/26/2022 Prepared by: Roxana Hires  Exercises - Seated Cervical Retraction  - 1-2 x daily - 7 x weekly - 2 sets - 10 reps - 3s hold - Seated Scapular Retraction  - 1-2 x daily - 7 x weekly - 2 sets - 10 reps - 3s hold - Seated Cervical Sidebending Stretch  - 4-6 x daily - 7 x weekly - 2-3 reps - 45-60s hold - Seated Assisted Cervical Rotation with Towel  - 4-6 x daily - 7 x weekly - 2-3 reps - 45-60s hold   ASSESSMENT:  CLINICAL IMPRESSION: Progressed cervical range of motion today with manual techniques, dry needling, and mechanical traction. Measurements afterward demonstrate considerable improvement in cervical rotation compared to the initial evaluation. No HEP updates on this date however pt encouraged to continue with frequent stretching. Pt encouraged to follow-up as scheduled. He will benefit from skilled PT to address impairments in cervical range of motion and pain in order to improve function at home, work, and with leisure activities.  REHAB POTENTIAL: Fair    CLINICAL DECISION MAKING: Evolving/moderate complexity  EVALUATION COMPLEXITY: Moderate   GOALS: Goals reviewed with patient? Yes  SHORT TERM GOALS: Target date: 08/16/2022   Pt will be independent with HEP to improve strength and decrease neck pain to improve pain-free function at home and work. Baseline:  Goal status: INITIAL  LONG TERM GOALS: Target date: 09/13/2022   Pt will increase FOTO to at least 61 to demonstrate significant improvement in function at home and work related to neck pain  Baseline: 07/19/22: 49 Goal status: INITIAL  2.  Pt will decrease worst neck pain by at least 2 points on the NPRS in order to demonstrate clinically significant reduction in neck pain. Baseline: 07/19/22: 10/10; 08/09/22:  Goal status: INITIAL  3.  Pt will decrease NDI score by at least 19% in order  demonstrate clinically significant reduction in neck pain/disability.       Baseline: 07/19/22: To be completed; 07/26/22: 10%; Goal status:  DISCONTINUED  4.  Pt will increase cervical extension, bilateral rotation, and bilateral lateral flexion by at least 10 degrees each in order to improve his ability to work and drive with less pain Baseline: 07/19/22: extension: 8, lateral flexion (R/L): 10/6, rotation (R/L): 15/16; Goal status: INITIAL   PLAN: PT FREQUENCY: 1-2x/week  PT DURATION: 8 weeks  PLANNED INTERVENTIONS: Therapeutic exercises, Therapeutic activity, Neuromuscular re-education, Balance training, Gait training, Patient/Family education, Joint manipulation, Joint mobilization, Vestibular training, Canalith repositioning, Dry Needling, Electrical stimulation, Spinal manipulation, Spinal mobilization, Cryotherapy, Moist heat, Taping, Traction, Ultrasound, Ionotophoresis 4mg /ml Dexamethasone, and Manual therapy  PLAN FOR NEXT SESSION:  progress manual techniques for ROM, review and modify HEP as necessary  Lyndel Safe Sheril Hammond PT, DPT, GCS  Jose Garcia 08/09/2022, 12:11 PM

## 2022-08-10 NOTE — Therapy (Signed)
OUTPATIENT PHYSICAL THERAPY NECK TREATMENT  Patient Name: Jose Garcia MRN: [id number] DOB:Apr 24, 1961, 62 y.o., male Today's Date: 08/15/2022   PT End of Session - 08/14/22 1413     Visit Number 6    Number of Visits 17    Date for PT Re-Evaluation 09/13/22    Authorization Type eval: 07/19/22    PT Start Time 1400    PT Stop Time 1445    PT Time Calculation (min) 45 min    Activity Tolerance Patient tolerated treatment well    Behavior During Therapy [facility] for tasks assessed/performed            Past Medical History:  Diagnosis Date   Atrial fibrillation ([place])    Cardiomyopathy    Resolved 05/03/09   Hyperlipidemia    Hypertension 03/03/96   Sleep apnea    Past Surgical History:  Procedure Laterality Date   CARDIOVERSION     Patient Active Problem List   Diagnosis Date Noted   OSA on CPAP 10/08/2019   Post-nasal drainage 10/08/2019   Morbid obesity ([place]) 05/05/2015   ED (erectile dysfunction) 05/05/2015   Cardiomyopathy ([place]) 11/06/2014   EDEMA 01/07/2010   Paroxysmal atrial fibrillation ([place]) 01/22/2009   HYPERCHOLESTEROLEMIA 12/31/2007   Essential hypertension 12/31/2007   PCP: Olin Hauser, DO  REFERRING PROVIDER: Lanice Schwab, NP  REFERRING DIAGNOSIS: Cervicalgia  THERAPY DIAG: Cervicalgia  RATIONALE FOR EVALUATION AND TREATMENT: Rehabilitation  ONSET DATE: Approximately 10-12 years ago  FOLLOW UP APPT WITH PROVIDER: Yes, plans to see MD for a physical this year  FROM INITIAL EVALUATION SUBJECTIVE:                                                                                                                                                                                         Chief Complaint: Neck pain and limited motion;  Pertinent History Pt referred to physical therapy for limited neck mobility and pain. Mobility deficits have progressed for the last 10-12 years. He had seen chiropractor in the past but is not seeing one  currently. No prior history of head or neck surgery. He has had both a cervical CT and MRI which showed multilevel degenerative changes of the cervical spine. This included OPLL with resulting severe spinal canal stenosis and cord compression at C3-C4 and C4-C5 without underlying cord signal abnormality. Foraminal narrowing, severe on the right at C3-C4 and C4-C5 and moderate at other levels as above. Large, bulky osteophytes along the anterior aspect of the upper cervical spine extending from approximately C1-C5. There is mass effect on the posterior aspect of the oropharynx. Pt is having difficulty swallowing and reports  that his esophagus feels "closed up." Pt has consulted with neurosurgery but per pt report none of the surgeons have encouraged him to proceed with surgery given the high risk of worsening his symptoms.  MRI CERVICAL SPINE WITHOUT CONTRAST (01/20/22) FINDINGS:  Alignment: Normal alignment  Bone marrow Signal: no suspicious lesions  Spinal cord: Normal signal. There is complete effacement of the anterior  and posterior CSF space at the C3-C4 (9:24) and C4-C5 (9:18) levels  resulting in cord compression. There is no underlying cord signal  abnormality.  Flowing anterior osteophytes consistent with DISH. OPLL.  Paraspinal Soft Tissues: Mild prevertebral edema.  Occiput-C1: Mild degenerative changes.  [place]-dental interval: Degenerative narrowing  C1-2 lateral masses: no significant degenerative change  C2-C3: Disc osteophyte complex. Moderate facet arthrosis, right greater  than left. Mild spinal canal stenosis. No foraminal narrowing.  C3-C4: Disc osteophyte complex. Severe spinal canal stenosis and cord  compression. Severe right and moderate left foraminal narrowing.  C4-C5: OPLL. Severe spinal canal stenosis and cord compression. Severe  right and mild left foraminal narrowing.  C5-C6: OPLL. Moderate spinal canal stenosis. Mild bilateral foraminal  narrowing.  C6-C7: OPLL.  Moderate spinal canal stenosis. No foraminal narrowing.  C7-T1: no significant stenosis   IMPRESSION:  1. Multilevel degenerative changes of the cervical spine. OPLL with  resulting severe spinal canal stenosis and cord compression at C3-C4 and  C4-C5. No underlying cord signal abnormality.  2.  Foraminal narrowing, severe on the right at C3-C4 and C4-C5 and  moderate at other levels as above.   Procedure: CT CERVICAL SPINE WITHOUT CONTRAST (01/20/22) CERVICAL SPINE FINDINGS:  Alignment: Normal.  Occipital Condyles: Intact.  Bones: No fractures or facet dislocations.  Intervertebral Discs: Normal.  Prevertebral soft tissues: No prevertebral soft tissue swelling.  Regional Soft Tissues: The regional soft tissues are unremarkable.  Lung Apices: The partially visualized lung apices are clear.  Skull Base: The partially imaged skull base is unremarkable.  Degenerative changes:  Large, bulky osteophytes along the anterior aspect of the upper cervical  spine extending from approximately C1-C5. There is mass effect on the  posterior aspect of the oropharynx. There is cervical spine DISH and OPLL  extending from C4-T1.  C1-C2: Extensive heterotopic osseous formation about the atlantodens  interval.  C2-C3: Uncovertebral degenerative changes. No high-grade spinal canal  stenosis.  C3-C4: Uncovertebral and facet degenerative changes. OPLL results in severe  canal stenosis.  C4-C5: Uncovertebral and facet degenerative changes as well as OPLL,  resulting in severe canal stenosis.  C5-C6: Facet and uncovertebral degenerative changes. OPLL.  C6-C7: Facet and uncovertebral degenerative change. OPLL. Mild canal  stenosis.  C7-T1: No high-grade spinal canal or neuroforaminal stenosis.   IMPRESSION:  Extensive degenerative changes of the cervical spine with multilevel DISH  and OPLL. There is severe spinal canal stenosis at C3-C4 and C4-C5 better  evaluated on same day MRI.   Pain:  Pain  Intensity: Present: 0/10, Best: 0/10, Worst: 10/10 Pain location: R upper trap extending to the inside of the R shoulder blade, tightness on the left side of neck; Pain Quality: general tightness and muscle strain Radiating: Yes, Pain occasionally radiates from the R side of his neck to the top of his R shoulder; Numbness/Tingling: Yes, R side of neck and top of R shoulder, 2x/month numbness in R hand Focal Weakness: No Aggravating factors: Getting into/out of cars due to laterally flexing and rotating his head (takes a day or two to recover), most activities increase his pain, no real  change with TENS Relieving factors: rest after strain, Tylenol/ibuprofen, no significant difference with muscle relaxers, heat, ice helps when pt has significant muscle spasms, massage (self-massage and pt has had professional massage as well), minimal benefit from PT multiple years ago.  24-hour pain behavior: pain is worse at the end of the day History of prior neck injury, pain, surgery, or therapy: No, no surgery or notable trauma to neck Falls: Has patient fallen in last 6 months? 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  Recent memory is intact.  Remote memory is intact.  Attention  span and concentration are intact.  Expressive speech is intact.  Patient's fund of knowledge is within normal limits for educational level.    Gross Musculoskeletal Assessment Tremor: None Bulk: Normal Tone: Normal  Gait Deferred  Posture Upper thoracic kyphosis noted with forward head and rounded shoulders. Unable to correct with cues. Limited spontaneous cervical motion noted  AROM AROM (Normal range in degrees) AROM 07/19/2022  Cervical  Flexion (50) 25  Extension (80) 8  Right lateral flexion (45) 10  Left lateral flexion (45) 6  Right rotation (85) 15  Left rotation (85) 16  (* = pain; Blank rows = not tested)  MMT MMT (out of 5) Right 07/19/2022 Left 07/19/2022  Cervical (isometric)  Flexion WNL  Extension WNL  Lateral Flexion WNL WNL  Rotation WNL WNL      Shoulder   Flexion 5 5  Extension    Abduction 5 5  Internal rotation    Horizontal abduction    Horizontal adduction    Lower Trapezius    Rhomboids        Elbow  Flexion 5 5  Extension 5 5  Pronation    Supination        Wrist  Flexion 5 5  Extension 5 5  Radial deviation    Ulnar deviation        MCP  Flexion 5 5  Extension 5 5  Abduction 5 5  Adduction 5 5  (* = pain; Blank rows = not tested)  Sensation Deferred  Reflexes Deferred  Palpation  Location LEFT  RIGHT           Suboccipitals 0 0  Cervical paraspinals 0 0  Upper Trapezius 0 0  Levator Scapulae 0 0  Rhomboid Major/Minor 0 0  (Blank rows = not tested) Graded on 0-4 scale (0 = no pain, 1 = pain, 2 = pain with wincing/grimacing/flinching, 3 = pain with withdrawal, 4 = unwilling to allow palpation), (Blank rows = not tested)  Repeated Movements Deferred   Passive Accessory Intervertebral Motion Deferred  SPECIAL TESTS Spurlings A (ipsilateral lateral flexion/axial compression): R: Negative L: Negative Spurlings B (ipsilateral lateral flexion/contralateral rotation/axial compression): R: Negative L:  Negative Distraction Test: Not examined  Hoffman Sign (cervical  cord compression): R: Not examined L: Not examined ULTT Median: R: Not examined L: Not examined ULTT Ulnar: R: Not examined L: Not examined ULTT Radial: R: Not examined L: Not examined   TODAY'S TREATMENT    SUBJECTIVE: Pt reports that he is doing well today. No excessive muscle soreness after the last therapy session and no spasms. No pain upon arrival.  Overall pt reports that his cervical range of motion is improving since starting with therapy and he notices for a few hours after each session he can turn his head with greater ease. No specific questions or concerns.    PAIN: Denies   Trigger Point Dry Needling (TDN), unbilled Education performed with patient regarding potential benefit of TDN. Previously reviewed precautions and risks with patient. Pt provided verbal consent to treatment. In prone using clean technique TDN performed to bilateral upper traps with 4, 0.25 x 40 single needle placements (2 on each side) with local twitch response (LTR) during all placements. Also performed 2, 0.30 x 60 needle placements to cervical multifidi at C3 (one on each side) and 2, 0.30 x 60 single needle placements to splenius capitis/cervicis (one on each side). Pistoning technique utilized.   Manual Therapy Supine moist heat pack to cervical spine during interval history at start of session; Supine CPA C2-C4, grade II-III, 20s/bout x 1 bout/level; Supine MET cervical lateral flexion and rotation stretches (5s contract/5s relax) gradually progressing into more ROM x 1 bout each direction bilaterally; Supine manually resisted cervical rotation x 5 each direction to improve AROM;   Mechanical Traction Before, and separate from, manual therapy, performed mechanical cervical traction with pt in supine, 20-30 degree pull angle with Saunders unit. Utilized 30# x 10 minutes with therapist constantly monitoring patient. Pt denies any pain  during or after traction;   PATIENT EDUCATION:  Education details: Plan of care Person educated: Patient Education method: Explanation Education comprehension: verbalized understanding   HOME EXERCISE PROGRAM: Access Code: [email or web address] URL: [email or web address] Date: 07/26/2022 Prepared by: Roxana Hires  Exercises - Seated Cervical Retraction  - 1-2 x daily - 7 x weekly - 2 sets - 10 reps - 3s hold - Seated Scapular Retraction  - 1-2 x daily - 7 x weekly - 2 sets - 10 reps - 3s hold - Seated Cervical Sidebending Stretch  - 4-6 x daily - 7 x weekly - 2-3 reps - 45-60s hold - Seated Assisted Cervical Rotation with Towel  - 4-6 x daily - 7 x weekly - 2-3 reps - 45-60s hold   ASSESSMENT:  CLINICAL IMPRESSION: Progressed cervical range of motion today with manual techniques, dry needling, and mechanical traction. Measurements afterward demonstrate considerable improvement in cervical rotation compared to the initial evaluation. No HEP updates on this date however pt encouraged to continue with frequent stretching. Pt encouraged to follow-up as scheduled. He will benefit from skilled PT to address impairments in cervical range of motion and pain in order to improve function at home, work, and with leisure activities.  REHAB POTENTIAL: Fair    CLINICAL DECISION MAKING: Evolving/moderate complexity  EVALUATION COMPLEXITY: Moderate   GOALS: Goals reviewed with patient? Yes  SHORT TERM GOALS: Target date: 08/16/2022   Pt will be independent with HEP to improve strength and decrease neck pain to improve pain-free function at home and work. Baseline:  Goal status: INITIAL  LONG TERM GOALS: Target date: 09/13/2022   Pt will increase FOTO to at least 61 to demonstrate significant improvement in function at home and work  related to neck pain  Baseline: 07/19/22: 49 Goal status: INITIAL  2.  Pt will decrease worst neck pain by at least 2 points on the NPRS in order to  demonstrate clinically significant reduction in neck pain. Baseline: 07/19/22: 10/10; Goal status: INITIAL  3.  Pt will decrease NDI score by at least 19% in order demonstrate clinically significant reduction in neck pain/disability.       Baseline: 07/19/22: To be completed; 07/26/22: 10%; Goal status: DISCONTINUED  4.  Pt will increase cervical extension, bilateral rotation, and bilateral lateral flexion by at least 10 degrees each in order to improve his ability to work and drive with less pain Baseline: 07/19/22: extension: 8, lateral flexion (R/L): 10/6, rotation (R/L): 15/16; Goal status: INITIAL   PLAN: PT FREQUENCY: 1-2x/week  PT DURATION: 8 weeks  PLANNED INTERVENTIONS: Therapeutic exercises, Therapeutic activity, Neuromuscular re-education, Balance training, Gait training, Patient/Family education, Joint manipulation, Joint mobilization, Vestibular training, Canalith repositioning, Dry Needling, Electrical stimulation, Spinal manipulation, Spinal mobilization, Cryotherapy, Moist heat, Taping, Traction, Ultrasound, Ionotophoresis 52m/ml Dexamethasone, and Manual therapy  PLAN FOR NEXT SESSION:  progress manual techniques for ROM, TDN, review and modify HEP as necessary  JLyndel SafeHuprich PT, DPT, GCS  Sundy Houchins 08/15/2022, 8:46 AM

## 2022-08-14 ENCOUNTER — Ambulatory Visit: Payer: BC Managed Care – PPO

## 2022-08-14 DIAGNOSIS — M542 Cervicalgia: Secondary | ICD-10-CM

## 2022-08-21 ENCOUNTER — Ambulatory Visit: Payer: BC Managed Care – PPO

## 2022-08-21 DIAGNOSIS — M542 Cervicalgia: Secondary | ICD-10-CM

## 2022-08-21 NOTE — Therapy (Signed)
OUTPATIENT PHYSICAL THERAPY NECK TREATMENT  Patient Name: Jose Garcia MRN: [id number] DOB:Jan 02, 1961, 62 y.o., male Today's Date: 08/21/2022   PT End of Session - 08/21/22 1410     Visit Number 7    Number of Visits 17    Date for PT Re-Evaluation 09/13/22    Authorization Type eval: 07/19/22    PT Start Time 1400    PT Stop Time 1445    PT Time Calculation (min) 45 min    Activity Tolerance Patient tolerated treatment well    Behavior During Therapy [facility] for tasks assessed/performed            Past Medical History:  Diagnosis Date   Atrial fibrillation ([place])    Cardiomyopathy    Resolved 05/03/09   Hyperlipidemia    Hypertension 03/03/96   Sleep apnea    Past Surgical History:  Procedure Laterality Date   CARDIOVERSION     Patient Active Problem List   Diagnosis Date Noted   OSA on CPAP 10/08/2019   Post-nasal drainage 10/08/2019   Morbid obesity ([place]) 05/05/2015   ED (erectile dysfunction) 05/05/2015   Cardiomyopathy ([place]) 11/06/2014   EDEMA 01/07/2010   Paroxysmal atrial fibrillation ([place]) 01/22/2009   HYPERCHOLESTEROLEMIA 12/31/2007   Essential hypertension 12/31/2007   PCP: Olin Hauser, DO  REFERRING PROVIDER: Lanice Schwab, NP  REFERRING DIAGNOSIS: Cervicalgia  THERAPY DIAG: Cervicalgia  RATIONALE FOR EVALUATION AND TREATMENT: Rehabilitation  ONSET DATE: Approximately 10-12 years ago  FOLLOW UP APPT WITH PROVIDER: Yes, plans to see MD for a physical this year  FROM INITIAL EVALUATION SUBJECTIVE:                                                                                                                                                                                         Chief Complaint: Neck pain and limited motion;  Pertinent History Pt referred to physical therapy for limited neck mobility and pain. Mobility deficits have progressed for the last 10-12 years. He had seen chiropractor in the past but is not seeing one  currently. No prior history of head or neck surgery. He has had both a cervical CT and MRI which showed multilevel degenerative changes of the cervical spine. This included OPLL with resulting severe spinal canal stenosis and cord compression at C3-C4 and C4-C5 without underlying cord signal abnormality. Foraminal narrowing, severe on the right at C3-C4 and C4-C5 and moderate at other levels as above. Large, bulky osteophytes along the anterior aspect of the upper cervical spine extending from approximately C1-C5. There is mass effect on the posterior aspect of the oropharynx. Pt is having difficulty swallowing and reports  that his esophagus feels "closed up." Pt has consulted with neurosurgery but per pt report none of the surgeons have encouraged him to proceed with surgery given the high risk of worsening his symptoms.  MRI CERVICAL SPINE WITHOUT CONTRAST (01/20/22) FINDINGS:  Alignment: Normal alignment  Bone marrow Signal: no suspicious lesions  Spinal cord: Normal signal. There is complete effacement of the anterior  and posterior CSF space at the C3-C4 (9:24) and C4-C5 (9:18) levels  resulting in cord compression. There is no underlying cord signal  abnormality.  Flowing anterior osteophytes consistent with DISH. OPLL.  Paraspinal Soft Tissues: Mild prevertebral edema.  Occiput-C1: Mild degenerative changes.  [place]-dental interval: Degenerative narrowing  C1-2 lateral masses: no significant degenerative change  C2-C3: Disc osteophyte complex. Moderate facet arthrosis, right greater  than left. Mild spinal canal stenosis. No foraminal narrowing.  C3-C4: Disc osteophyte complex. Severe spinal canal stenosis and cord  compression. Severe right and moderate left foraminal narrowing.  C4-C5: OPLL. Severe spinal canal stenosis and cord compression. Severe  right and mild left foraminal narrowing.  C5-C6: OPLL. Moderate spinal canal stenosis. Mild bilateral foraminal  narrowing.  C6-C7: OPLL.  Moderate spinal canal stenosis. No foraminal narrowing.  C7-T1: no significant stenosis   IMPRESSION:  1. Multilevel degenerative changes of the cervical spine. OPLL with  resulting severe spinal canal stenosis and cord compression at C3-C4 and  C4-C5. No underlying cord signal abnormality.  2.  Foraminal narrowing, severe on the right at C3-C4 and C4-C5 and  moderate at other levels as above.   Procedure: CT CERVICAL SPINE WITHOUT CONTRAST (01/20/22) CERVICAL SPINE FINDINGS:  Alignment: Normal.  Occipital Condyles: Intact.  Bones: No fractures or facet dislocations.  Intervertebral Discs: Normal.  Prevertebral soft tissues: No prevertebral soft tissue swelling.  Regional Soft Tissues: The regional soft tissues are unremarkable.  Lung Apices: The partially visualized lung apices are clear.  Skull Base: The partially imaged skull base is unremarkable.  Degenerative changes:  Large, bulky osteophytes along the anterior aspect of the upper cervical  spine extending from approximately C1-C5. There is mass effect on the  posterior aspect of the oropharynx. There is cervical spine DISH and OPLL  extending from C4-T1.  C1-C2: Extensive heterotopic osseous formation about the atlantodens  interval.  C2-C3: Uncovertebral degenerative changes. No high-grade spinal canal  stenosis.  C3-C4: Uncovertebral and facet degenerative changes. OPLL results in severe  canal stenosis.  C4-C5: Uncovertebral and facet degenerative changes as well as OPLL,  resulting in severe canal stenosis.  C5-C6: Facet and uncovertebral degenerative changes. OPLL.  C6-C7: Facet and uncovertebral degenerative change. OPLL. Mild canal  stenosis.  C7-T1: No high-grade spinal canal or neuroforaminal stenosis.   IMPRESSION:  Extensive degenerative changes of the cervical spine with multilevel DISH  and OPLL. There is severe spinal canal stenosis at C3-C4 and C4-C5 better  evaluated on same day MRI.   Pain:  Pain  Intensity: Present: 0/10, Best: 0/10, Worst: 10/10 Pain location: R upper trap extending to the inside of the R shoulder blade, tightness on the left side of neck; Pain Quality: general tightness and muscle strain Radiating: Yes, Pain occasionally radiates from the R side of his neck to the top of his R shoulder; Numbness/Tingling: Yes, R side of neck and top of R shoulder, 2x/month numbness in R hand Focal Weakness: No Aggravating factors: Getting into/out of cars due to laterally flexing and rotating his head (takes a day or two to recover), most activities increase his pain, no real  change with TENS Relieving factors: rest after strain, Tylenol/ibuprofen, no significant difference with muscle relaxers, heat, ice helps when pt has significant muscle spasms, massage (self-massage and pt has had professional massage as well), minimal benefit from PT multiple years ago.  24-hour pain behavior: pain is worse at the end of the day History of prior neck injury, pain, surgery, or therapy: No, no surgery or notable trauma to neck Falls: Has patient fallen in last 6 months? 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  Recent memory is intact.  Remote memory is intact.  Attention  span and concentration are intact.  Expressive speech is intact.  Patient's fund of knowledge is within normal limits for educational level.    Gross Musculoskeletal Assessment Tremor: None Bulk: Normal Tone: Normal  Gait Deferred  Posture Upper thoracic kyphosis noted with forward head and rounded shoulders. Unable to correct with cues. Limited spontaneous cervical motion noted  AROM AROM (Normal range in degrees) AROM 07/19/2022  Cervical  Flexion (50) 25  Extension (80) 8  Right lateral flexion (45) 10  Left lateral flexion (45) 6  Right rotation (85) 15  Left rotation (85) 16  (* = pain; Blank rows = not tested)  MMT MMT (out of 5) Right 07/19/2022 Left 07/19/2022  Cervical (isometric)  Flexion WNL  Extension WNL  Lateral Flexion WNL WNL  Rotation WNL WNL      Shoulder   Flexion 5 5  Extension    Abduction 5 5  Internal rotation    Horizontal abduction    Horizontal adduction    Lower Trapezius    Rhomboids        Elbow  Flexion 5 5  Extension 5 5  Pronation    Supination        Wrist  Flexion 5 5  Extension 5 5  Radial deviation    Ulnar deviation        MCP  Flexion 5 5  Extension 5 5  Abduction 5 5  Adduction 5 5  (* = pain; Blank rows = not tested)  Sensation Deferred  Reflexes Deferred  Palpation  Location LEFT  RIGHT           Suboccipitals 0 0  Cervical paraspinals 0 0  Upper Trapezius 0 0  Levator Scapulae 0 0  Rhomboid Major/Minor 0 0  (Blank rows = not tested) Graded on 0-4 scale (0 = no pain, 1 = pain, 2 = pain with wincing/grimacing/flinching, 3 = pain with withdrawal, 4 = unwilling to allow palpation), (Blank rows = not tested)  Repeated Movements Deferred   Passive Accessory Intervertebral Motion Deferred  SPECIAL TESTS Spurlings A (ipsilateral lateral flexion/axial compression): R: Negative L: Negative Spurlings B (ipsilateral lateral flexion/contralateral rotation/axial compression): R: Negative L:  Negative Distraction Test: Not examined  Hoffman Sign (cervical  cord compression): R: Not examined L: Not examined ULTT Median: R: Not examined L: Not examined ULTT Ulnar: R: Not examined L: Not examined ULTT Radial: R: Not examined L: Not examined   TODAY'S TREATMENT    SUBJECTIVE: Pt reports that he is doing well today. No excessive muscle soreness after the last therapy session and no spasms. No pain upon arrival.  Overall pt reports that his cervical range of motion continues improving after therapy sessions. No specific questions or concerns.    PAIN: Denies   Trigger Point Dry Needling (TDN), unbilled Education performed with patient regarding potential benefit of TDN. Previously reviewed precautions and risks with patient. Pt provided verbal consent to treatment. In prone using clean technique TDN performed to bilateral upper traps with 4, 0.25 x 40 single needle placements (2 on each side) with local twitch response (LTR) during all placements. Also performed 2, 0.30 x 60 needle placements to cervical multifidi at C3 (one on each side) and 2, 0.30 x 60 single needle placements to splenius capitis/cervicis (one on each side). Pistoning technique utilized.   Manual Therapy Supine moist heat pack to cervical spine during interval history at start of session; Supine MET cervical lateral flexion and rotation stretches (5s contract/5s relax) gradually progressing into more ROM x 2 bouts each direction bilaterally; Prone CPA C2-T4, grade II-III, 20s/bout x 1 bout/level;   Mechanical Traction Before, and separate from, manual therapy, performed mechanical cervical traction with pt in supine, 20-30 degree pull angle with Saunders unit. Utilized 30# x 10 minutes with therapist constantly monitoring patient. Pt denies any pain during or after traction;   PATIENT EDUCATION:  Education details: Plan of care Person educated: Patient Education method: Explanation Education comprehension:  verbalized understanding   HOME EXERCISE PROGRAM: Access Code: [email or web address] URL: [email or web address] Date: 07/26/2022 Prepared by: Roxana Hires  Exercises - Seated Cervical Retraction  - 1-2 x daily - 7 x weekly - 2 sets - 10 reps - 3s hold - Seated Scapular Retraction  - 1-2 x daily - 7 x weekly - 2 sets - 10 reps - 3s hold - Seated Cervical Sidebending Stretch  - 4-6 x daily - 7 x weekly - 2-3 reps - 45-60s hold - Seated Assisted Cervical Rotation with Towel  - 4-6 x daily - 7 x weekly - 2-3 reps - 45-60s hold   ASSESSMENT:  CLINICAL IMPRESSION: Progressed cervical range of motion today with manual techniques, dry needling, and mechanical traction. No HEP updates on this date. Reinforced the importance of stretching at home. Pt encouraged to follow-up as scheduled. He will benefit from skilled PT to address impairments in cervical range of motion and pain in order to improve function at home, work, and with leisure activities.  REHAB POTENTIAL: Fair    CLINICAL DECISION MAKING: Evolving/moderate complexity  EVALUATION COMPLEXITY: Moderate   GOALS: Goals reviewed with patient? Yes  SHORT TERM GOALS: Target date: 08/16/2022   Pt will be independent with HEP to improve strength and decrease neck pain to improve pain-free function at home and work. Baseline:  Goal status: INITIAL  LONG TERM GOALS: Target date: 09/13/2022   Pt will increase FOTO to at least 61 to demonstrate significant improvement in function at home and work related to neck pain  Baseline: 07/19/22: 49 Goal status: INITIAL  2.  Pt will decrease worst neck pain by at least 2 points on the NPRS in order to demonstrate clinically significant reduction in neck pain. Baseline: 07/19/22: 10/10; Goal status: INITIAL  3.  Pt will decrease NDI score by at least 19% in order demonstrate clinically significant reduction in neck pain/disability.       Baseline: 07/19/22: To be completed; 07/26/22: 10%; Goal  status: DISCONTINUED  4.  Pt will increase cervical extension, bilateral rotation, and bilateral lateral flexion by at least 10 degrees each in order to improve his ability to work and drive with less pain Baseline: 07/19/22: extension: 8, lateral flexion (R/L): 10/6, rotation (R/L): 15/16; Goal status: INITIAL   PLAN: PT FREQUENCY: 1-2x/week  PT DURATION: 8 weeks  PLANNED INTERVENTIONS: Therapeutic exercises, Therapeutic activity, Neuromuscular re-education, Balance training, Gait training, Patient/Family education, Joint manipulation, Joint mobilization, Vestibular training, Canalith repositioning, Dry Needling, Electrical stimulation, Spinal manipulation, Spinal mobilization, Cryotherapy, Moist heat, Taping, Traction, Ultrasound, Ionotophoresis 25m/ml Dexamethasone, and Manual therapy  PLAN FOR NEXT SESSION:  progress manual techniques for ROM, TDN, review and modify HEP as necessary  JLyndel SafeHuprich PT, DPT, GCS  Gracee Ratterree 08/21/2022, 9:32 PM

## 2022-08-23 ENCOUNTER — Other Ambulatory Visit
Admission: RE | Admit: 2022-08-23 | Discharge: 2022-08-23 | Disposition: A | Discharge status: Home / Self Care | Payer: BC Managed Care – PPO | Attending: Cardiology | Admitting: Cardiology

## 2022-08-23 ENCOUNTER — Ambulatory Visit: Payer: BC Managed Care – PPO

## 2022-08-23 DIAGNOSIS — I10 Essential (primary) hypertension: Secondary | ICD-10-CM

## 2022-08-23 DIAGNOSIS — I480 Paroxysmal atrial fibrillation: Secondary | ICD-10-CM | POA: Diagnosis not present

## 2022-08-23 DIAGNOSIS — M542 Cervicalgia: Secondary | ICD-10-CM

## 2022-08-23 DIAGNOSIS — G4733 Obstructive sleep apnea (adult) (pediatric): Secondary | ICD-10-CM

## 2022-08-23 LAB — CBC WITH DIFFERENTIAL/PLATELET
Abs Immature Granulocytes: 0.02 10*3/uL (ref 0.00–0.07)
Basophils Absolute: 0 10*3/uL (ref 0.0–0.1)
Basophils Relative: 0 %
Eosinophils Absolute: 0.1 10*3/uL (ref 0.0–0.5)
Eosinophils Relative: 1 %
HCT: 40.3 % (ref 39.0–52.0)
Hemoglobin: 14.1 g/dL (ref 13.0–17.0)
Immature Granulocytes: 0 %
Lymphocytes Relative: 17 %
Lymphs Abs: 1.1 10*3/uL (ref 0.7–4.0)
MCH: 32.9 pg (ref 26.0–34.0)
MCHC: 35 g/dL (ref 30.0–36.0)
MCV: 94.2 fL (ref 80.0–100.0)
Monocytes Absolute: 0.9 10*3/uL (ref 0.1–1.0)
Monocytes Relative: 13 %
Neutro Abs: 4.6 10*3/uL (ref 1.7–7.7)
Neutrophils Relative %: 69 %
Platelets: 162 10*3/uL (ref 150–400)
RBC: 4.28 MIL/uL (ref 4.22–5.81)
RDW: 11.8 % (ref 11.5–15.5)
WBC: 6.7 10*3/uL (ref 4.0–10.5)
nRBC: 0 % (ref 0.0–0.2)

## 2022-08-23 LAB — BASIC METABOLIC PANEL
Anion gap: 7 (ref 5–15)
BUN: 15 mg/dL (ref 8–23)
CO2: 28 mmol/L (ref 22–32)
Calcium: 9.2 mg/dL (ref 8.9–10.3)
Chloride: 103 mmol/L (ref 98–111)
Creatinine, Ser: 0.9 mg/dL (ref 0.61–1.24)
GFR, Estimated: >60 mL/min (ref >60)
Glucose, Bld: 98 mg/dL (ref 70–99)
Potassium: 4.1 mmol/L (ref 3.5–5.1)
Sodium: 138 mmol/L (ref 135–145)

## 2022-08-25 ENCOUNTER — Telehealth (HOSPITAL_COMMUNITY): Payer: Self-pay | Admitting: Emergency Medicine

## 2022-08-25 NOTE — Therapy (Signed)
OUTPATIENT PHYSICAL THERAPY NECK TREATMENT  Patient Name: Jose Garcia MRN: [id number] DOB:[birth year], 62 y.o., male Today's Date: 08/25/2022   PT End of Session - 08/25/22 1405     Visit Number 8    Number of Visits 17    Date for PT Re-Evaluation 09/13/22    Authorization Type eval: 07/19/22    PT Start Time 1022    PT Stop Time 1105    PT Time Calculation (min) 43 min    Activity Tolerance Patient tolerated treatment well    Behavior During Therapy [facility] for tasks assessed/performed            Past Medical History:  Diagnosis Date   Atrial fibrillation ([place])    Cardiomyopathy    Resolved 05/03/09   Hyperlipidemia    Hypertension 03/03/96   Sleep apnea    Past Surgical History:  Procedure Laterality Date   CARDIOVERSION     Patient Active Problem List   Diagnosis Date Noted   OSA on CPAP 10/08/2019   Post-nasal drainage 10/08/2019   Morbid obesity ([place]) 05/05/2015   ED (erectile dysfunction) 05/05/2015   Cardiomyopathy ([place]) 11/06/2014   EDEMA 01/07/2010   Paroxysmal atrial fibrillation ([place]) 01/22/2009   HYPERCHOLESTEROLEMIA 12/31/2007   Essential hypertension 12/31/2007   PCP: Olin Hauser, DO  REFERRING PROVIDER: Lanice Schwab, NP  REFERRING DIAGNOSIS: Cervicalgia  THERAPY DIAG: Cervicalgia  RATIONALE FOR EVALUATION AND TREATMENT: Rehabilitation  ONSET DATE: Approximately 10-12 years ago  FOLLOW UP APPT WITH PROVIDER: Yes, plans to see MD for a physical this year  FROM INITIAL EVALUATION SUBJECTIVE:                                                                                                                                                                                         Chief Complaint: Neck pain and limited motion;  Pertinent History Pt referred to physical therapy for limited neck mobility and pain. Mobility deficits have progressed for the last 10-12 years. He had seen chiropractor in the past but is not seeing one  currently. No prior history of head or neck surgery. He has had both a cervical CT and MRI which showed multilevel degenerative changes of the cervical spine. This included OPLL with resulting severe spinal canal stenosis and cord compression at C3-C4 and C4-C5 without underlying cord signal abnormality. Foraminal narrowing, severe on the right at C3-C4 and C4-C5 and moderate at other levels as above. Large, bulky osteophytes along the anterior aspect of the upper cervical spine extending from approximately C1-C5. There is mass effect on the posterior aspect of the oropharynx. Pt is having difficulty swallowing and reports  that his esophagus feels "closed up." Pt has consulted with neurosurgery but per pt report none of the surgeons have encouraged him to proceed with surgery given the high risk of worsening his symptoms.  MRI CERVICAL SPINE WITHOUT CONTRAST (01/20/22) FINDINGS:  Alignment: Normal alignment  Bone marrow Signal: no suspicious lesions  Spinal cord: Normal signal. There is complete effacement of the anterior  and posterior CSF space at the C3-C4 (9:24) and C4-C5 (9:18) levels  resulting in cord compression. There is no underlying cord signal  abnormality.  Flowing anterior osteophytes consistent with DISH. OPLL.  Paraspinal Soft Tissues: Mild prevertebral edema.  Occiput-C1: Mild degenerative changes.  [place]-dental interval: Degenerative narrowing  C1-2 lateral masses: no significant degenerative change  C2-C3: Disc osteophyte complex. Moderate facet arthrosis, right greater  than left. Mild spinal canal stenosis. No foraminal narrowing.  C3-C4: Disc osteophyte complex. Severe spinal canal stenosis and cord  compression. Severe right and moderate left foraminal narrowing.  C4-C5: OPLL. Severe spinal canal stenosis and cord compression. Severe  right and mild left foraminal narrowing.  C5-C6: OPLL. Moderate spinal canal stenosis. Mild bilateral foraminal  narrowing.  C6-C7: OPLL.  Moderate spinal canal stenosis. No foraminal narrowing.  C7-T1: no significant stenosis   IMPRESSION:  1. Multilevel degenerative changes of the cervical spine. OPLL with  resulting severe spinal canal stenosis and cord compression at C3-C4 and  C4-C5. No underlying cord signal abnormality.  2.  Foraminal narrowing, severe on the right at C3-C4 and C4-C5 and  moderate at other levels as above.   Procedure: CT CERVICAL SPINE WITHOUT CONTRAST (01/20/22) CERVICAL SPINE FINDINGS:  Alignment: Normal.  Occipital Condyles: Intact.  Bones: No fractures or facet dislocations.  Intervertebral Discs: Normal.  Prevertebral soft tissues: No prevertebral soft tissue swelling.  Regional Soft Tissues: The regional soft tissues are unremarkable.  Lung Apices: The partially visualized lung apices are clear.  Skull Base: The partially imaged skull base is unremarkable.  Degenerative changes:  Large, bulky osteophytes along the anterior aspect of the upper cervical  spine extending from approximately C1-C5. There is mass effect on the  posterior aspect of the oropharynx. There is cervical spine DISH and OPLL  extending from C4-T1.  C1-C2: Extensive heterotopic osseous formation about the atlantodens  interval.  C2-C3: Uncovertebral degenerative changes. No high-grade spinal canal  stenosis.  C3-C4: Uncovertebral and facet degenerative changes. OPLL results in severe  canal stenosis.  C4-C5: Uncovertebral and facet degenerative changes as well as OPLL,  resulting in severe canal stenosis.  C5-C6: Facet and uncovertebral degenerative changes. OPLL.  C6-C7: Facet and uncovertebral degenerative change. OPLL. Mild canal  stenosis.  C7-T1: No high-grade spinal canal or neuroforaminal stenosis.   IMPRESSION:  Extensive degenerative changes of the cervical spine with multilevel DISH  and OPLL. There is severe spinal canal stenosis at C3-C4 and C4-C5 better  evaluated on same day MRI.   Pain:  Pain  Intensity: Present: 0/10, Best: 0/10, Worst: 10/10 Pain location: R upper trap extending to the inside of the R shoulder blade, tightness on the left side of neck; Pain Quality: general tightness and muscle strain Radiating: Yes, Pain occasionally radiates from the R side of his neck to the top of his R shoulder; Numbness/Tingling: Yes, R side of neck and top of R shoulder, 2x/month numbness in R hand Focal Weakness: No Aggravating factors: Getting into/out of cars due to laterally flexing and rotating his head (takes a day or two to recover), most activities increase his pain, no real  change with TENS Relieving factors: rest after strain, Tylenol/ibuprofen, no significant difference with muscle relaxers, heat, ice helps when pt has significant muscle spasms, massage (self-massage and pt has had professional massage as well), minimal benefit from PT multiple years ago.  24-hour pain behavior: pain is worse at the end of the day History of prior neck injury, pain, surgery, or therapy: No, no surgery or notable trauma to neck Falls: Has patient fallen in last 6 months? 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  Recent memory is intact.  Remote memory is intact.  Attention  span and concentration are intact.  Expressive speech is intact.  Patient's fund of knowledge is within normal limits for educational level.    Gross Musculoskeletal Assessment Tremor: None Bulk: Normal Tone: Normal  Gait Deferred  Posture Upper thoracic kyphosis noted with forward head and rounded shoulders. Unable to correct with cues. Limited spontaneous cervical motion noted  AROM AROM (Normal range in degrees) AROM 07/19/2022  Cervical  Flexion (50) 25  Extension (80) 8  Right lateral flexion (45) 10  Left lateral flexion (45) 6  Right rotation (85) 15  Left rotation (85) 16  (* = pain; Blank rows = not tested)  MMT MMT (out of 5) Right 07/19/2022 Left 07/19/2022  Cervical (isometric)  Flexion WNL  Extension WNL  Lateral Flexion WNL WNL  Rotation WNL WNL      Shoulder   Flexion 5 5  Extension    Abduction 5 5  Internal rotation    Horizontal abduction    Horizontal adduction    Lower Trapezius    Rhomboids        Elbow  Flexion 5 5  Extension 5 5  Pronation    Supination        Wrist  Flexion 5 5  Extension 5 5  Radial deviation    Ulnar deviation        MCP  Flexion 5 5  Extension 5 5  Abduction 5 5  Adduction 5 5  (* = pain; Blank rows = not tested)  Sensation Deferred  Reflexes Deferred  Palpation  Location LEFT  RIGHT           Suboccipitals 0 0  Cervical paraspinals 0 0  Upper Trapezius 0 0  Levator Scapulae 0 0  Rhomboid Major/Minor 0 0  (Blank rows = not tested) Graded on 0-4 scale (0 = no pain, 1 = pain, 2 = pain with wincing/grimacing/flinching, 3 = pain with withdrawal, 4 = unwilling to allow palpation), (Blank rows = not tested)  Repeated Movements Deferred   Passive Accessory Intervertebral Motion Deferred  SPECIAL TESTS Spurlings A (ipsilateral lateral flexion/axial compression): R: Negative L: Negative Spurlings B (ipsilateral lateral flexion/contralateral rotation/axial compression): R: Negative L:  Negative Distraction Test: Not examined  Hoffman Sign (cervical  cord compression): R: Not examined L: Not examined ULTT Median: R: Not examined L: Not examined ULTT Ulnar: R: Not examined L: Not examined ULTT Radial: R: Not examined L: Not examined   TODAY'S TREATMENT    SUBJECTIVE: Pt reports that he is doing well today. No excessive muscle soreness after the last therapy session and no spasms. No pain upon arrival.. No specific questions or concerns.    PAIN: Denies   Trigger Point Dry Needling (TDN), unbilled Education performed with patient regarding potential benefit of TDN. Previously reviewed precautions and risks with patient. Pt provided verbal consent to treatment. In supine using clean technique TDN performed to bilateral upper traps with 4, 0.25 x 40 single needle placements (2 on each side) with local twitch response (LTR) during all placements. Pistoning technique utilized;   Manual Therapy Supine moist heat pack to cervical spine during interval history at start of session; Supine MET cervical lateral flexion and rotation stretches (5s contract/5s relax) gradually progressing into more ROM x 2 bouts each direction bilaterally; Supine CPA C2-C5, grade I-II, 20s/bout x 1 bout/level; Supine lateral cervical glides (C4-C6) during lateral flexion stretch, grade III, 20s/bout x 1 bout per level; Manual belt traction 15s hold/15s relax x 5; Supine resisted cervical rotation to improve ROM x 5 each direction;   PATIENT EDUCATION:  Education details: Plan of care; Person educated: Patient Education method: Explanation Education comprehension: verbalized understanding   HOME EXERCISE PROGRAM: Access Code: [email or web address] URL: [email or web address] Date: 07/26/2022 Prepared by: Roxana Hires  Exercises - Seated Cervical Retraction  - 1-2 x daily - 7 x weekly - 2 sets - 10 reps - 3s hold - Seated Scapular Retraction  - 1-2 x daily - 7 x weekly - 2 sets - 10 reps  - 3s hold - Seated Cervical Sidebending Stretch  - 4-6 x daily - 7 x weekly - 2-3 reps - 45-60s hold - Seated Assisted Cervical Rotation with Towel  - 4-6 x daily - 7 x weekly - 2-3 reps - 45-60s hold   ASSESSMENT:  CLINICAL IMPRESSION: Progressed cervical range of motion today with manual techniques and dry needling. Utilized manual traction instead of mechanical traction however pt appears to have better ROM when utilizing mechanical traction at the beginning of sessions. No HEP updates on this date. Reinforced the importance of stretching at home. Pt encouraged to follow-up as scheduled. He will benefit from skilled PT to address impairments in cervical range of motion and pain in order to improve function at home, work, and with leisure activities.  REHAB POTENTIAL: Fair    CLINICAL DECISION MAKING: Evolving/moderate complexity  EVALUATION COMPLEXITY: Moderate   GOALS: Goals reviewed with patient? Yes  SHORT TERM GOALS: Target date: 08/16/2022   Pt will be independent with HEP to improve strength and decrease neck pain to improve pain-free function at home and work. Baseline:  Goal status: INITIAL  LONG TERM GOALS: Target date: 09/13/2022   Pt will increase FOTO to at least 61 to demonstrate significant improvement in function at home and work related to neck pain  Baseline: 07/19/22: 49 Goal status: INITIAL  2.  Pt will decrease worst neck pain by at least 2 points on the NPRS in order to demonstrate clinically significant reduction in neck pain. Baseline: 07/19/22: 10/10; Goal status: INITIAL  3.  Pt will decrease NDI score by at least 19% in order demonstrate clinically significant reduction in neck pain/disability.       Baseline: 07/19/22: To be completed; 07/26/22: 10%;  Goal status: DISCONTINUED  4.  Pt will increase cervical extension, bilateral rotation, and bilateral lateral flexion by at least 10 degrees each in order to improve his ability to work and drive with less  pain Baseline: 07/19/22: extension: 8, lateral flexion (R/L): 10/6, rotation (R/L): 15/16; Goal status: INITIAL   PLAN: PT FREQUENCY: 1-2x/week  PT DURATION: 8 weeks  PLANNED INTERVENTIONS: Therapeutic exercises, Therapeutic activity, Neuromuscular re-education, Balance training, Gait training, Patient/Family education, Joint manipulation, Joint mobilization, Vestibular training, Canalith repositioning, Dry Needling, Electrical stimulation, Spinal manipulation, Spinal mobilization, Cryotherapy, Moist heat, Taping, Traction, Ultrasound, Ionotophoresis '4mg'$ /ml Dexamethasone, and Manual therapy  PLAN FOR NEXT SESSION:  progress manual techniques for ROM, TDN, review and modify HEP as necessary  Lyndel Safe Jorgia Manthei PT, DPT, GCS  Jose Garcia 08/25/2022, 2:11 PM

## 2022-08-25 NOTE — Telephone Encounter (Signed)
Reaching out to patient to offer assistance regarding upcoming cardiac imaging study; pt verbalizes understanding of appt date/time, parking situation and where to check in, pre-test NPO status and medications ordered, and verified current allergies; name and call back number provided for further questions should they arise Jose Bond RN Navigator Cardiac Imaging [facility] [phone] office [phone] cell  Arrival 915 [facility] Denies iv issues Daily meds- holding diuretics Aware contrast

## 2022-08-28 ENCOUNTER — Ambulatory Visit (HOSPITAL_BASED_OUTPATIENT_CLINIC_OR_DEPARTMENT_OTHER)
Admission: RE | Admit: 2022-08-28 | Discharge: 2022-08-28 | Disposition: A | Discharge status: Home / Self Care | Payer: BC Managed Care – PPO | Source: Ambulatory Visit | Attending: Cardiology | Admitting: Cardiology

## 2022-08-28 ENCOUNTER — Encounter (HOSPITAL_BASED_OUTPATIENT_CLINIC_OR_DEPARTMENT_OTHER): Payer: Self-pay

## 2022-08-28 DIAGNOSIS — G4733 Obstructive sleep apnea (adult) (pediatric): Secondary | ICD-10-CM | POA: Diagnosis present

## 2022-08-28 DIAGNOSIS — I10 Essential (primary) hypertension: Secondary | ICD-10-CM | POA: Diagnosis present

## 2022-08-28 DIAGNOSIS — I480 Paroxysmal atrial fibrillation: Secondary | ICD-10-CM | POA: Diagnosis present

## 2022-08-28 MED ORDER — IOHEXOL 350 MG/ML SOLN
100.0000 mL | Freq: Once | INTRAVENOUS | Status: AC | PRN
  Administered 2022-08-28: 80 mL via INTRAVENOUS

## 2022-09-01 ENCOUNTER — Ambulatory Visit: Payer: BC Managed Care – PPO | Attending: Family

## 2022-09-01 DIAGNOSIS — M542 Cervicalgia: Secondary | ICD-10-CM | POA: Diagnosis present

## 2022-09-01 NOTE — Pre-Procedure Instructions (Signed)
Attempted to call patient regarding procedure instructions.  Left voicemail on the following items: Arrival time 0830 Nothing to eat or drink after midnight No meds AM of procedure Responsible person to drive you home and stay with you for 24 hrs  Have you missed any doses of anti-coagulant Eliquis- if you have missed any doses please let office know right away, take dose today, Saturday and Sunday.  Don't take any on Monday morning

## 2022-09-01 NOTE — Therapy (Signed)
OUTPATIENT PHYSICAL THERAPY NECK TREATMENT  Patient Name: Jose Garcia MRN: [id number] DOB:[birth year], 62 y.o., male Today's Date: 09/01/2022   PT End of Session - 09/01/22 0853     Visit Number 9    Number of Visits 17    Date for PT Re-Evaluation 09/13/22    Authorization Type eval: 07/19/22    PT Start Time 0845    PT Stop Time 0930    PT Time Calculation (min) 45 min    Activity Tolerance Patient tolerated treatment well    Behavior During Therapy [facility] for tasks assessed/performed            Past Medical History:  Diagnosis Date   Atrial fibrillation ([place])    Cardiomyopathy    Resolved 05/03/09   Hyperlipidemia    Hypertension 03/03/96   Sleep apnea    Past Surgical History:  Procedure Laterality Date   CARDIOVERSION     Patient Active Problem List   Diagnosis Date Noted   OSA on CPAP 10/08/2019   Post-nasal drainage 10/08/2019   Morbid obesity ([place]) 05/05/2015   ED (erectile dysfunction) 05/05/2015   Cardiomyopathy ([place]) 11/06/2014   EDEMA 01/07/2010   Paroxysmal atrial fibrillation ([place]) 01/22/2009   HYPERCHOLESTEROLEMIA 12/31/2007   Essential hypertension 12/31/2007   PCP: Olin Hauser, DO  REFERRING PROVIDER: Lanice Schwab, NP  REFERRING DIAGNOSIS: Cervicalgia  THERAPY DIAG: Cervicalgia  RATIONALE FOR EVALUATION AND TREATMENT: Rehabilitation  ONSET DATE: Approximately 10-12 years ago  FOLLOW UP APPT WITH PROVIDER: Yes, plans to see MD for a physical this year  FROM INITIAL EVALUATION SUBJECTIVE:                                                                                                                                                                                         Chief Complaint: Neck pain and limited motion;  Pertinent History Pt referred to physical therapy for limited neck mobility and pain. Mobility deficits have progressed for the last 10-12 years. He had seen chiropractor in the past but is not seeing one  currently. No prior history of head or neck surgery. He has had both a cervical CT and MRI which showed multilevel degenerative changes of the cervical spine. This included OPLL with resulting severe spinal canal stenosis and cord compression at C3-C4 and C4-C5 without underlying cord signal abnormality. Foraminal narrowing, severe on the right at C3-C4 and C4-C5 and moderate at other levels as above. Large, bulky osteophytes along the anterior aspect of the upper cervical spine extending from approximately C1-C5. There is mass effect on the posterior aspect of the oropharynx. Pt is having difficulty swallowing and reports  that his esophagus feels "closed up." Pt has consulted with neurosurgery but per pt report none of the surgeons have encouraged him to proceed with surgery given the high risk of worsening his symptoms.  MRI CERVICAL SPINE WITHOUT CONTRAST (01/20/22) FINDINGS:  Alignment: Normal alignment  Bone marrow Signal: no suspicious lesions  Spinal cord: Normal signal. There is complete effacement of the anterior  and posterior CSF space at the C3-C4 (9:24) and C4-C5 (9:18) levels  resulting in cord compression. There is no underlying cord signal  abnormality.  Flowing anterior osteophytes consistent with DISH. OPLL.  Paraspinal Soft Tissues: Mild prevertebral edema.  Occiput-C1: Mild degenerative changes.  [place]-dental interval: Degenerative narrowing  C1-2 lateral masses: no significant degenerative change  C2-C3: Disc osteophyte complex. Moderate facet arthrosis, right greater  than left. Mild spinal canal stenosis. No foraminal narrowing.  C3-C4: Disc osteophyte complex. Severe spinal canal stenosis and cord  compression. Severe right and moderate left foraminal narrowing.  C4-C5: OPLL. Severe spinal canal stenosis and cord compression. Severe  right and mild left foraminal narrowing.  C5-C6: OPLL. Moderate spinal canal stenosis. Mild bilateral foraminal  narrowing.  C6-C7: OPLL.  Moderate spinal canal stenosis. No foraminal narrowing.  C7-T1: no significant stenosis   IMPRESSION:  1. Multilevel degenerative changes of the cervical spine. OPLL with  resulting severe spinal canal stenosis and cord compression at C3-C4 and  C4-C5. No underlying cord signal abnormality.  2.  Foraminal narrowing, severe on the right at C3-C4 and C4-C5 and  moderate at other levels as above.   Procedure: CT CERVICAL SPINE WITHOUT CONTRAST (01/20/22) CERVICAL SPINE FINDINGS:  Alignment: Normal.  Occipital Condyles: Intact.  Bones: No fractures or facet dislocations.  Intervertebral Discs: Normal.  Prevertebral soft tissues: No prevertebral soft tissue swelling.  Regional Soft Tissues: The regional soft tissues are unremarkable.  Lung Apices: The partially visualized lung apices are clear.  Skull Base: The partially imaged skull base is unremarkable.  Degenerative changes:  Large, bulky osteophytes along the anterior aspect of the upper cervical  spine extending from approximately C1-C5. There is mass effect on the  posterior aspect of the oropharynx. There is cervical spine DISH and OPLL  extending from C4-T1.  C1-C2: Extensive heterotopic osseous formation about the atlantodens  interval.  C2-C3: Uncovertebral degenerative changes. No high-grade spinal canal  stenosis.  C3-C4: Uncovertebral and facet degenerative changes. OPLL results in severe  canal stenosis.  C4-C5: Uncovertebral and facet degenerative changes as well as OPLL,  resulting in severe canal stenosis.  C5-C6: Facet and uncovertebral degenerative changes. OPLL.  C6-C7: Facet and uncovertebral degenerative change. OPLL. Mild canal  stenosis.  C7-T1: No high-grade spinal canal or neuroforaminal stenosis.   IMPRESSION:  Extensive degenerative changes of the cervical spine with multilevel DISH  and OPLL. There is severe spinal canal stenosis at C3-C4 and C4-C5 better  evaluated on same day MRI.   Pain:  Pain  Intensity: Present: 0/10, Best: 0/10, Worst: 10/10 Pain location: R upper trap extending to the inside of the R shoulder blade, tightness on the left side of neck; Pain Quality: general tightness and muscle strain Radiating: Yes, Pain occasionally radiates from the R side of his neck to the top of his R shoulder; Numbness/Tingling: Yes, R side of neck and top of R shoulder, 2x/month numbness in R hand Focal Weakness: No Aggravating factors: Getting into/out of cars due to laterally flexing and rotating his head (takes a day or two to recover), most activities increase his pain, no real  change with TENS Relieving factors: rest after strain, Tylenol/ibuprofen, no significant difference with muscle relaxers, heat, ice helps when pt has significant muscle spasms, massage (self-massage and pt has had professional massage as well), minimal benefit from PT multiple years ago.  24-hour pain behavior: pain is worse at the end of the day History of prior neck injury, pain, surgery, or therapy: No, no surgery or notable trauma to neck Falls: Has patient fallen in last 6 months? 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  Recent memory is intact.  Remote memory is intact.  Attention  span and concentration are intact.  Expressive speech is intact.  Patient's fund of knowledge is within normal limits for educational level.    Gross Musculoskeletal Assessment Tremor: None Bulk: Normal Tone: Normal  Gait Deferred  Posture Upper thoracic kyphosis noted with forward head and rounded shoulders. Unable to correct with cues. Limited spontaneous cervical motion noted  AROM AROM (Normal range in degrees) AROM 07/19/2022  Cervical  Flexion (50) 25  Extension (80) 8  Right lateral flexion (45) 10  Left lateral flexion (45) 6  Right rotation (85) 15  Left rotation (85) 16  (* = pain; Blank rows = not tested)  MMT MMT (out of 5) Right 07/19/2022 Left 07/19/2022  Cervical (isometric)  Flexion WNL  Extension WNL  Lateral Flexion WNL WNL  Rotation WNL WNL      Shoulder   Flexion 5 5  Extension    Abduction 5 5  Internal rotation    Horizontal abduction    Horizontal adduction    Lower Trapezius    Rhomboids        Elbow  Flexion 5 5  Extension 5 5  Pronation    Supination        Wrist  Flexion 5 5  Extension 5 5  Radial deviation    Ulnar deviation        MCP  Flexion 5 5  Extension 5 5  Abduction 5 5  Adduction 5 5  (* = pain; Blank rows = not tested)  Sensation Deferred  Reflexes Deferred  Palpation  Location LEFT  RIGHT           Suboccipitals 0 0  Cervical paraspinals 0 0  Upper Trapezius 0 0  Levator Scapulae 0 0  Rhomboid Major/Minor 0 0  (Blank rows = not tested) Graded on 0-4 scale (0 = no pain, 1 = pain, 2 = pain with wincing/grimacing/flinching, 3 = pain with withdrawal, 4 = unwilling to allow palpation), (Blank rows = not tested)  Repeated Movements Deferred   Passive Accessory Intervertebral Motion Deferred  SPECIAL TESTS Spurlings A (ipsilateral lateral flexion/axial compression): R: Negative L: Negative Spurlings B (ipsilateral lateral flexion/contralateral rotation/axial compression): R: Negative L:  Negative Distraction Test: Not examined  Hoffman Sign (cervical  cord compression): R: Not examined L: Not examined ULTT Median: R: Not examined L: Not examined ULTT Ulnar: R: Not examined L: Not examined ULTT Radial: R: Not examined L: Not examined   TODAY'S TREATMENT    SUBJECTIVE: Pt reports that he is doing well today. No excessive muscle soreness after the last therapy session and no spasms. He feels like he was able to gain more range of motion after last session with the additional stretches. No pain upon arrival. No specific questions or concerns.    PAIN: Denies   Trigger Point Dry Needling (TDN), unbilled Education performed with patient regarding potential benefit of TDN. Previously reviewed precautions and risks with patient. Pt provided verbal consent to treatment. In supine using clean technique TDN performed to bilateral upper traps with 4, 0.25 x 40 single needle placements (2 on each side) with local twitch response (LTR) during all placements. Pistoning technique utilized;   Manual Therapy Supine moist heat pack to cervical spine during interval history at start of session; Supine MET cervical lateral flexion and rotation stretches (5s contract/5s relax) gradually progressing into more ROM each direction bilaterally; Supine upper trap and levator scapulae stretch x 45s each bilaterally; Supine resisted cervical rotation to improve ROM x 5 each direction;   Mechanical Traction Before, and separate from, manual therapy, performed mechanical cervical traction with pt in supine, 20-30 degree pull angle with Saunders unit. Utilized 30# x 10 minutes with therapist constantly monitoring patient. Pt denies any pain during or after traction;   PATIENT EDUCATION:  Education details: Plan of care, reinforced importance of home stretches; Person educated: Patient Education method: Explanation Education comprehension: verbalized understanding   HOME EXERCISE  PROGRAM: Access Code: [email or web address] URL: [email or web address] Date: 07/26/2022 Prepared by: Roxana Hires  Exercises - Seated Cervical Retraction  - 1-2 x daily - 7 x weekly - 2 sets - 10 reps - 3s hold - Seated Scapular Retraction  - 1-2 x daily - 7 x weekly - 2 sets - 10 reps - 3s hold - Seated Cervical Sidebending Stretch  - 4-6 x daily - 7 x weekly - 2-3 reps - 45-60s hold - Seated Assisted Cervical Rotation with Towel  - 4-6 x daily - 7 x weekly - 2-3 reps - 45-60s hold   ASSESSMENT:  CLINICAL IMPRESSION: Progressed cervical range of motion today with manual techniques and dry needling. Utilized mechanical traction again today as appears to have better ROM when utilizing mechanical traction at the beginning of sessions. No HEP updates on this date but reinforced the importance of stretching at home. Pt encouraged to follow-up as scheduled. He will benefit from skilled PT to address impairments in cervical range of motion and pain in order to improve function at home, work, and with leisure activities.  REHAB POTENTIAL: Fair    CLINICAL DECISION MAKING: Evolving/moderate complexity  EVALUATION COMPLEXITY: Moderate   GOALS: Goals reviewed with patient? Yes  SHORT TERM GOALS: Target date: 08/16/2022   Pt will be independent with HEP to improve strength and decrease neck pain to improve pain-free function at home and work. Baseline:  Goal status: INITIAL  LONG TERM GOALS: Target date: 09/13/2022   Pt will increase FOTO to at least 61 to demonstrate significant improvement in function at home and work related to neck pain  Baseline: 07/19/22: 49 Goal status: INITIAL  2.  Pt will decrease worst neck pain by at least 2 points on the NPRS in order to demonstrate clinically significant reduction in neck pain. Baseline:  07/19/22: 10/10; Goal status: INITIAL  3.  Pt will decrease NDI score by at least 19% in order demonstrate clinically significant reduction in neck  pain/disability.       Baseline: 07/19/22: To be completed; 07/26/22: 10%; Goal status: DISCONTINUED  4.  Pt will increase cervical extension, bilateral rotation, and bilateral lateral flexion by at least 10 degrees each in order to improve his ability to work and drive with less pain Baseline: 07/19/22: extension: 8, lateral flexion (R/L): 10/6, rotation (R/L): 15/16; Goal status: INITIAL   PLAN: PT FREQUENCY: 1-2x/week  PT DURATION: 8 weeks  PLANNED INTERVENTIONS: Therapeutic exercises, Therapeutic activity, Neuromuscular re-education, Balance training, Gait training, Patient/Family education, Joint manipulation, Joint mobilization, Vestibular training, Canalith repositioning, Dry Needling, Electrical stimulation, Spinal manipulation, Spinal mobilization, Cryotherapy, Moist heat, Taping, Traction, Ultrasound, Ionotophoresis '4mg'$ /ml Dexamethasone, and Manual therapy  PLAN FOR NEXT SESSION:  Update outcome measures, goals, progress note, progress manual techniques for ROM, TDN, review and modify HEP as necessary  Lyndel Safe Suren Payne PT, DPT, GCS  Kalana Yust 09/01/2022, 12:35 PM

## 2022-09-04 ENCOUNTER — Encounter (HOSPITAL_COMMUNITY)
Admission: RE | Disposition: A | Discharge status: Home / Self Care | Payer: Self-pay | Source: Home / Self Care | Attending: Cardiology

## 2022-09-04 ENCOUNTER — Ambulatory Visit (HOSPITAL_COMMUNITY): Payer: BC Managed Care – PPO | Admitting: Anesthesiology

## 2022-09-04 ENCOUNTER — Ambulatory Visit (HOSPITAL_COMMUNITY)
Admission: RE | Admit: 2022-09-04 | Discharge: 2022-09-04 | Disposition: A | Discharge status: Home / Self Care | Payer: BC Managed Care – PPO | Attending: Cardiology | Admitting: Cardiology

## 2022-09-04 ENCOUNTER — Other Ambulatory Visit (HOSPITAL_COMMUNITY): Payer: Self-pay

## 2022-09-04 ENCOUNTER — Other Ambulatory Visit: Payer: Self-pay

## 2022-09-04 DIAGNOSIS — I480 Paroxysmal atrial fibrillation: Secondary | ICD-10-CM | POA: Diagnosis present

## 2022-09-04 DIAGNOSIS — I10 Essential (primary) hypertension: Secondary | ICD-10-CM | POA: Insufficient documentation

## 2022-09-04 DIAGNOSIS — G4733 Obstructive sleep apnea (adult) (pediatric): Secondary | ICD-10-CM | POA: Insufficient documentation

## 2022-09-04 DIAGNOSIS — Z79899 Other long term (current) drug therapy: Secondary | ICD-10-CM | POA: Diagnosis not present

## 2022-09-04 DIAGNOSIS — I4819 Other persistent atrial fibrillation: Secondary | ICD-10-CM | POA: Diagnosis not present

## 2022-09-04 HISTORY — PX: ATRIAL FIBRILLATION ABLATION: EP1191

## 2022-09-04 LAB — POCT ACTIVATED CLOTTING TIME
Activated Clotting Time: 331 seconds
Activated Clotting Time: 331 seconds
Activated Clotting Time: 336 seconds

## 2022-09-04 SURGERY — ATRIAL FIBRILLATION ABLATION
Anesthesia: General

## 2022-09-04 MED ORDER — SODIUM CHLORIDE 0.9% FLUSH: 3.0000 mL | INTRAVENOUS | Status: DC | PRN

## 2022-09-04 MED ORDER — ONDANSETRON HCL 4 MG/2ML IJ SOLN: 4.0000 mg | Freq: Four times a day (QID) | INTRAMUSCULAR | Status: DC | PRN

## 2022-09-04 MED ORDER — ROCURONIUM BROMIDE 10 MG/ML (PF) SYRINGE
PREFILLED_SYRINGE | INTRAVENOUS | Status: DC | PRN
  Administered 2022-09-04: 20 mg via INTRAVENOUS
  Administered 2022-09-04: 50 mg via INTRAVENOUS

## 2022-09-04 MED ORDER — COLCHICINE 0.6 MG PO TABS
0.6000 mg | ORAL_TABLET | Freq: Two times a day (BID) | ORAL | Status: DC
  Administered 2022-09-04: 0.6 mg via ORAL
  Filled 2022-09-04: qty 1

## 2022-09-04 MED ORDER — SUCCINYLCHOLINE CHLORIDE 200 MG/10ML IV SOSY
PREFILLED_SYRINGE | INTRAVENOUS | Status: DC | PRN
  Administered 2022-09-04: 160 mg via INTRAVENOUS

## 2022-09-04 MED ORDER — PROTAMINE SULFATE 10 MG/ML IV SOLN
INTRAVENOUS | Status: DC | PRN
  Administered 2022-09-04: 35 mg via INTRAVENOUS

## 2022-09-04 MED ORDER — SODIUM CHLORIDE 0.9 % IV SOLN: INTRAVENOUS | Status: DC

## 2022-09-04 MED ORDER — HEPARIN SODIUM (PORCINE) 1000 UNIT/ML IJ SOLN
INTRAMUSCULAR | Status: DC | PRN
  Administered 2022-09-04: 2000 [IU] via INTRAVENOUS
  Administered 2022-09-04: 3000 [IU] via INTRAVENOUS
  Administered 2022-09-04: 18000 [IU] via INTRAVENOUS

## 2022-09-04 MED ORDER — MIDAZOLAM HCL 2 MG/2ML IJ SOLN
INTRAMUSCULAR | Status: DC | PRN
  Administered 2022-09-04: 2 mg via INTRAVENOUS

## 2022-09-04 MED ORDER — SUGAMMADEX SODIUM 200 MG/2ML IV SOLN
INTRAVENOUS | Status: DC | PRN
  Administered 2022-09-04: 200 mg via INTRAVENOUS

## 2022-09-04 MED ORDER — COLCHICINE 0.6 MG PO TABS
0.6000 mg | ORAL_TABLET | Freq: Two times a day (BID) | ORAL | 0 refills | Status: DC
  Filled 2022-09-04: qty 10, 5d supply, fill #0

## 2022-09-04 MED ORDER — HEPARIN (PORCINE) IN NACL 1000-0.9 UT/500ML-% IV SOLN
INTRAVENOUS | Status: DC | PRN
  Administered 2022-09-04 (×3): 500 mL

## 2022-09-04 MED ORDER — PANTOPRAZOLE SODIUM 40 MG PO TBEC
40.0000 mg | DELAYED_RELEASE_TABLET | Freq: Every day | ORAL | Status: DC
  Administered 2022-09-04: 40 mg via ORAL
  Filled 2022-09-04: qty 1

## 2022-09-04 MED ORDER — SODIUM CHLORIDE 0.9% FLUSH: 3.0000 mL | Freq: Two times a day (BID) | INTRAVENOUS | Status: DC

## 2022-09-04 MED ORDER — DEXAMETHASONE SODIUM PHOSPHATE 10 MG/ML IJ SOLN
INTRAMUSCULAR | Status: DC | PRN
  Administered 2022-09-04: 10 mg via INTRAVENOUS

## 2022-09-04 MED ORDER — SODIUM CHLORIDE 0.9 % IV SOLN: 250.0000 mL | INTRAVENOUS | Status: DC | PRN

## 2022-09-04 MED ORDER — FENTANYL CITRATE (PF) 250 MCG/5ML IJ SOLN
INTRAMUSCULAR | Status: DC | PRN
  Administered 2022-09-04: 100 ug via INTRAVENOUS

## 2022-09-04 MED ORDER — PROPOFOL 10 MG/ML IV BOLUS
INTRAVENOUS | Status: DC | PRN
  Administered 2022-09-04: 200 mg via INTRAVENOUS

## 2022-09-04 MED ORDER — LIDOCAINE 2% (20 MG/ML) 5 ML SYRINGE
INTRAMUSCULAR | Status: DC | PRN
  Administered 2022-09-04: 80 mg via INTRAVENOUS

## 2022-09-04 MED ORDER — APIXABAN 5 MG PO TABS
5.0000 mg | ORAL_TABLET | Freq: Two times a day (BID) | ORAL | Status: DC
  Administered 2022-09-04: 5 mg via ORAL
  Filled 2022-09-04: qty 1

## 2022-09-04 MED ORDER — PANTOPRAZOLE SODIUM 40 MG PO TBEC
40.0000 mg | DELAYED_RELEASE_TABLET | Freq: Every day | ORAL | 0 refills | Status: DC
  Filled 2022-09-04: qty 42, 42d supply, fill #0

## 2022-09-04 MED ORDER — HEPARIN SODIUM (PORCINE) 1000 UNIT/ML IJ SOLN
INTRAMUSCULAR | Status: DC | PRN
  Administered 2022-09-04: 1000 [IU] via INTRAVENOUS

## 2022-09-04 MED ORDER — ACETAMINOPHEN 325 MG PO TABS: 650.0000 mg | ORAL_TABLET | ORAL | Status: DC | PRN

## 2022-09-04 MED ORDER — ONDANSETRON HCL 4 MG/2ML IJ SOLN
INTRAMUSCULAR | Status: DC | PRN
  Administered 2022-09-04: 4 mg via INTRAVENOUS

## 2022-09-04 SURGICAL SUPPLY — 19 items
BAG SNAP BAND KOVER 36X36 (MISCELLANEOUS) IMPLANT
CATH ABLAT QDOT MICRO BI TC DF (CATHETERS) IMPLANT
CATH GE 8FR SOUNDSTAR (CATHETERS) IMPLANT
CATH OCTARAY 2.0 F 3-3-3-3-3 (CATHETERS) IMPLANT
CATH S-M CIRCA TEMP PROBE (CATHETERS) IMPLANT
CATH WEB BI DIR CSDF CRV REPRO (CATHETERS) IMPLANT
CLOSURE PERCLOSE PROSTYLE (VASCULAR PRODUCTS) IMPLANT
COVER SWIFTLINK CONNECTOR (BAG) ×1 IMPLANT
MAT PREVALON FULL STRYKER (MISCELLANEOUS) IMPLANT
PACK EP LATEX FREE (CUSTOM PROCEDURE TRAY) ×1
PACK EP LF (CUSTOM PROCEDURE TRAY) ×1 IMPLANT
PAD DEFIB RADIO PHYSIO CONN (PAD) ×1 IMPLANT
PATCH CARTO3 (PAD) IMPLANT
SHEATH BAYLIS TRANSSEPTAL 98CM (NEEDLE) IMPLANT
SHEATH CARTO VIZIGO SM CVD (SHEATH) IMPLANT
SHEATH PINNACLE 8F 10CM (SHEATH) IMPLANT
SHEATH PINNACLE 9F 10CM (SHEATH) IMPLANT
SHEATH PROBE COVER 6X72 (BAG) IMPLANT
TUBING SMART ABLATE COOLFLOW (TUBING) IMPLANT

## 2022-09-04 NOTE — Anesthesia Procedure Notes (Signed)
Procedure Name: Intubation Date/Time: 09/04/2022 10:37 AM  Performed by: Mosetta Pigeon, CRNAPre-anesthesia Checklist: Patient identified, Emergency Drugs available, Suction available and Patient being monitored Patient Re-evaluated:Patient Re-evaluated prior to induction Oxygen Delivery Method: Circle System Utilized Preoxygenation: Pre-oxygenation with 100% oxygen Induction Type: IV induction Ventilation: Oral airway inserted - appropriate to patient size and Two handed mask ventilation required Laryngoscope Size: Glidescope and 4 Grade View: Grade II Tube type: Oral Tube size: 7.5 mm Number of attempts: 1 Airway Equipment and Method: Oral airway, Rigid stylet and Video-laryngoscopy Placement Confirmation: ETT inserted through vocal cords under direct vision, positive ETCO2 and breath sounds checked- equal and bilateral Secured at: 23 cm Tube secured with: Tape Dental Injury: Teeth and Oropharynx as per pre-operative assessment  Comments: Recommend video laryngoscopy for future intubations -- limited neck mobility, short thyromental distance, and small mouth opening.  Atraumatic intubation with Glidescope S4 -- Grade II view

## 2022-09-04 NOTE — Anesthesia Preprocedure Evaluation (Addendum)
Anesthesia Evaluation  Patient identified by MRN, date of birth, ID band Patient awake    Reviewed: Allergy & Precautions, NPO status , Patient's Chart, lab work & pertinent test results  Airway Mallampati: III  TM Distance: <3 FB Neck ROM: Limited  Mouth opening: Limited Mouth Opening  Dental no notable dental hx.    Pulmonary sleep apnea    Pulmonary exam normal        Cardiovascular hypertension, Pt. on medications + dysrhythmias Atrial Fibrillation  Rhythm:Irregular Rate:Normal  ECHO 01/24:  1. Left ventricular ejection fraction, by estimation, is 60 to 65%. The  left ventricle has normal function. The left ventricle has no regional  wall motion abnormalities. Left ventricular diastolic parameters are  consistent with Grade I diastolic  dysfunction (impaired relaxation). The average left ventricular global  longitudinal strain is -24.7 %. The global longitudinal strain is normal.   2. Right ventricular systolic function is normal. The right ventricular  size is normal.   3. Left atrial size was mildly dilated.   4. The mitral valve is normal in structure. No evidence of mitral valve  regurgitation. No evidence of mitral stenosis.   5. The aortic valve has an indeterminant number of cusps. Aortic valve  regurgitation is not visualized. No aortic stenosis is present.   6. There is mild dilatation of the ascending aorta, measuring 40 mm.   7. The inferior vena cava is normal in size with greater than 50%  respiratory variability, suggesting right atrial pressure of 3 mmHg.      Neuro/Psych negative neurological ROS  negative psych ROS   GI/Hepatic negative GI ROS, Neg liver ROS,,,  Endo/Other  negative endocrine ROS    Renal/GU negative Renal ROS  negative genitourinary   Musculoskeletal negative musculoskeletal ROS (+)    Abdominal Normal abdominal exam  (+)   Peds  Hematology negative hematology ROS (+)    Anesthesia Other Findings   Reproductive/Obstetrics                             Anesthesia Physical Anesthesia Plan  ASA: 3  Anesthesia Plan: General   Post-op Pain Management:    Induction: Intravenous  PONV Risk Score and Plan: 2 and Ondansetron, Dexamethasone, Midazolam and Treatment may vary due to age or medical condition  Airway Management Planned: Mask, Oral ETT and Video Laryngoscope Planned  Additional Equipment: None  Intra-op Plan:   Post-operative Plan: Extubation in OR  Informed Consent: I have reviewed the patients History and Physical, chart, labs and discussed the procedure including the risks, benefits and alternatives for the proposed anesthesia with the patient or authorized representative who has indicated his/her understanding and acceptance.     Dental advisory given  Plan Discussed with:   Anesthesia Plan Comments:        Anesthesia Quick Evaluation

## 2022-09-04 NOTE — H&P (Signed)
Electrophysiology Office Follow up Visit Note:     Date:  09/04/2022    ID:  Jose Garcia, DOB 20-Jun-1961, MRN [id number]   PCP:  Olin Hauser, DO            [place] Cardiologist:  None  [facility] Electrophysiologist:  None      Interval History:     Jose Garcia is a 62 y.o. male who presents for a follow up visit. They were last seen in clinic 05/04/2022 by Dr Caryl Comes. The patient has been previously maintained on flecainide. He presents today to discuss catheter ablation as a mechanism to avoid long term AAD use.   He is with his wife today in clinic.   Presents for PVI today.    Objective      Past Medical History:  Diagnosis Date   Atrial fibrillation ([place])     Cardiomyopathy      Resolved 05/03/09   Hyperlipidemia     Hypertension 03/03/96   Sleep apnea             Past Surgical History:  Procedure Laterality Date   CARDIOVERSION          Current Medications: Active Medications      Current Meds  Medication Sig   diltiazem (CARDIZEM CD) 120 MG 24 hr capsule TAKE 1 CAPSULE BY MOUTH DAILY.   flecainide (TAMBOCOR) 100 MG tablet Take 1 tablet (100 mg total) by mouth 2 (two) times daily.   fluticasone (FLONASE) 50 MCG/ACT nasal spray Place 2 sprays into both nostrils daily. Use for 4-6 weeks then stop and use seasonally or as needed.   furosemide (LASIX) 20 MG tablet Take 1 tablet (20 mg) by mouth x 4 doses over the next week, then take 1 tablet daily as needed for lower extremity swelling   hydrochlorothiazide (HYDRODIURIL) 25 MG tablet TAKE 1 TABLET(25 MG) BY MOUTH DAILY   ipratropium (ATROVENT) 0.06 % nasal spray USE 2 SPRAYS IN EACH NOSTRIL AT BEDTIME AS NEEDED FOR RHINITIS   loratadine (CLARITIN) 10 MG tablet Take 10 mg by mouth daily as needed for allergies.   losartan (COZAAR) 100 MG tablet Take 1 tablet (100 mg total) by mouth daily.   ranolazine (RANEXA) 500 MG 12 hr tablet Take 1 tablet (500 mg total) by mouth 2 (two) times daily.         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    Social Determinants of Health    Financial Resource Strain: Not on file  Food Insecurity: Not on file  Transportation Needs: Not on file  Physical Activity: Not on file  Stress: Not on file  Social Connections: Not on file      Family History: The patient's family history includes Cancer in his mother; Heart disease in his mother; Hypertension in his father and mother. There is no history of Diabetes, Coronary artery disease, Colon cancer, or Prostate cancer.   ROS:   Please see the history of present illness.    All other systems reviewed and are negative.   EKGs/Labs/Other Studies Reviewed:     The following studies were reviewed today:   09/2014 echo - EF 55%   EKG today shows sinus rhythm, incomplete right bundle branch block with a QRS duration of 108 ms and a PR interval of 168 ms   Recent Labs: 11/09/2021: ALT 21; BUN 15; Creat 0.92; Hemoglobin 14.6; Platelets 152; Potassium 4.1; Sodium 141; TSH 1.85  Recent Lipid Panel Labs (Brief)          Component Value Date/Time    CHOL 191 11/09/2021 0859    CHOL 184 06/15/2016 0755    TRIG 68 11/09/2021 0859    HDL 53 11/09/2021 0859    HDL 45 06/15/2016 0755    CHOLHDL 3.6 11/09/2021 0859    VLDL 18 12/31/2007 1527    LDLCALC 122 (H) 11/09/2021 0859         Physical Exam:     VS:  BP 168/88   Pulse 70   Ht 6' (1.829 m)   Wt 285 lb (129.3 kg)   SpO2 97%   BMI 38.65 kg/m         Wt Readings from Last 3 Encounters:  05/31/22 285 lb (129.3 kg)  05/04/22 285 lb 8 oz (129.5 kg)  12/20/21 280 lb (127 kg)      GEN:  Well nourished, well developed in no acute distress.  Obese HEENT: Normal NECK: No JVD; No carotid bruits LYMPHATICS: No lymphadenopathy CARDIAC: RRR, no murmurs, rubs, gallops RESPIRATORY:  Clear to auscultation without rales, wheezing or rhonchi  ABDOMEN: Soft, non-tender, non-distended MUSCULOSKELETAL:  No edema; No deformity  SKIN: Warm and dry NEUROLOGIC:  Alert and oriented x 3 PSYCHIATRIC:  Normal affect            Assessment ASSESSMENT:     1. Paroxysmal atrial fibrillation (HCC)   2. OSA on CPAP   3. Primary hypertension     PLAN:     In order of problems listed above:     #AF Symptomatic. treated with flecainide/ranolazine. Not on [place] due to CHADSVASc of 1.   Discussed treatment options today for their AF including antiarrhythmic drug therapy and ablation. Discussed risks, recovery and likelihood of success. Discussed potential need for repeat ablation procedures and antiarrhythmic drugs after an initial ablation. They wish to proceed with scheduling.   Risk, benefits, and alternatives to EP study and radiofrequency ablation for afib were also discussed in detail today. These risks include but are not limited to stroke, bleeding, vascular damage, tamponade, perforation, damage to the esophagus, lungs, and other structures, pulmonary vein stenosis, worsening renal function, and death. The patient understands these risk and wishes to proceed.  We will therefore proceed with catheter ablation at the next available time.  Carto, ICE, anesthesia are requested for the procedure.  Will also obtain CT PV protocol prior to the procedure to exclude LAA thrombus and further evaluate atrial anatomy.   Plan  to start eliquis 4 weeks prior to ablation and continue for at least 6 months post ablation.   Will need echo prior to procedure.  Will keep flecainide and ranolazine both on until 3 months after the ablation.  If he has had no recurrence of arrhythmia during the 90 days after ablation, plan to stop ranolazine.  Would stop flecainide 4 months after the ablation.    Presents today for PVI. Procedure reviewed.   Signed, Lars Mage, MD, [facility], [facility] 09/04/2022 Electrophysiology [facility]

## 2022-09-04 NOTE — Transfer of Care (Signed)
Immediate Anesthesia Transfer of Care Note  Patient: Jose Garcia  Procedure(s) Performed: ATRIAL FIBRILLATION ABLATION  Patient Location: Cath Lab  Anesthesia Type:General  Level of Consciousness: drowsy and patient cooperative  Airway & Oxygen Therapy: Patient Spontanous Breathing and Patient connected to nasal cannula oxygen  Post-op Assessment: Report given to RN and Post -op Vital signs reviewed and stable  Post vital signs: Reviewed and stable  Last Vitals:  Vitals Value Taken Time  BP 157/85 09/04/22 1247  Temp    Pulse 77 09/04/22 1247  Resp 18 09/04/22 1247  SpO2 92 % 09/04/22 1247  Vitals shown include unvalidated device data.  Last Pain:  Vitals:   09/04/22 0906  TempSrc:   PainSc: 0-No pain      Patients Stated Pain Goal: 4 ([id number])  Complications: There were no known notable events for this encounter.

## 2022-09-04 NOTE — Anesthesia Postprocedure Evaluation (Signed)
Anesthesia Post Note  Patient: Jose Garcia  Procedure(s) Performed: ATRIAL FIBRILLATION ABLATION     Patient location during evaluation: PACU Anesthesia Type: General Level of consciousness: awake and alert Pain management: pain level controlled Vital Signs Assessment: post-procedure vital signs reviewed and stable Respiratory status: spontaneous breathing, nonlabored ventilation, respiratory function stable and patient connected to nasal cannula oxygen Cardiovascular status: blood pressure returned to baseline and stable Postop Assessment: no apparent nausea or vomiting Anesthetic complications: no  There were no known notable events for this encounter.  Last Vitals:  Vitals:   09/04/22 1255 09/04/22 1300  BP: (!) 157/81 (!) 153/84  Pulse: 74 73  Resp: 15 16  Temp:    SpO2: 92% 95%    Last Pain:  Vitals:   09/04/22 1248  TempSrc: Temporal  PainSc: Asleep                 Sheryl Towell S

## 2022-09-04 NOTE — Discharge Instructions (Signed)

## 2022-09-05 ENCOUNTER — Encounter (HOSPITAL_COMMUNITY): Payer: Self-pay | Admitting: Cardiology

## 2022-09-18 NOTE — Therapy (Signed)
OUTPATIENT PHYSICAL THERAPY NECK TREATMENT/RECERTIFICATION/PROGRESS NOTE  Dates of reporting period  07/19/22   to   09/20/22   Patient Name: Jose Garcia MRN: [id number] DOB:1960-10-01, 62 y.o., male Today's Date: 09/21/2022   PT End of Session - 09/21/22 2228     Visit Number 10    Number of Visits 33    Date for PT Re-Evaluation 11/15/22    Authorization Type eval: 07/19/22, recertification: 09/20/22;    PT Start Time 1310    PT Stop Time 1355    PT Time Calculation (min) 45 min    Activity Tolerance Patient tolerated treatment well    Behavior During Therapy [facility] for tasks assessed/performed            Past Medical History:  Diagnosis Date   Atrial fibrillation (HCC)    Cardiomyopathy    Resolved 05/03/09   Hyperlipidemia    Hypertension 03/03/96   Sleep apnea    Past Surgical History:  Procedure Laterality Date   ATRIAL FIBRILLATION ABLATION N/A 09/04/2022   Procedure: ATRIAL FIBRILLATION ABLATION;  Surgeon: Lanier Prude, MD;  Location: [facility] INVASIVE [facility] LAB;  Service: Cardiovascular;  Laterality: N/A;   CARDIOVERSION     Patient Active Problem List   Diagnosis Date Noted   OSA on CPAP 10/08/2019   Post-nasal drainage 10/08/2019   Morbid obesity (HCC) 05/05/2015   ED (erectile dysfunction) 05/05/2015   Cardiomyopathy (HCC) 11/06/2014   EDEMA 01/07/2010   Paroxysmal atrial fibrillation (HCC) 01/22/2009   HYPERCHOLESTEROLEMIA 12/31/2007   Essential hypertension 12/31/2007   PCP: Smitty Cords, DO  REFERRING PROVIDER: Ronne Binning, NP  REFERRING DIAGNOSIS: Cervicalgia  THERAPY DIAG: Cervicalgia  RATIONALE FOR EVALUATION AND TREATMENT: Rehabilitation  ONSET DATE: Approximately 10-12 years ago  FOLLOW UP APPT WITH PROVIDER: Yes, plans to see MD for a physical this year  FROM INITIAL EVALUATION SUBJECTIVE:                                                                                                                                                                                          Chief Complaint: Neck pain and limited motion;  Pertinent History Pt referred to physical therapy for limited neck mobility and pain. Mobility deficits have progressed for the last 10-12 years. He had seen chiropractor in the past but is not seeing one currently. No prior history of head or neck surgery. He has had both a cervical CT and MRI which showed multilevel degenerative changes of the cervical spine. This included OPLL with resulting severe spinal canal stenosis and cord compression at C3-C4 and C4-C5 without underlying cord signal abnormality. Foraminal narrowing, severe  on the right at C3-C4 and C4-C5 and moderate at other levels as above. Large, bulky osteophytes along the anterior aspect of the upper cervical spine extending from approximately C1-C5. There is mass effect on the posterior aspect of the oropharynx. Pt is having difficulty swallowing and reports that his esophagus feels "closed up." Pt has consulted with neurosurgery but per pt report none of the surgeons have encouraged him to proceed with surgery given the high risk of worsening his symptoms.  MRI CERVICAL SPINE WITHOUT CONTRAST (01/20/22) FINDINGS:  Alignment: Normal alignment  Bone marrow Signal: no suspicious lesions  Spinal cord: Normal signal. There is complete effacement of the anterior  and posterior CSF space at the C3-C4 (9:24) and C4-C5 (9:18) levels  resulting in cord compression. There is no underlying cord signal  abnormality.  Flowing anterior osteophytes consistent with DISH. OPLL.  Paraspinal Soft Tissues: Mild prevertebral edema.  Occiput-C1: Mild degenerative changes.  [place]-dental interval: Degenerative narrowing  C1-2 lateral masses: no significant degenerative change  C2-C3: Disc osteophyte complex. Moderate facet arthrosis, right greater  than left. Mild spinal canal stenosis. No foraminal narrowing.  C3-C4: Disc osteophyte complex. Severe spinal canal  stenosis and cord  compression. Severe right and moderate left foraminal narrowing.  C4-C5: OPLL. Severe spinal canal stenosis and cord compression. Severe  right and mild left foraminal narrowing.  C5-C6: OPLL. Moderate spinal canal stenosis. Mild bilateral foraminal  narrowing.  C6-C7: OPLL. Moderate spinal canal stenosis. No foraminal narrowing.  C7-T1: no significant stenosis   IMPRESSION:  1. Multilevel degenerative changes of the cervical spine. OPLL with  resulting severe spinal canal stenosis and cord compression at C3-C4 and  C4-C5. No underlying cord signal abnormality.  2.  Foraminal narrowing, severe on the right at C3-C4 and C4-C5 and  moderate at other levels as above.   Procedure: CT CERVICAL SPINE WITHOUT CONTRAST (01/20/22) CERVICAL SPINE FINDINGS:  Alignment: Normal.  Occipital Condyles: Intact.  Bones: No fractures or facet dislocations.  Intervertebral Discs: Normal.  Prevertebral soft tissues: No prevertebral soft tissue swelling.  Regional Soft Tissues: The regional soft tissues are unremarkable.  Lung Apices: The partially visualized lung apices are clear.  Skull Base: The partially imaged skull base is unremarkable.  Degenerative changes:  Large, bulky osteophytes along the anterior aspect of the upper cervical  spine extending from approximately C1-C5. There is mass effect on the  posterior aspect of the oropharynx. There is cervical spine DISH and OPLL  extending from C4-T1.  C1-C2: Extensive heterotopic osseous formation about the atlantodens  interval.  C2-C3: Uncovertebral degenerative changes. No high-grade spinal canal  stenosis.  C3-C4: Uncovertebral and facet degenerative changes. OPLL results in severe  canal stenosis.  C4-C5: Uncovertebral and facet degenerative changes as well as OPLL,  resulting in severe canal stenosis.  C5-C6: Facet and uncovertebral degenerative changes. OPLL.  C6-C7: Facet and uncovertebral degenerative change. OPLL.  Mild canal  stenosis.  C7-T1: No high-grade spinal canal or neuroforaminal stenosis.   IMPRESSION:  Extensive degenerative changes of the cervical spine with multilevel DISH  and OPLL. There is severe spinal canal stenosis at C3-C4 and C4-C5 better  evaluated on same day MRI.   Pain:  Pain Intensity: Present: 0/10, Best: 0/10, Worst: 10/10 Pain location: R upper trap extending to the inside of the R shoulder blade, tightness on the left side of neck; Pain Quality: general tightness and muscle strain Radiating: Yes, Pain occasionally radiates from the R side of his neck to the top of his R  shoulder; Numbness/Tingling: Yes, R side of neck and top of R shoulder, 2x/month numbness in R hand Focal Weakness: No Aggravating factors: Getting into/out of cars due to laterally flexing and rotating his head (takes a day or two to recover), most activities increase his pain, no real change with TENS Relieving factors: rest after strain, Tylenol/ibuprofen, no significant difference with muscle relaxers, heat, ice helps when pt has significant muscle spasms, massage (self-massage and pt has had professional massage as well), minimal benefit from PT multiple years ago.  24-hour pain behavior: pain is worse at the end of the day History of prior neck injury, pain, surgery, or therapy: No, no surgery or notable trauma to neck Falls: Has patient fallen in last 6 months? 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  Recent memory is intact.  Remote memory is intact.  Attention span and concentration are intact.  Expressive speech is intact.  Patient's fund of knowledge is within normal limits for educational level.    Gross Musculoskeletal Assessment Tremor: None Bulk: Normal Tone: Normal  Gait Deferred  Posture Upper thoracic kyphosis noted with forward head and rounded shoulders. Unable to correct with cues. Limited spontaneous cervical motion noted  AROM AROM (Normal range in degrees) AROM 07/19/2022  Cervical  Flexion (50) 25  Extension (80) 8  Right lateral flexion (45) 10  Left lateral flexion (45) 6  Right rotation (85) 15  Left rotation (85) 16  (* = pain; Blank rows = not tested)  MMT MMT (out of 5) Right 07/19/2022 Left 07/19/2022  Cervical (isometric)  Flexion WNL  Extension WNL  Lateral Flexion WNL WNL  Rotation WNL WNL      Shoulder   Flexion 5 5  Extension    Abduction 5 5  Internal rotation    Horizontal abduction    Horizontal adduction    Lower Trapezius    Rhomboids        Elbow  Flexion 5 5  Extension 5 5  Pronation    Supination        Wrist  Flexion 5 5  Extension 5 5  Radial deviation    Ulnar deviation        MCP  Flexion 5 5  Extension 5 5  Abduction 5 5  Adduction 5 5  (* = pain; Blank rows = not tested)  Palpation Location LEFT  RIGHT           Suboccipitals 0 0  Cervical paraspinals 0 0  Upper Trapezius 0 0  Levator Scapulae 0 0  Rhomboid Major/Minor 0 0  (Blank rows = not tested) Graded on 0-4 scale (0 = no pain, 1 = pain, 2 = pain with wincing/grimacing/flinching, 3 = pain with withdrawal, 4 = unwilling to allow palpation), (Blank rows = not tested)  SPECIAL TESTS  Spurlings A (ipsilateral  lateral flexion/axial compression): R: Negative L: Negative Spurlings B (ipsilateral lateral flexion/contralateral rotation/axial compression): R: Negative L: Negative Distraction Test: Not examined  Hoffman Sign (cervical cord compression): R: Not examined L: Not examined ULTT Median: R: Not examined L: Not examined ULTT Ulnar: R: Not examined L: Not examined ULTT Radial: R: Not examined L: Not examined   TODAY'S TREATMENT    SUBJECTIVE: Pt reports that he is doing well today. He has been away on vacation recently but notes good maintenance of his cervical range of motion. No pain upon arrival. No specific questions or concerns.    PAIN: Denies   Manual Therapy Supine MET cervical lateral flexion and rotation stretches (5s contract/5s relax) gradually progressing into more ROM each direction bilaterally; Supine upper trap and levator scapulae stretch x 45s each bilaterally; Update outcome measures: Cervical range of motion: extension: 19, lateral flexion (R/L): 28/27, rotation (R/L): 42/40; Worst pain: 5/10; FOTO: 67   Trigger Point Dry Needling (TDN), unbilled Education performed with patient regarding potential benefit of TDN. Previously reviewed precautions and risks with patient. Pt provided verbal consent to treatment. In supine using clean technique TDN performed to bilateral upper traps with 4, 0.25 x 40 single needle placements (2 on each side) with local twitch response (LTR) during all placements. Pistoning technique utilized;   Mechanical Traction Before, and separate from, manual therapy, performed mechanical cervical traction with pt in supine, 20-30 degree pull angle with Saunders unit. Utilized 30# x 10 minutes with therapist constantly monitoring patient. Pt denies any pain during or after traction;   PATIENT EDUCATION:  Education details: Plan of care, outcome measures; Person educated: Patient Education method: Explanation Education comprehension: verbalized  understanding   HOME EXERCISE PROGRAM: Access Code: [email or web address] URL: [email or web address] Date: 07/26/2022 Prepared by: Roxana Hires  Exercises - Seated Cervical Retraction  - 1-2 x daily - 7 x weekly - 2 sets - 10 reps - 3s hold - Seated Scapular Retraction  - 1-2 x daily - 7 x weekly - 2 sets - 10 reps - 3s hold - Seated Cervical Sidebending Stretch  - 4-6 x daily - 7 x weekly - 2-3 reps - 45-60s hold - Seated Assisted Cervical Rotation with Towel  - 4-6 x daily - 7 x weekly - 2-3 reps - 45-60s hold   ASSESSMENT:  CLINICAL IMPRESSION: Progressed cervical range of motion today with manual techniques and dry needling. Utilized mechanical traction again today as appears to have better ROM when utilizing mechanical traction at the beginning of sessions. Updated outcome measures and his cervical range and pain have improved considerably since starting therapy. His FOTO score improved from 49 at the initial evaluation to 67 today. No HEP updates on this date. Pt encouraged to follow-up as scheduled. He will benefit from skilled PT to address impairments in cervical range of motion and pain in order to improve function at home, work, and with leisure activities.  REHAB POTENTIAL: Fair    CLINICAL DECISION MAKING: Evolving/moderate complexity  EVALUATION COMPLEXITY: Moderate   GOALS: Goals reviewed with patient? Yes  SHORT TERM GOALS: Target date: 10/18/2022   Pt will be independent with HEP to improve strength and decrease neck pain to improve pain-free function at home and work. Baseline:  Goal status: PARTIALLY MET  LONG TERM GOALS: Target date: 11/15/2022   Pt will increase FOTO to at least 61 to demonstrate significant improvement in function at home and work related to neck pain  Baseline: 07/19/22: 49; 09/20/22:  67 Goal status: ACHIEVED  2.  Pt will decrease worst neck pain by at least 2 points on the NPRS in order to demonstrate clinically significant reduction  in neck pain. Baseline: 07/19/22: 10/10; 09/20/22: 5/10; Goal status: ACHIEVED  3.  Pt will decrease NDI score by at least 19% in order demonstrate clinically significant reduction in neck pain/disability.       Baseline: 07/19/22: To be completed; 07/26/22: 10%; Goal status: DISCONTINUED  4.  Pt will increase cervical extension, bilateral rotation, and bilateral lateral flexion by at least 20 degrees each in order to improve his ability to work and drive with less pain Baseline: 07/19/22: extension: 8, lateral flexion (R/L): 10/6, rotation (R/L): 15/16; 09/20/22: extension: 19, lateral flexion (R/L): 28/27, rotation (R/L): 42/40; Goal status: REVISED   PLAN: PT FREQUENCY: 1-2x/week  PT DURATION: 8 weeks  PLANNED INTERVENTIONS: Therapeutic exercises, Therapeutic activity, Neuromuscular re-education, Balance training, Gait training, Patient/Family education, Joint manipulation, Joint mobilization, Vestibular training, Canalith repositioning, Dry Needling, Electrical stimulation, Spinal manipulation, Spinal mobilization, Cryotherapy, Moist heat, Taping, Traction, Ultrasound, Ionotophoresis 4mg /ml Dexamethasone, and Manual therapy  PLAN FOR NEXT SESSION:  progress manual techniques for ROM, TDN, review and modify HEP as necessary  Lyndel Safe Darica Goren PT, DPT, GCS  Earlyn [place] 09/21/2022, 10:38 PM

## 2022-09-20 ENCOUNTER — Ambulatory Visit: Payer: BC Managed Care – PPO

## 2022-09-20 DIAGNOSIS — M542 Cervicalgia: Secondary | ICD-10-CM | POA: Diagnosis not present

## 2022-09-27 ENCOUNTER — Ambulatory Visit: Payer: BC Managed Care – PPO

## 2022-09-27 DIAGNOSIS — M542 Cervicalgia: Secondary | ICD-10-CM

## 2022-10-02 ENCOUNTER — Ambulatory Visit (HOSPITAL_COMMUNITY)
Admission: RE | Admit: 2022-10-02 | Discharge: 2022-10-02 | Disposition: A | Discharge status: Home / Self Care | Payer: BC Managed Care – PPO | Source: Ambulatory Visit | Attending: Physician Assistant | Admitting: Physician Assistant

## 2022-10-02 ENCOUNTER — Encounter (HOSPITAL_COMMUNITY): Payer: Self-pay | Admitting: Physician Assistant

## 2022-10-02 VITALS — BP 140/84 | HR 60 | Ht 72.0 in | Wt 284.8 lb

## 2022-10-02 DIAGNOSIS — I4819 Other persistent atrial fibrillation: Secondary | ICD-10-CM | POA: Insufficient documentation

## 2022-10-02 DIAGNOSIS — Z7182 Exercise counseling: Secondary | ICD-10-CM | POA: Insufficient documentation

## 2022-10-02 DIAGNOSIS — E785 Hyperlipidemia, unspecified: Secondary | ICD-10-CM | POA: Diagnosis not present

## 2022-10-02 DIAGNOSIS — E669 Obesity, unspecified: Secondary | ICD-10-CM | POA: Insufficient documentation

## 2022-10-02 DIAGNOSIS — I429 Cardiomyopathy, unspecified: Secondary | ICD-10-CM | POA: Insufficient documentation

## 2022-10-02 DIAGNOSIS — G4733 Obstructive sleep apnea (adult) (pediatric): Secondary | ICD-10-CM | POA: Insufficient documentation

## 2022-10-02 DIAGNOSIS — I4589 Other specified conduction disorders: Secondary | ICD-10-CM | POA: Diagnosis not present

## 2022-10-02 DIAGNOSIS — Z5181 Encounter for therapeutic drug level monitoring: Secondary | ICD-10-CM | POA: Diagnosis not present

## 2022-10-02 DIAGNOSIS — I10 Essential (primary) hypertension: Secondary | ICD-10-CM | POA: Insufficient documentation

## 2022-10-02 DIAGNOSIS — Z7901 Long term (current) use of anticoagulants: Secondary | ICD-10-CM | POA: Diagnosis not present

## 2022-10-02 DIAGNOSIS — Z79899 Other long term (current) drug therapy: Secondary | ICD-10-CM | POA: Diagnosis not present

## 2022-10-02 DIAGNOSIS — Z6838 Body mass index (BMI) 38.0-38.9, adult: Secondary | ICD-10-CM | POA: Insufficient documentation

## 2022-10-02 NOTE — Progress Notes (Signed)
Primary Care Physician: Olin Hauser, DO Primary Cardiologist: none Primary Electrophysiologist: Dr Caryl Comes Referring Physician: Dr Lynnda Shields is a 62 y.o. male with a history of HTN, HLD, OSA, atrial fibrillation who presents for follow up in the [facility].  The patient has been maintained on flecainide, followed by Dr Caryl Comes. Patient was referred to Dr Quentin Ore to discuss ablation to avoid long term AAD use. Patient underwent afib ablation with Dr Quentin Ore on 09/04/22. Patient is on Eliquis for a CHADS2VASC score of 1.  On follow up today, patient reports that he has done well since the ablation. He has had some very brief palpitations (10-15 seconds) but these are rare. He did have some throat soreness for about three weeks then resolved. He denies chest pain or groin issues.   Today, he denies symptoms of chest pain, shortness of breath, orthopnea, PND, lower extremity edema, dizziness, presyncope, syncope, snoring, daytime somnolence, bleeding, or neurologic sequela. The patient is tolerating medications without difficulties and is otherwise without complaint today.    Atrial Fibrillation Risk Factors:  he does have symptoms or diagnosis of sleep apnea. he is compliant with CPAP therapy. he does not have a history of rheumatic fever.   he has a BMI of Body mass index is 38.63 kg/m.Marland Kitchen Filed Weights   10/02/22 1113  Weight: 129.2 kg    Family History  Problem Relation Age of Onset   Hypertension Father    Hypertension Mother    Heart disease Mother    Cancer Mother    Diabetes Neg Hx    Coronary artery disease Neg Hx    Colon cancer Neg Hx    Prostate cancer Neg Hx      Atrial Fibrillation Management history:  Previous antiarrhythmic drugs: flecainide  Previous cardioversions: 2015, 2016 Previous ablations: 09/04/22 CHADS2VASC score: 1 Anticoagulation history: Eliquis   Past Medical History:  Diagnosis Date    Atrial fibrillation    Cardiomyopathy    Resolved 05/03/09   Hyperlipidemia    Hypertension 03/03/96   Sleep apnea    Past Surgical History:  Procedure Laterality Date   ATRIAL FIBRILLATION ABLATION N/A 09/04/2022   Procedure: ATRIAL FIBRILLATION ABLATION;  Surgeon: Vickie Epley, MD;  Location: [facility];  Service: Cardiovascular;  Laterality: N/A;   CARDIOVERSION      Current Outpatient Medications  Medication Sig Dispense Refill   apixaban (ELIQUIS) 5 MG TABS tablet Take 1 tablet (5 mg total) by mouth 2 (two) times daily. 180 tablet 2   diltiazem (CARDIZEM CD) 120 MG 24 hr capsule TAKE 1 CAPSULE BY MOUTH DAILY 90 capsule 3   flecainide (TAMBOCOR) 100 MG tablet Take 1 tablet (100 mg total) by mouth 2 (two) times daily. 180 tablet 3   fluticasone (FLONASE) 50 MCG/ACT nasal spray Place 2 sprays into both nostrils daily. Use for 4-6 weeks then stop and use seasonally or as needed. 48 g 3   furosemide (LASIX) 20 MG tablet Take 1 tablet (20 mg) by mouth x 4 doses over the next week, then take 1 tablet daily as needed for lower extremity swelling 30 tablet 5   hydrochlorothiazide (HYDRODIURIL) 25 MG tablet TAKE 1 TABLET(25 MG) BY MOUTH DAILY 90 tablet 3   ipratropium (ATROVENT) 0.06 % nasal spray USE 2 SPRAYS IN EACH NOSTRIL AT BEDTIME AS NEEDED FOR RHINITIS 15 mL 2   loratadine (CLARITIN) 10 MG tablet Take 10 mg by mouth daily.  losartan (COZAAR) 100 MG tablet Take 1 tablet (100 mg total) by mouth daily. 90 tablet 3   pantoprazole (PROTONIX) 40 MG tablet Take 1 tablet (40 mg total) by mouth daily. 42 tablet 0   ranolazine (RANEXA) 500 MG 12 hr tablet Take 1 tablet (500 mg total) by mouth 2 (two) times daily. 180 tablet 3   colchicine 0.6 MG tablet Take 1 tablet (0.6 mg total) by mouth 2 (two) times daily for 5 days. 10 tablet 0   No current facility-administered medications for this encounter.    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   Social Determinants of Health   Financial Resource Strain: Not on file  Food Insecurity: Not on file  Transportation Needs: Not on file  Physical Activity: Not on file  Stress: Not on file  Social Connections: Not on file  Intimate Partner Violence: Not on file     ROS- All systems are reviewed and negative except as per the HPI above.  Physical Exam: Vitals:   10/02/22 1113  BP: (!) 140/84  Pulse: 60  Weight: 129.2 kg  Height: 6' (1.829 m)    GEN- The patient is a well appearing male, alert and oriented x 3 today.   Head- normocephalic, atraumatic Eyes-  Sclera clear, conjunctiva pink Ears- hearing intact Oropharynx- clear Neck- supple  Lungs- Clear to ausculation bilaterally, normal work of breathing Heart- Regular rate and rhythm, no murmurs, rubs or gallops  GI- soft, NT, ND, + BS Extremities- no clubbing, cyanosis, or edema MS- no significant deformity or atrophy Skin- no rash or lesion Psych- euthymic mood, full affect Neuro- strength and sensation are intact  Wt Readings from Last 3 Encounters:  10/02/22 129.2 kg  09/04/22 126.1 kg  05/31/22 129.3 kg    EKG today demonstrates  SR converting to junctional beats/fusion  beats Vent. rate 60 BPM PR interval * ms QRS duration 106 ms QT/QTcB 450/450 ms  Echo 07/21/22 demonstrated 1. Left ventricular ejection fraction, by estimation, is 60 to 65%. The  left ventricle has normal function. The left ventricle has no regional  wall motion abnormalities. Left ventricular diastolic parameters are  consistent with Grade I diastolic dysfunction (impaired relaxation). The average left ventricular global longitudinal strain is -24.7 %. The global longitudinal strain is normal.   2. Right ventricular systolic function is normal. The right ventricular  size is normal.   3. Left atrial size was mildly dilated.   4. The mitral valve is normal in structure. No evidence of mitral valve  regurgitation.  No evidence of mitral stenosis.   5. The aortic valve has an indeterminant number of cusps. Aortic valve  regurgitation is not visualized. No aortic stenosis is present.   6. There is mild dilatation of the ascending aorta, measuring 40 mm.   7. The inferior vena cava is normal in size with greater than 50%  respiratory variability, suggesting right atrial pressure of 3 mmHg.    Epic records are reviewed at length today.  CHA2DS2-VASc Score = 1  The patient's score is based upon: CHF History: 0 HTN History: 1 Diabetes History: 0 Stroke History: 0 Vascular Disease History: 0 Age Score: 0 Gender Score: 0       ASSESSMENT AND PLAN: 1. Persistent Atrial Fibrillation (ICD10:  I48.19) The patient's CHA2DS2-VASc score is 1, indicating a 0.6% annual risk of stroke.   S/p afib ablation 09/04/22 Patient appears to be maintaining SR with only very brief palpitations. Continue Eliquis 5 mg BID with no missed doses for at least 3 months post ablation.  Continue diltiazem 120 mg daily Continue flecainide 100 mg BID for now, hopefully will be able to discontinue this.   2. Obesity Body mass index is 38.63 kg/m. Lifestyle modification was discussed at length including regular  exercise and weight reduction.  3. Obstructive sleep apnea The importance of adequate treatment of sleep apnea was discussed today in order to improve our ability to maintain sinus rhythm long term. Encouraged compliance with CPAP therapy.   4. HTN Stable, no changes today.   Follow up with Dr Quentin Ore as scheduled.    [facility] [address] [place], [place] [address] [phone] 10/02/2022 12:19 PM

## 2022-10-12 NOTE — Therapy (Signed)
OUTPATIENT PHYSICAL THERAPY NECK TREATMENT  Patient Name: Jose Garcia MRN: [id number] DOB:1961-04-02, 62 y.o., male Today's Date: 10/13/2022   PT End of Session - 10/13/22 0847     Visit Number 11    Number of Visits 33    Date for PT Re-Evaluation 11/15/22    Authorization Type eval: 07/19/22, recertification: 09/20/22;    PT Start Time 0845    PT Stop Time 0930    PT Time Calculation (min) 45 min    Activity Tolerance Patient tolerated treatment well    Behavior [facility] for tasks assessed/performed            Past Medical History:  Diagnosis Date   Atrial fibrillation    Cardiomyopathy    Resolved 05/03/09   Hyperlipidemia    Hypertension 03/03/96   Sleep apnea    Past Surgical History:  Procedure Laterality Date   ATRIAL FIBRILLATION ABLATION N/A 09/04/2022   Procedure: ATRIAL FIBRILLATION ABLATION;  Surgeon: Lanier PrudeLambert, Cameron T, MD;  Location: [facility];  Service: Cardiovascular;  Laterality: N/A;   CARDIOVERSION     Patient Active Problem List   Diagnosis Date Noted   OSA on CPAP 10/08/2019   Post-nasal drainage 10/08/2019   Morbid obesity 05/05/2015   ED (erectile dysfunction) 05/05/2015   Cardiomyopathy 11/06/2014   EDEMA 01/07/2010   Paroxysmal atrial fibrillation 01/22/2009   HYPERCHOLESTEROLEMIA 12/31/2007   Essential hypertension 12/31/2007   PCP: Smitty CordsKaramalegos, Alexander J, DO  REFERRING PROVIDER: Ronne BinningWood, Vicki P, NP  REFERRING DIAGNOSIS: Cervicalgia  THERAPY DIAG: Cervicalgia  RATIONALE FOR EVALUATION AND TREATMENT: Rehabilitation  ONSET DATE: Approximately 10-12 years ago  FOLLOW UP APPT WITH PROVIDER: Yes, plans to see MD for a physical this year  FROM INITIAL EVALUATION SUBJECTIVE:                                                                                                                                                                                         Chief Complaint: Neck pain and limited motion;  Pertinent  History Pt referred to physical therapy for limited neck mobility and pain. Mobility deficits have progressed for the last 10-12 years. He had seen chiropractor in the past but is not seeing one currently. No prior history of head or neck surgery. He has had both a cervical CT and MRI which showed multilevel degenerative changes of the cervical spine. This included OPLL with resulting severe spinal canal stenosis and cord compression at C3-C4 and C4-C5 without underlying cord signal abnormality. Foraminal narrowing, severe on the right at C3-C4 and C4-C5 and moderate at other levels as above. Large, bulky osteophytes along the  anterior aspect of the upper cervical spine extending from approximately C1-C5. There is mass effect on the posterior aspect of the oropharynx. Pt is having difficulty swallowing and reports that his esophagus feels "closed up." Pt has consulted with neurosurgery but per pt report none of the surgeons have encouraged him to proceed with surgery given the high risk of worsening his symptoms.  MRI CERVICAL SPINE WITHOUT CONTRAST (01/20/22) FINDINGS:  Alignment: Normal alignment  Bone marrow Signal: no suspicious lesions  Spinal cord: Normal signal. There is complete effacement of the anterior  and posterior CSF space at the C3-C4 (9:24) and C4-C5 (9:18) levels  resulting in cord compression. There is no underlying cord signal  abnormality.  Flowing anterior osteophytes consistent with DISH. OPLL.  Paraspinal Soft Tissues: Mild prevertebral edema.  Occiput-C1: Mild degenerative changes.  Atlanto-dental interval: Degenerative narrowing  C1-2 lateral masses: no significant degenerative change  C2-C3: Disc osteophyte complex. Moderate facet arthrosis, right greater  than left. Mild spinal canal stenosis. No foraminal narrowing.  C3-C4: Disc osteophyte complex. Severe spinal canal stenosis and cord  compression. Severe right and moderate left foraminal narrowing.  C4-C5: OPLL.  Severe spinal canal stenosis and cord compression. Severe  right and mild left foraminal narrowing.  C5-C6: OPLL. Moderate spinal canal stenosis. Mild bilateral foraminal  narrowing.  C6-C7: OPLL. Moderate spinal canal stenosis. No foraminal narrowing.  C7-T1: no significant stenosis   IMPRESSION:  1. Multilevel degenerative changes of the cervical spine. OPLL with  resulting severe spinal canal stenosis and cord compression at C3-C4 and  C4-C5. No underlying cord signal abnormality.  2.  Foraminal narrowing, severe on the right at C3-C4 and C4-C5 and  moderate at other levels as above.   Procedure: CT CERVICAL SPINE WITHOUT CONTRAST (01/20/22) CERVICAL SPINE FINDINGS:  Alignment: Normal.  Occipital Condyles: Intact.  Bones: No fractures or facet dislocations.  Intervertebral Discs: Normal.  Prevertebral soft tissues: No prevertebral soft tissue swelling.  Regional Soft Tissues: The regional soft tissues are unremarkable.  Lung Apices: The partially visualized lung apices are clear.  Skull Base: The partially imaged skull base is unremarkable.  Degenerative changes:  Large, bulky osteophytes along the anterior aspect of the upper cervical  spine extending from approximately C1-C5. There is mass effect on the  posterior aspect of the oropharynx. There is cervical spine DISH and OPLL  extending from C4-T1.  C1-C2: Extensive heterotopic osseous formation about the atlantodens  interval.  C2-C3: Uncovertebral degenerative changes. No high-grade spinal canal  stenosis.  C3-C4: Uncovertebral and facet degenerative changes. OPLL results in severe  canal stenosis.  C4-C5: Uncovertebral and facet degenerative changes as well as OPLL,  resulting in severe canal stenosis.  C5-C6: Facet and uncovertebral degenerative changes. OPLL.  C6-C7: Facet and uncovertebral degenerative change. OPLL. Mild canal  stenosis.  C7-T1: No high-grade spinal canal or neuroforaminal stenosis.   IMPRESSION:   Extensive degenerative changes of the cervical spine with multilevel DISH  and OPLL. There is severe spinal canal stenosis at C3-C4 and C4-C5 better  evaluated on same day MRI.   Pain:  Pain Intensity: Present: 0/10, Best: 0/10, Worst: 10/10 Pain location: R upper trap extending to the inside of the R shoulder blade, tightness on the left side of neck; Pain Quality: general tightness and muscle strain Radiating: Yes, Pain occasionally radiates from the R side of his neck to the top of his R shoulder; Numbness/Tingling: Yes, R side of neck and top of R shoulder, 2x/month numbness in R hand Focal Weakness:  No Aggravating factors: Getting into/out of cars due to laterally flexing and rotating his head (takes a day or two to recover), most activities increase his pain, no real change with TENS Relieving factors: rest after strain, Tylenol/ibuprofen, no significant difference with muscle relaxers, heat, ice helps when pt has significant muscle spasms, massage (self-massage and pt has had professional massage as well), minimal benefit from PT multiple years ago.  24-hour pain behavior: pain is worse at the end of the day History of prior neck injury, pain, surgery, or therapy: No, no surgery or notable trauma to neck Falls: Has patient fallen in last 6 months? 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  Recent memory is intact.  Remote memory is intact.  Attention span and concentration are intact.  Expressive speech is intact.  Patient's fund of knowledge is within normal limits for educational level.    Gross Musculoskeletal Assessment Tremor: None Bulk: Normal Tone: Normal  Gait Deferred  Posture Upper thoracic kyphosis noted with forward head and rounded shoulders. Unable to correct with cues. Limited spontaneous cervical motion noted  AROM AROM (Normal range in degrees) AROM 07/19/2022  Cervical  Flexion (50) 25  Extension (80) 8  Right lateral flexion (45) 10  Left lateral flexion (45) 6  Right rotation (85) 15  Left rotation (85) 16  (* = pain; Blank rows = not tested)  MMT MMT (out of 5) Right 07/19/2022 Left 07/19/2022  Cervical (isometric)  Flexion WNL  Extension WNL  Lateral Flexion WNL WNL  Rotation WNL WNL      Shoulder   Flexion 5 5  Extension    Abduction 5 5  Internal rotation    Horizontal abduction    Horizontal adduction    Lower Trapezius    Rhomboids        Elbow  Flexion 5 5  Extension 5 5  Pronation    Supination        Wrist  Flexion 5 5  Extension 5 5  Radial deviation    Ulnar deviation        MCP  Flexion 5 5  Extension 5 5  Abduction 5 5  Adduction 5 5  (* = pain; Blank rows = not tested)  Palpation Location LEFT  RIGHT           Suboccipitals 0 0  Cervical paraspinals 0 0  Upper Trapezius 0 0  Levator Scapulae 0 0  Rhomboid Major/Minor 0 0  (Blank rows = not tested) Graded on 0-4 scale (0 = no pain, 1 = pain, 2 = pain with wincing/grimacing/flinching, 3 = pain with withdrawal, 4 = unwilling to allow palpation), (Blank rows = not tested)  SPECIAL TESTS Spurlings A (ipsilateral lateral flexion/axial compression): R: Negative L: Negative Spurlings B (ipsilateral lateral  flexion/contralateral rotation/axial compression): R:  Negative L: Negative Distraction Test: Not examined  Hoffman Sign (cervical cord compression): R: Not examined L: Not examined ULTT Median: R: Not examined L: Not examined ULTT Ulnar: R: Not examined L: Not examined ULTT Radial: R: Not examined L: Not examined   TODAY'S TREATMENT    SUBJECTIVE: Pt reports that he is doing well today. He notes good maintenance of his cervical range of motion. No pain upon arrival. No specific questions or concerns.    PAIN: Denies   Manual Therapy Supine MET cervical lateral flexion and rotation stretches (5s contract/5s relax) gradually progressing into more ROM each direction bilaterally; Supine upper trap and levator scapulae stretch x 45s each bilaterally; Manually resisted lateral flexion and rotation x 5 each bilaterally; Supine L2-4 CPA, grade II-III, 20s/bout x 1 bout/level;   Trigger Point Dry Needling (TDN), unbilled Education performed with patient regarding potential benefit of TDN. Previously reviewed precautions and risks with patient. Pt provided verbal consent to treatment. In supine using clean technique TDN performed to bilateral upper traps with 4, 0.25 x 40 single needle placements (2 on each side) with local twitch response (LTR) during all placements. Pistoning technique utilized;   Mechanical Traction Before, and separate from, manual therapy, performed mechanical cervical traction with pt in supine, 20-30 degree pull angle with Saunders unit. Utilized 30# x 10 minutes with therapist constantly monitoring patient. Pt denies any pain during or after traction;   PATIENT EDUCATION:  Education details: Plan of care, manual therapy Person educated: Patient Education method: Explanation Education comprehension: verbalized understanding   HOME EXERCISE PROGRAM: Access Code: [email or web address] URL: [email or web address] Date: 07/26/2022 Prepared by: Ria Comment  Exercises - Seated Cervical Retraction  - 1-2 x daily - 7 x weekly - 2 sets - 10 reps - 3s hold - Seated Scapular Retraction  - 1-2 x daily - 7 x weekly - 2 sets - 10 reps - 3s hold - Seated Cervical Sidebending Stretch  - 4-6 x daily - 7 x weekly - 2-3 reps - 45-60s hold - Seated Assisted Cervical Rotation with Towel  - 4-6 x daily - 7 x weekly - 2-3 reps - 45-60s hold   ASSESSMENT:  CLINICAL IMPRESSION: Progressed cervical range of motion today with manual techniques and dry needling. Utilized mechanical traction again today as appears to have better ROM when utilizing mechanical traction at the beginning of sessions. No HEP updates on this date but pt encouraged to continue home stretching. Pt encouraged to follow-up as scheduled. Plan to update outcome measures regularly to track progress. He will benefit from skilled PT to address impairments in cervical range of motion and pain in order to improve function at home, work, and with leisure activities.  REHAB POTENTIAL: Fair    CLINICAL DECISION MAKING: Evolving/moderate complexity  EVALUATION COMPLEXITY: Moderate   GOALS: Goals reviewed with patient? Yes  SHORT TERM GOALS: Target date: 10/18/2022   Pt will be independent with HEP to improve strength and decrease neck pain to improve pain-free function at home and work. Baseline:  Goal status: PARTIALLY MET  LONG TERM GOALS: Target date: 11/15/2022   Pt will increase FOTO to at least 61 to demonstrate significant improvement in function at home and work related to neck pain  Baseline: 07/19/22: 49; 09/20/22: 67 Goal status: ACHIEVED  2.  Pt will decrease worst neck pain by at least 2 points on the NPRS in order to demonstrate clinically significant reduction in neck pain. Baseline: 07/19/22: 10/10; 09/20/22: 5/10; Goal status: ACHIEVED  3.  Pt will decrease NDI score by at least 19% in order demonstrate clinically significant reduction in neck pain/disability.        Baseline: 07/19/22: To be completed; 07/26/22: 10%; Goal status: DISCONTINUED  4.  Pt will increase cervical extension, bilateral rotation, and bilateral lateral flexion by at least 20 degrees each in order to improve his ability to work and drive with less pain Baseline: 07/19/22: extension: 8, lateral flexion (R/L): 10/6, rotation (R/L): 15/16; 09/20/22: extension: 19, lateral flexion (R/L): 28/27, rotation (R/L): 42/40; Goal status: REVISED   PLAN: PT FREQUENCY: 1-2x/week  PT DURATION: 8 weeks  PLANNED INTERVENTIONS: Therapeutic exercises, Therapeutic activity, Neuromuscular re-education, Balance training, Gait training, Patient/Family education, Joint manipulation, Joint mobilization, Vestibular training, Canalith repositioning, Dry Needling, Electrical stimulation, Spinal manipulation, Spinal mobilization, Cryotherapy, Moist heat, Taping, Traction, Ultrasound, Ionotophoresis 4mg /ml Dexamethasone, and Manual therapy  PLAN FOR NEXT SESSION:  progress manual techniques for ROM, TDN, review and modify HEP as necessary  Sharalyn Ink Phala Schraeder PT, DPT, GCS  Analise Glotfelty 10/13/2022, 1:55 PM

## 2022-10-13 ENCOUNTER — Ambulatory Visit: Payer: BC Managed Care – PPO | Attending: Family

## 2022-10-13 DIAGNOSIS — M542 Cervicalgia: Secondary | ICD-10-CM | POA: Diagnosis present

## 2022-10-24 ENCOUNTER — Ambulatory Visit: Payer: BC Managed Care – PPO

## 2022-10-24 DIAGNOSIS — M542 Cervicalgia: Secondary | ICD-10-CM

## 2022-10-24 NOTE — Therapy (Signed)
OUTPATIENT PHYSICAL THERAPY NECK TREATMENT  Patient Name:Jose Garcia MRN: [id number] DOB:04-07-1961, 62 y.o., male Today's Date: 10/24/2022   PT End of Session - 10/24/22 1022     Visit Number 12    Number of Visits 33    Date for PT Re-Evaluation 11/15/22    Authorization Type eval: 07/19/22, recertification: 09/20/22;    PT Start Time 1017    PT Stop Time 1100    PT Time Calculation (min) 43 min    Activity Tolerance Patient tolerated treatment well    Behavior During Therapy [facility] for tasks assessed/performed            Past Medical History:  Diagnosis Date   Atrial fibrillation    Cardiomyopathy    Resolved 05/03/09   Hyperlipidemia    Hypertension 03/03/96   Sleep apnea    Past Surgical History:  Procedure Laterality Date   ATRIAL FIBRILLATION ABLATION N/A 09/04/2022   Procedure: ATRIAL FIBRILLATION ABLATION;  Surgeon: Lanier Prude, MD;  Location: [facility];  Service: Cardiovascular;  Laterality: N/A;   CARDIOVERSION     Patient Active Problem List   Diagnosis Date Noted   OSA on CPAP 10/08/2019   Post-nasal drainage 10/08/2019   Morbid obesity 05/05/2015   ED (erectile dysfunction) 05/05/2015   Cardiomyopathy 11/06/2014   EDEMA 01/07/2010   Paroxysmal atrial fibrillation 01/22/2009   HYPERCHOLESTEROLEMIA 12/31/2007   Essential hypertension 12/31/2007   PCP: Smitty Cords, DO  REFERRING PROVIDER: Ronne Binning, NP  REFERRING DIAGNOSIS: Cervicalgia  THERAPY DIAG: Cervicalgia  RATIONALE FOR EVALUATION AND TREATMENT: Rehabilitation  ONSET DATE: Approximately 10-12 years ago  FOLLOW UP APPT WITH PROVIDER: Yes, plans to see MD for a physical this year  FROM INITIAL EVALUATION SUBJECTIVE:                                                                                                                                                                                         Chief Complaint: Neck pain and limited motion;  Pertinent  History Pt referred to physical therapy for limited neck mobility and pain. Mobility deficits have progressed for the last 10-12 years. He had seen chiropractor in the past but is not seeing one currently. No prior history of head or neck surgery. He has had both a cervical CT and MRI which showed multilevel degenerative changes of the cervical spine. This included OPLL with resulting severe spinal canal stenosis and cord compression at C3-C4 and C4-C5 without underlying cord signal abnormality. Foraminal narrowing, severe on the right at C3-C4 and C4-C5 and moderate at other levels as above. Large, bulky osteophytes along the  anterior aspect of the upper cervical spine extending from approximately C1-C5. There is mass effect on the posterior aspect of the oropharynx. Pt is having difficulty swallowing and reports that his esophagus feels "closed up." Pt has consulted with neurosurgery but per pt report none of the surgeons have encouraged him to proceed with surgery given the high risk of worsening his symptoms.  MRI CERVICAL SPINE WITHOUT CONTRAST (01/20/22) FINDINGS:  Alignment: Normal alignment  Bone marrow Signal: no suspicious lesions  Spinal cord: Normal signal. There is complete effacement of the anterior  and posterior CSF space at the C3-C4 (9:24) and C4-C5 (9:18) levels  resulting in cord compression. There is no underlying cord signal  abnormality.  Flowing anterior osteophytes consistent with DISH. OPLL.  Paraspinal Soft Tissues: Mild prevertebral edema.  Occiput-C1: Mild degenerative changes.  Atlanto-dental interval: Degenerative narrowing  C1-2 lateral masses: no significant degenerative change  C2-C3: Disc osteophyte complex. Moderate facet arthrosis, right greater  than left. Mild spinal canal stenosis. No foraminal narrowing.  C3-C4: Disc osteophyte complex. Severe spinal canal stenosis and cord  compression. Severe right and moderate left foraminal narrowing.  C4-C5: OPLL.  Severe spinal canal stenosis and cord compression. Severe  right and mild left foraminal narrowing.  C5-C6: OPLL. Moderate spinal canal stenosis. Mild bilateral foraminal  narrowing.  C6-C7: OPLL. Moderate spinal canal stenosis. No foraminal narrowing.  C7-T1: no significant stenosis   IMPRESSION:  1. Multilevel degenerative changes of the cervical spine. OPLL with  resulting severe spinal canal stenosis and cord compression at C3-C4 and  C4-C5. No underlying cord signal abnormality.  2.  Foraminal narrowing, severe on the right at C3-C4 and C4-C5 and  moderate at other levels as above.   Procedure: CT CERVICAL SPINE WITHOUT CONTRAST (01/20/22) CERVICAL SPINE FINDINGS:  Alignment: Normal.  Occipital Condyles: Intact.  Bones: No fractures or facet dislocations.  Intervertebral Discs: Normal.  Prevertebral soft tissues: No prevertebral soft tissue swelling.  Regional Soft Tissues: The regional soft tissues are unremarkable.  Lung Apices: The partially visualized lung apices are clear.  Skull Base: The partially imaged skull base is unremarkable.  Degenerative changes:  Large, bulky osteophytes along the anterior aspect of the upper cervical  spine extending from approximately C1-C5. There is mass effect on the  posterior aspect of the oropharynx. There is cervical spine DISH and OPLL  extending from C4-T1.  C1-C2: Extensive heterotopic osseous formation about the atlantodens  interval.  C2-C3: Uncovertebral degenerative changes. No high-grade spinal canal  stenosis.  C3-C4: Uncovertebral and facet degenerative changes. OPLL results in severe  canal stenosis.  C4-C5: Uncovertebral and facet degenerative changes as well as OPLL,  resulting in severe canal stenosis.  C5-C6: Facet and uncovertebral degenerative changes. OPLL.  C6-C7: Facet and uncovertebral degenerative change. OPLL. Mild canal  stenosis.  C7-T1: No high-grade spinal canal or neuroforaminal stenosis.   IMPRESSION:   Extensive degenerative changes of the cervical spine with multilevel DISH  and OPLL. There is severe spinal canal stenosis at C3-C4 and C4-C5 better  evaluated on same day MRI.   Pain:  Pain Intensity: Present: 0/10, Best: 0/10, Worst: 10/10 Pain location: R upper trap extending to the inside of the R shoulder blade, tightness on the left side of neck; Pain Quality: general tightness and muscle strain Radiating: Yes, Pain occasionally radiates from the R side of his neck to the top of his R shoulder; Numbness/Tingling: Yes, R side of neck and top of R shoulder, 2x/month numbness in R hand Focal Weakness:  No Aggravating factors: Getting into/out of cars due to laterally flexing and rotating his head (takes a day or two to recover), most activities increase his pain, no real change with TENS Relieving factors: rest after strain, Tylenol/ibuprofen, no significant difference with muscle relaxers, heat, ice helps when pt has significant muscle spasms, massage (self-massage and pt has had professional massage as well), minimal benefit from PT multiple years ago.  24-hour pain behavior: pain is worse at the end of the day History of prior neck injury, pain, surgery, or therapy: No, no surgery or notable trauma to neck Falls: Has patient fallen in last 6 months? 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  Recent memory is intact.  Remote memory is intact.  Attention span and concentration are intact.  Expressive speech is intact.  Patient's fund of knowledge is within normal limits for educational level.    Gross Musculoskeletal Assessment Tremor: None Bulk: Normal Tone: Normal  Gait Deferred  Posture Upper thoracic kyphosis noted with forward head and rounded shoulders. Unable to correct with cues. Limited spontaneous cervical motion noted  AROM AROM (Normal range in degrees) AROM 07/19/2022  Cervical  Flexion (50) 25  Extension (80) 8  Right lateral flexion (45) 10  Left lateral flexion (45) 6  Right rotation (85) 15  Left rotation (85) 16  (* = pain; Blank rows = not tested)  MMT MMT (out of 5) Right 07/19/2022 Left 07/19/2022  Cervical (isometric)  Flexion WNL  Extension WNL  Lateral Flexion WNL WNL  Rotation WNL WNL      Shoulder   Flexion 5 5  Extension    Abduction 5 5  Internal rotation    Horizontal abduction    Horizontal adduction    Lower Trapezius    Rhomboids        Elbow  Flexion 5 5  Extension 5 5  Pronation    Supination        Wrist  Flexion 5 5  Extension 5 5  Radial deviation    Ulnar deviation        MCP  Flexion 5 5  Extension 5 5  Abduction 5 5  Adduction 5 5  (* = pain; Blank rows = not tested)  Palpation Location LEFT  RIGHT           Suboccipitals 0 0  Cervical paraspinals 0 0  Upper Trapezius 0 0  Levator Scapulae 0 0  Rhomboid Major/Minor 0 0  (Blank rows = not tested) Graded on 0-4 scale (0 = no pain, 1 = pain, 2 = pain with wincing/grimacing/flinching, 3 = pain with withdrawal, 4 = unwilling to allow palpation), (Blank rows = not tested)  SPECIAL TESTS Spurlings A (ipsilateral lateral flexion/axial compression): R: Negative L: Negative Spurlings B (ipsilateral lateral  flexion/contralateral rotation/axial compression): R:  Negative L: Negative Distraction Test: Not examined  Hoffman Sign (cervical cord compression): R: Not examined L: Not examined ULTT Median: R: Not examined L: Not examined ULTT Ulnar: R: Not examined L: Not examined ULTT Radial: R: Not examined L: Not examined   TODAY'S TREATMENT    SUBJECTIVE: Pt reports that he is doing well today. He notes good maintenance of his cervical range of motion. No pain upon arrival. No specific questions or concerns.    PAIN: Denies   Manual Therapy Supine MET cervical lateral flexion and rotation stretches (5s contract/5s relax) gradually progressing into more ROM each direction bilaterally; Supine upper trap and levator scapulae stretch x 45s each bilaterally; Manually resisted lateral flexion and rotation x 5 each bilaterally; Supine L2-4 CPA, grade II-III, 20s/bout x 1 bout/level;   Trigger Point Dry Needling (TDN), unbilled Education performed with patient regarding potential benefit of TDN. Previously reviewed precautions and risks with patient. Pt provided verbal consent to treatment. In supine using clean technique TDN performed to bilateral upper traps with 4, 0.25 x 40 single needle placements (2 on each side) with local twitch response (LTR) during all placements. Pistoning technique utilized;   Mechanical Traction Before, and separate from, manual therapy, performed mechanical cervical traction with pt in supine, 20-30 degree pull angle with Saunders unit. Utilized 30# x 10 minutes with therapist constantly monitoring patient. Pt denies any pain during or after traction;   PATIENT EDUCATION:  Education details: Plan of care, manual therapy Person educated: Patient Education method: Explanation Education comprehension: verbalized understanding   HOME EXERCISE PROGRAM: Access Code: [email or web address] URL: [email or web address] Date: 07/26/2022 Prepared by: Ria Comment  Exercises - Seated Cervical Retraction  - 1-2 x daily - 7 x weekly - 2 sets - 10 reps - 3s hold - Seated Scapular Retraction  - 1-2 x daily - 7 x weekly - 2 sets - 10 reps - 3s hold - Seated Cervical Sidebending Stretch  - 4-6 x daily - 7 x weekly - 2-3 reps - 45-60s hold - Seated Assisted Cervical Rotation with Towel  - 4-6 x daily - 7 x weekly - 2-3 reps - 45-60s hold   ASSESSMENT:  CLINICAL IMPRESSION: Progressed cervical range of motion today with manual techniques and dry needling. Utilized mechanical traction again today as appears to have better ROM when utilizing mechanical traction during session. No HEP updates on this date but pt encouraged to continue home stretching. Pt encouraged to follow-up as scheduled. Plan to update outcome measures regularly to track progress. He will benefit from skilled PT to address impairments in cervical range of motion and pain in order to improve function at home, work, and with leisure activities.  REHAB POTENTIAL: Fair    CLINICAL DECISION MAKING: Evolving/moderate complexity  EVALUATION COMPLEXITY: Moderate   GOALS: Goals reviewed with patient? Yes  SHORT TERM GOALS: Target date: 10/18/2022   Pt will be independent with HEP to improve strength and decrease neck pain to improve pain-free function at home and work. Baseline:  Goal status: PARTIALLY MET  LONG TERM GOALS: Target date: 11/15/2022   Pt will increase FOTO to at least 61 to demonstrate significant improvement in function at home and work related to neck pain  Baseline: 07/19/22: 49; 09/20/22: 67 Goal status: ACHIEVED  2.  Pt will decrease worst neck pain by at least 2 points on the NPRS in order to demonstrate clinically significant reduction in neck pain. Baseline: 07/19/22: 10/10; 09/20/22: 5/10; Goal status: ACHIEVED  3.  Pt  will decrease NDI score by at least 19% in order demonstrate clinically significant reduction in neck pain/disability.       Baseline:  07/19/22: To be completed; 07/26/22: 10%; Goal status: DISCONTINUED  4.  Pt will increase cervical extension, bilateral rotation, and bilateral lateral flexion by at least 20 degrees each in order to improve his ability to work and drive with less pain Baseline: 07/19/22: extension: 8, lateral flexion (R/L): 10/6, rotation (R/L): 15/16; 09/20/22: extension: 19, lateral flexion (R/L): 28/27, rotation (R/L): 42/40; Goal status: REVISED   PLAN: PT FREQUENCY: 1-2x/week  PT DURATION: 8 weeks  PLANNED INTERVENTIONS: Therapeutic exercises, Therapeutic activity, Neuromuscular re-education, Balance training, Gait training, Patient/Family education, Joint manipulation, Joint mobilization, Vestibular training, Canalith repositioning, Dry Needling, Electrical stimulation, Spinal manipulation, Spinal mobilization, Cryotherapy, Moist heat, Taping, Traction, Ultrasound, Ionotophoresis 4mg /ml Dexamethasone, and Manual therapy  PLAN FOR NEXT SESSION:  progress manual techniques for ROM, TDN, review and modify HEP as necessary  Sharalyn Ink Vesta Wheeland PT, DPT, GCS  Lourine Alberico 10/24/2022, 9:32 PM

## 2022-10-31 ENCOUNTER — Telehealth: Payer: Self-pay | Admitting: Internal Medicine

## 2022-10-31 MED ORDER — FUROSEMIDE 20 MG PO TABS: 20.0000 mg | ORAL_TABLET | Freq: Every day | ORAL | 1 refills | Status: DC | PRN

## 2022-10-31 NOTE — Telephone Encounter (Signed)
Requested Prescriptions   Signed Prescriptions Disp Refills   furosemide (LASIX) 20 MG tablet 30 tablet 1    Sig: Take 1 tablet (20 mg total) by mouth daily as needed.    Authorizing Provider: Duke Salvia    Ordering User: Thayer Headings, Velma Hanna L   PRN medication

## 2022-10-31 NOTE — Telephone Encounter (Signed)
*  STAT* If patient is at the pharmacy, call can be transferred to refill team.   1. Which medications need to be refilled? (please list name of each medication and dose if known) furosemide (LASIX) 20 MG tablet  2. Which pharmacy/location (including street and city if local pharmacy) is medication to be sent to? [id number] - [place], [place] - [address]  3. Do they need a 30 day or 90 day supply? 90  

## 2022-11-08 ENCOUNTER — Ambulatory Visit: Payer: BC Managed Care – PPO | Attending: Family

## 2022-11-08 DIAGNOSIS — M542 Cervicalgia: Secondary | ICD-10-CM | POA: Diagnosis present

## 2022-11-08 NOTE — Therapy (Signed)
OUTPATIENT PHYSICAL THERAPY NECK TREATMENT  Patient Name: Jose Garcia MRN: [id number] DOB:06-25-1961, 62 y.o., male Today's Date: 11/08/2022   PT End of Session - 11/08/22 1256     Visit Number 13    Number of Visits 33    Date for PT Re-Evaluation 11/15/22    Authorization Type eval: 07/19/22, recertification: 09/20/22;    PT Start Time 1150    PT Stop Time 1230    PT Time Calculation (min) 40 min    Activity Tolerance Patient tolerated treatment well    Behavior During Therapy [facility] for tasks assessed/performed            Past Medical History:  Diagnosis Date   Atrial fibrillation (HCC)    Cardiomyopathy    Resolved 05/03/09   Hyperlipidemia    Hypertension 03/03/96   Sleep apnea    Past Surgical History:  Procedure Laterality Date   ATRIAL FIBRILLATION ABLATION N/A 09/04/2022   Procedure: ATRIAL FIBRILLATION ABLATION;  Surgeon: Lanier Prude, MD;  Location: [facility];  Service: Cardiovascular;  Laterality: N/A;   CARDIOVERSION     Patient Active Problem List   Diagnosis Date Noted   OSA on CPAP 10/08/2019   Post-nasal drainage 10/08/2019   Morbid obesity (HCC) 05/05/2015   ED (erectile dysfunction) 05/05/2015   Cardiomyopathy (HCC) 11/06/2014   EDEMA 01/07/2010   Paroxysmal atrial fibrillation (HCC) 01/22/2009   HYPERCHOLESTEROLEMIA 12/31/2007   Essential hypertension 12/31/2007   PCP: Smitty Cords, DO  REFERRING PROVIDER: Ronne Binning, NP  REFERRING DIAGNOSIS: Cervicalgia  THERAPY DIAG: Cervicalgia  RATIONALE FOR EVALUATION AND TREATMENT: Rehabilitation  ONSET DATE: Approximately 10-12 years ago  FOLLOW UP APPT WITH PROVIDER: Yes, plans to see MD for a physical this year  FROM INITIAL EVALUATION SUBJECTIVE:                                                                                                                                                                                         Chief Complaint: Neck pain and limited  motion;  Pertinent History Pt referred to physical therapy for limited neck mobility and pain. Mobility deficits have progressed for the last 10-12 years. He had seen chiropractor in the past but is not seeing one currently. No prior history of head or neck surgery. He has had both a cervical CT and MRI which showed multilevel degenerative changes of the cervical spine. This included OPLL with resulting severe spinal canal stenosis and cord compression at C3-C4 and C4-C5 without underlying cord signal abnormality. Foraminal narrowing, severe on the right at C3-C4 and C4-C5 and moderate at other levels as above. Large,  bulky osteophytes along the anterior aspect of the upper cervical spine extending from approximately C1-C5. There is mass effect on the posterior aspect of the oropharynx. Pt is having difficulty swallowing and reports that his esophagus feels "closed up." Pt has consulted with neurosurgery but per pt report none of the surgeons have encouraged him to proceed with surgery given the high risk of worsening his symptoms.  MRI CERVICAL SPINE WITHOUT CONTRAST (01/20/22) FINDINGS:  Alignment: Normal alignment  Bone marrow Signal: no suspicious lesions  Spinal cord: Normal signal. There is complete effacement of the anterior  and posterior CSF space at the C3-C4 (9:24) and C4-C5 (9:18) levels  resulting in cord compression. There is no underlying cord signal  abnormality.  Flowing anterior osteophytes consistent with DISH. OPLL.  Paraspinal Soft Tissues: Mild prevertebral edema.  Occiput-C1: Mild degenerative changes.  Atlanto-dental interval: Degenerative narrowing  C1-2 lateral masses: no significant degenerative change  C2-C3: Disc osteophyte complex. Moderate facet arthrosis, right greater  than left. Mild spinal canal stenosis. No foraminal narrowing.  C3-C4: Disc osteophyte complex. Severe spinal canal stenosis and cord  compression. Severe right and moderate left foraminal  narrowing.  C4-C5: OPLL. Severe spinal canal stenosis and cord compression. Severe  right and mild left foraminal narrowing.  C5-C6: OPLL. Moderate spinal canal stenosis. Mild bilateral foraminal  narrowing.  C6-C7: OPLL. Moderate spinal canal stenosis. No foraminal narrowing.  C7-T1: no significant stenosis   IMPRESSION:  1. Multilevel degenerative changes of the cervical spine. OPLL with  resulting severe spinal canal stenosis and cord compression at C3-C4 and  C4-C5. No underlying cord signal abnormality.  2.  Foraminal narrowing, severe on the right at C3-C4 and C4-C5 and  moderate at other levels as above.   Procedure: CT CERVICAL SPINE WITHOUT CONTRAST (01/20/22) CERVICAL SPINE FINDINGS:  Alignment: Normal.  Occipital Condyles: Intact.  Bones: No fractures or facet dislocations.  Intervertebral Discs: Normal.  Prevertebral soft tissues: No prevertebral soft tissue swelling.  Regional Soft Tissues: The regional soft tissues are unremarkable.  Lung Apices: The partially visualized lung apices are clear.  Skull Base: The partially imaged skull base is unremarkable.  Degenerative changes:  Large, bulky osteophytes along the anterior aspect of the upper cervical  spine extending from approximately C1-C5. There is mass effect on the  posterior aspect of the oropharynx. There is cervical spine DISH and OPLL  extending from C4-T1.  C1-C2: Extensive heterotopic osseous formation about the atlantodens  interval.  C2-C3: Uncovertebral degenerative changes. No high-grade spinal canal  stenosis.  C3-C4: Uncovertebral and facet degenerative changes. OPLL results in severe  canal stenosis.  C4-C5: Uncovertebral and facet degenerative changes as well as OPLL,  resulting in severe canal stenosis.  C5-C6: Facet and uncovertebral degenerative changes. OPLL.  C6-C7: Facet and uncovertebral degenerative change. OPLL. Mild canal  stenosis.  C7-T1: No high-grade spinal canal or neuroforaminal  stenosis.   IMPRESSION:  Extensive degenerative changes of the cervical spine with multilevel DISH  and OPLL. There is severe spinal canal stenosis at C3-C4 and C4-C5 better  evaluated on same day MRI.   Pain:  Pain Intensity: Present: 0/10, Best: 0/10, Worst: 10/10 Pain location: R upper trap extending to the inside of the R shoulder blade, tightness on the left side of neck; Pain Quality: general tightness and muscle strain Radiating: Yes, Pain occasionally radiates from the R side of his neck to the top of his R shoulder; Numbness/Tingling: Yes, R side of neck and top of R shoulder, 2x/month numbness in  R hand Focal Weakness: No Aggravating factors: Getting into/out of cars due to laterally flexing and rotating his head (takes a day or two to recover), most activities increase his pain, no real change with TENS Relieving factors: rest after strain, Tylenol/ibuprofen, no significant difference with muscle relaxers, heat, ice helps when pt has significant muscle spasms, massage (self-massage and pt has had professional massage as well), minimal benefit from PT multiple years ago.  24-hour pain behavior: pain is worse at the end of the day History of prior neck injury, pain, surgery, or therapy: No, no surgery or notable trauma to neck Falls: Has patient fallen in last 6 months? 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  Recent memory is intact.  Remote memory is intact.  Attention span and concentration are intact.  Expressive speech is intact.  Patient's fund of knowledge is within normal limits for educational level.    Gross Musculoskeletal Assessment Tremor: None Bulk: Normal Tone: Normal  Gait Deferred  Posture Upper thoracic kyphosis noted with forward head and rounded shoulders. Unable to correct with cues. Limited spontaneous cervical motion noted  AROM AROM (Normal range in degrees) AROM 07/19/2022  Cervical  Flexion (50) 25  Extension (80) 8  Right lateral flexion (45) 10  Left lateral flexion (45) 6  Right rotation (85) 15  Left rotation (85) 16  (* = pain; Blank rows = not tested)  MMT MMT (out of 5) Right 07/19/2022 Left 07/19/2022  Cervical (isometric)  Flexion WNL  Extension WNL  Lateral Flexion WNL WNL  Rotation WNL WNL      Shoulder   Flexion 5 5  Extension    Abduction 5 5  Internal rotation    Horizontal abduction    Horizontal adduction    Lower Trapezius    Rhomboids        Elbow  Flexion 5 5  Extension 5 5  Pronation    Supination        Wrist  Flexion 5 5  Extension 5 5  Radial deviation    Ulnar deviation        MCP  Flexion 5 5  Extension 5 5  Abduction 5 5  Adduction 5 5  (* = pain; Blank rows = not tested)  Palpation Location LEFT  RIGHT           Suboccipitals 0 0  Cervical paraspinals 0 0  Upper Trapezius 0 0  Levator Scapulae 0 0  Rhomboid Major/Minor 0 0  (Blank rows = not tested) Graded on 0-4 scale (0 = no pain, 1 = pain, 2 = pain with wincing/grimacing/flinching, 3 = pain with withdrawal, 4 = unwilling to allow palpation), (Blank rows = not tested)  SPECIAL TESTS Spurlings A (ipsilateral lateral flexion/axial compression): R: Negative L: Negative Spurlings B  (ipsilateral lateral  flexion/contralateral rotation/axial compression): R: Negative L: Negative Distraction Test: Not examined  Hoffman Sign (cervical cord compression): R: Not examined L: Not examined ULTT Median: R: Not examined L: Not examined ULTT Ulnar: R: Not examined L: Not examined ULTT Radial: R: Not examined L: Not examined   TODAY'S TREATMENT    SUBJECTIVE: Pt reports that he is doing well today. He notes good maintenance of his cervical range of motion. No pain upon arrival but he is a little sore today after working on his car this weekend. No specific questions or concerns.    PAIN: Denies pain but reports some soreness   Manual Therapy Supine MET cervical lateral flexion and rotation stretches (5s contract/5s relax) gradually progressing into more ROM each direction bilaterally; Supine upper trap and levator scapulae stretch x 45s each bilaterally; Manually resisted lateral flexion and rotation x 5 each bilaterally; Supine L2-4 CPA, grade II-III, 20s/bout x 1 bout/level;   Trigger Point Dry Needling (TDN), unbilled Education performed with patient regarding potential benefit of TDN. Previously reviewed precautions and risks with patient. Pt provided verbal consent to treatment. In supine using clean technique TDN performed to bilateral upper traps with 4, 0.25 x 40 single needle placements (2 on each side) with local twitch response (LTR) during all placements. Pistoning technique utilized;   Mechanical Traction Before, and separate from, manual therapy, performed mechanical cervical traction with pt in supine, 20-30 degree pull angle with Saunders unit. Utilized 30# x 10 minutes with therapist constantly monitoring patient. Pt denies any pain during or after traction;   PATIENT EDUCATION:  Education details: Plan of care, manual therapy Person educated: Patient Education method: Explanation Education comprehension: verbalized understanding   HOME EXERCISE  PROGRAM: Access Code: [email or web address] URL: [email or web address] Date: 07/26/2022 Prepared by: Ria Comment  Exercises - Seated Cervical Retraction  - 1-2 x daily - 7 x weekly - 2 sets - 10 reps - 3s hold - Seated Scapular Retraction  - 1-2 x daily - 7 x weekly - 2 sets - 10 reps - 3s hold - Seated Cervical Sidebending Stretch  - 4-6 x daily - 7 x weekly - 2-3 reps - 45-60s hold - Seated Assisted Cervical Rotation with Towel  - 4-6 x daily - 7 x weekly - 2-3 reps - 45-60s hold   ASSESSMENT:  CLINICAL IMPRESSION: Progressed cervical range of motion today with manual techniques and dry needling. Utilized mechanical traction again today as appears to have better ROM when utilizing mechanical traction during session. No HEP updates on this date but pt encouraged to continue home stretching. Pt encouraged to follow-up as scheduled. Plan to update outcome measures regularly to track progress. He will benefit from skilled PT to address impairments in cervical range of motion and pain in order to improve function at home, work, and with leisure activities.  REHAB POTENTIAL: Fair    CLINICAL DECISION MAKING: Evolving/moderate complexity  EVALUATION COMPLEXITY: Moderate   GOALS: Goals reviewed with patient? Yes  SHORT TERM GOALS: Target date: 10/18/2022   Pt will be independent with HEP to improve strength and decrease neck pain to improve pain-free function at home and work. Baseline:  Goal status: PARTIALLY MET  LONG TERM GOALS: Target date: 11/15/2022   Pt will increase FOTO to at least 61 to demonstrate significant improvement in function at home and work related to neck pain  Baseline: 07/19/22: 49; 09/20/22: 67 Goal status: ACHIEVED  2.  Pt will decrease worst neck pain by at least 2 points on the  NPRS in order to demonstrate clinically significant reduction in neck pain. Baseline: 07/19/22: 10/10; 09/20/22: 5/10; Goal status: ACHIEVED  3.  Pt will decrease NDI score by at  least 19% in order demonstrate clinically significant reduction in neck pain/disability.       Baseline: 07/19/22: To be completed; 07/26/22: 10%; Goal status: DISCONTINUED  4.  Pt will increase cervical extension, bilateral rotation, and bilateral lateral flexion by at least 20 degrees each in order to improve his ability to work and drive with less pain Baseline: 07/19/22: extension: 8, lateral flexion (R/L): 10/6, rotation (R/L): 15/16; 09/20/22: extension: 19, lateral flexion (R/L): 28/27, rotation (R/L): 42/40; Goal status: REVISED   PLAN: PT FREQUENCY: 1-2x/week  PT DURATION: 8 weeks  PLANNED INTERVENTIONS: Therapeutic exercises, Therapeutic activity, Neuromuscular re-education, Balance training, Gait training, Patient/Family education, Joint manipulation, Joint mobilization, Vestibular training, Canalith repositioning, Dry Needling, Electrical stimulation, Spinal manipulation, Spinal mobilization, Cryotherapy, Moist heat, Taping, Traction, Ultrasound, Ionotophoresis 4mg /ml Dexamethasone, and Manual therapy  PLAN FOR NEXT SESSION:  progress manual techniques for ROM, TDN, review and modify HEP as necessary  Sharalyn Ink Blondell Laperle PT, DPT, GCS  Leenah Seidner 11/08/2022, 12:59 PM

## 2022-11-22 ENCOUNTER — Ambulatory Visit: Payer: BC Managed Care – PPO

## 2022-11-22 DIAGNOSIS — M542 Cervicalgia: Secondary | ICD-10-CM | POA: Diagnosis not present

## 2022-11-22 NOTE — Therapy (Signed)
OUTPATIENT PHYSICAL THERAPY NECK TREATMENT/RECERTIFICATION?/DISCHARGE?  Patient Name: Jose Garcia MRN: [id number] DOB:1961/01/09, 62 y.o., male Today's Date: 11/22/2022   PT End of Session - 11/22/22 1144     Visit Number 14    Number of Visits 33    Date for PT Re-Evaluation 11/15/22    Authorization Type eval: 07/19/22, recertification: 09/20/22;    PT Start Time 1145    PT Stop Time 1230    PT Time Calculation (min) 45 min    Activity Tolerance Patient tolerated treatment well    Behavior During Therapy [facility] for tasks assessed/performed             Past Medical History:  Diagnosis Date   Atrial fibrillation (HCC)    Cardiomyopathy    Resolved 05/03/09   Hyperlipidemia    Hypertension 03/03/96   Sleep apnea    Past Surgical History:  Procedure Laterality Date   ATRIAL FIBRILLATION ABLATION N/A 09/04/2022   Procedure: ATRIAL FIBRILLATION ABLATION;  Surgeon: Lanier Prude, MD;  Location: [facility];  Service: Cardiovascular;  Laterality: N/A;   CARDIOVERSION     Patient Active Problem List   Diagnosis Date Noted   OSA on CPAP 10/08/2019   Post-nasal drainage 10/08/2019   Morbid obesity (HCC) 05/05/2015   ED (erectile dysfunction) 05/05/2015   Cardiomyopathy (HCC) 11/06/2014   EDEMA 01/07/2010   Paroxysmal atrial fibrillation (HCC) 01/22/2009   HYPERCHOLESTEROLEMIA 12/31/2007   Essential hypertension 12/31/2007   PCP: Smitty Cords, DO  REFERRING PROVIDER: Saralyn Pilar *  REFERRING DIAGNOSIS: Cervicalgia  THERAPY DIAG: Cervicalgia  RATIONALE FOR EVALUATION AND TREATMENT: Rehabilitation  ONSET DATE: Approximately 10-12 years ago  FOLLOW UP APPT WITH PROVIDER: Yes, plans to see MD for a physical this year  FROM INITIAL EVALUATION SUBJECTIVE:                                                                                                                                                                                          Chief Complaint: Neck pain and limited motion;  Pertinent History Pt referred to physical therapy for limited neck mobility and pain. Mobility deficits have progressed for the last 10-12 years. He had seen chiropractor in the past but is not seeing one currently. No prior history of head or neck surgery. He has had both a cervical CT and MRI which showed multilevel degenerative changes of the cervical spine. This included OPLL with resulting severe spinal canal stenosis and cord compression at C3-C4 and C4-C5 without underlying cord signal abnormality. Foraminal narrowing, severe on the right at C3-C4 and C4-C5 and moderate at other levels as above. Large,  bulky osteophytes along the anterior aspect of the upper cervical spine extending from approximately C1-C5. There is mass effect on the posterior aspect of the oropharynx. Pt is having difficulty swallowing and reports that his esophagus feels "closed up." Pt has consulted with neurosurgery but per pt report none of the surgeons have encouraged him to proceed with surgery given the high risk of worsening his symptoms.  MRI CERVICAL SPINE WITHOUT CONTRAST (01/20/22) FINDINGS:  Alignment: Normal alignment  Bone marrow Signal: no suspicious lesions  Spinal cord: Normal signal. There is complete effacement of the anterior  and posterior CSF space at the C3-C4 (9:24) and C4-C5 (9:18) levels  resulting in cord compression. There is no underlying cord signal  abnormality.  Flowing anterior osteophytes consistent with DISH. OPLL.  Paraspinal Soft Tissues: Mild prevertebral edema.  Occiput-C1: Mild degenerative changes.  Atlanto-dental interval: Degenerative narrowing  C1-2 lateral masses: no significant degenerative change  C2-C3: Disc osteophyte complex. Moderate facet arthrosis, right greater  than left. Mild spinal canal stenosis. No foraminal narrowing.  C3-C4: Disc osteophyte complex. Severe spinal canal stenosis and cord  compression. Severe  right and moderate left foraminal narrowing.  C4-C5: OPLL. Severe spinal canal stenosis and cord compression. Severe  right and mild left foraminal narrowing.  C5-C6: OPLL. Moderate spinal canal stenosis. Mild bilateral foraminal  narrowing.  C6-C7: OPLL. Moderate spinal canal stenosis. No foraminal narrowing.  C7-T1: no significant stenosis   IMPRESSION:  1. Multilevel degenerative changes of the cervical spine. OPLL with  resulting severe spinal canal stenosis and cord compression at C3-C4 and  C4-C5. No underlying cord signal abnormality.  2.  Foraminal narrowing, severe on the right at C3-C4 and C4-C5 and  moderate at other levels as above.   Procedure: CT CERVICAL SPINE WITHOUT CONTRAST (01/20/22) CERVICAL SPINE FINDINGS:  Alignment: Normal.  Occipital Condyles: Intact.  Bones: No fractures or facet dislocations.  Intervertebral Discs: Normal.  Prevertebral soft tissues: No prevertebral soft tissue swelling.  Regional Soft Tissues: The regional soft tissues are unremarkable.  Lung Apices: The partially visualized lung apices are clear.  Skull Base: The partially imaged skull base is unremarkable.  Degenerative changes:  Large, bulky osteophytes along the anterior aspect of the upper cervical  spine extending from approximately C1-C5. There is mass effect on the  posterior aspect of the oropharynx. There is cervical spine DISH and OPLL  extending from C4-T1.  C1-C2: Extensive heterotopic osseous formation about the atlantodens  interval.  C2-C3: Uncovertebral degenerative changes. No high-grade spinal canal  stenosis.  C3-C4: Uncovertebral and facet degenerative changes. OPLL results in severe  canal stenosis.  C4-C5: Uncovertebral and facet degenerative changes as well as OPLL,  resulting in severe canal stenosis.  C5-C6: Facet and uncovertebral degenerative changes. OPLL.  C6-C7: Facet and uncovertebral degenerative change. OPLL. Mild canal  stenosis.  C7-T1: No  high-grade spinal canal or neuroforaminal stenosis.   IMPRESSION:  Extensive degenerative changes of the cervical spine with multilevel DISH  and OPLL. There is severe spinal canal stenosis at C3-C4 and C4-C5 better  evaluated on same day MRI.   Pain:  Pain Intensity: Present: 0/10, Best: 0/10, Worst: 10/10 Pain location: R upper trap extending to the inside of the R shoulder blade, tightness on the left side of neck; Pain Quality: general tightness and muscle strain Radiating: Yes, Pain occasionally radiates from the R side of his neck to the top of his R shoulder; Numbness/Tingling: Yes, R side of neck and top of R shoulder, 2x/month numbness in  R hand Focal Weakness: No Aggravating factors: Getting into/out of cars due to laterally flexing and rotating his head (takes a day or two to recover), most activities increase his pain, no real change with TENS Relieving factors: rest after strain, Tylenol/ibuprofen, no significant difference with muscle relaxers, heat, ice helps when pt has significant muscle spasms, massage (self-massage and pt has had professional massage as well), minimal benefit from PT multiple years ago.  24-hour pain behavior: pain is worse at the end of the day History of prior neck injury, pain, surgery, or therapy: No, no surgery or notable trauma to neck Falls: Has patient fallen in last 6 months? 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  Recent memory is intact.  Remote memory is intact.  Attention span and concentration are intact.  Expressive speech is intact.  Patient's fund of knowledge is within normal limits for educational level.    Gross Musculoskeletal Assessment Tremor: None Bulk: Normal Tone: Normal  Gait Deferred  Posture Upper thoracic kyphosis noted with forward head and rounded shoulders. Unable to correct with cues. Limited spontaneous cervical motion noted  AROM AROM (Normal range in degrees) AROM 07/19/2022  Cervical  Flexion (50) 25  Extension (80) 8  Right lateral flexion (45) 10  Left lateral flexion (45) 6  Right rotation (85) 15  Left rotation (85) 16  (* = pain; Blank rows = not tested)  MMT MMT (out of 5) Right 07/19/2022 Left 07/19/2022  Cervical (isometric)  Flexion WNL  Extension WNL  Lateral Flexion WNL WNL  Rotation WNL WNL      Shoulder   Flexion 5 5  Extension    Abduction 5 5  Internal rotation    Horizontal abduction    Horizontal adduction    Lower Trapezius    Rhomboids        Elbow  Flexion 5 5  Extension 5 5  Pronation    Supination        Wrist  Flexion 5 5  Extension 5 5  Radial deviation    Ulnar deviation        MCP  Flexion 5 5  Extension 5 5  Abduction 5 5  Adduction 5 5  (* = pain; Blank rows = not tested)  Palpation Location LEFT  RIGHT           Suboccipitals 0 0  Cervical paraspinals 0 0  Upper Trapezius 0 0  Levator Scapulae 0 0  Rhomboid Major/Minor 0 0  (Blank rows = not tested) Graded on 0-4 scale (0 = no pain, 1 = pain, 2 = pain with wincing/grimacing/flinching, 3 = pain with withdrawal, 4 = unwilling to allow palpation), (Blank rows = not tested)  SPECIAL TESTS Spurlings A (ipsilateral lateral flexion/axial  compression): R: Negative L: Negative Spurlings B (ipsilateral lateral  flexion/contralateral rotation/axial compression): R: Negative L: Negative Distraction Test: Not examined  Hoffman Sign (cervical cord compression): R: Not examined L: Not examined ULTT Median: R: Not examined L: Not examined ULTT Ulnar: R: Not examined L: Not examined ULTT Radial: R: Not examined L: Not examined   TODAY'S TREATMENT    SUBJECTIVE: Pt reports that he is doing well today. No more episodes of spasm. He notes good maintenance of his cervical range of motion. No pain upon arrival. No specific questions or concerns.    PAIN: Denies pain but reports some soreness   Manual Therapy Updated outcome measures: Cervical range of motion: extension: 19, lateral flexion (R/L): 23/15, rotation (R/L): 50/34; Worst pain: 4/10; FOTO: 54  Supine MET cervical lateral flexion and rotation stretches (5s contract/5s relax) gradually progressing into more ROM each direction bilaterally; Supine upper trap and levator scapulae stretch x 45s each bilaterally; Manually resisted lateral flexion and rotation x 5 each bilaterally; Supine L2-4 CPA, grade II-III, 20s/bout x 1 bout/level;   Trigger Point Dry Needling (TDN), unbilled Education performed with patient regarding potential benefit of TDN. Previously reviewed precautions and risks with patient. Pt provided verbal consent to treatment. In supine using clean technique TDN performed to bilateral upper traps with 4, 0.25 x 40 single needle placements (2 on each side) with local twitch response (LTR) during all placements. Pistoning technique utilized;   Mechanical Traction Before, and separate from, manual therapy, performed mechanical cervical traction with pt in supine, 20-30 degree pull angle with Saunders unit. Utilized 30# x 10 minutes with therapist constantly monitoring patient. Pt denies any pain during or after traction;   PATIENT EDUCATION:  Education details:  Plan of care, manual therapy Person educated: Patient Education method: Explanation Education comprehension: verbalized understanding   HOME EXERCISE PROGRAM: Access Code: [email or web address] URL: [email or web address] Date: 07/26/2022 Prepared by: Ria Comment  Exercises - Seated Cervical Retraction  - 1-2 x daily - 7 x weekly - 2 sets - 10 reps - 3s hold - Seated Scapular Retraction  - 1-2 x daily - 7 x weekly - 2 sets - 10 reps - 3s hold - Seated Cervical Sidebending Stretch  - 4-6 x daily - 7 x weekly - 2-3 reps - 45-60s hold - Seated Assisted Cervical Rotation with Towel  - 4-6 x daily - 7 x weekly - 2-3 reps - 45-60s hold   ASSESSMENT:  CLINICAL IMPRESSION: Progressed cervical range of motion today with manual techniques and dry needling. Utilized mechanical traction again today as appears to have better ROM when utilizing mechanical traction at the beginning of sessions. Updated outcome measures and his cervical range and pain have improved considerably since starting therapy. His FOTO score improved from 49 at the initial evaluation to 67 today. No HEP updates on this date. Pt encouraged to follow-up as scheduled. He will benefit from skilled PT to address impairments in cervical range of motion and pain in order to improve function at home, work, and with leisure activities.   Progressed cervical range of motion today with manual techniques and dry needling. Utilized mechanical traction again today as appears to have better ROM when utilizing mechanical traction during session. No HEP updates on this date but pt encouraged to continue home stretching. Pt encouraged to follow-up as scheduled. Plan to update outcome measures regularly to track progress. He will benefit from skilled PT to address impairments in cervical range of motion and pain in order to improve function at home, work, and with leisure activities.  REHAB POTENTIAL: Fair    CLINICAL DECISION MAKING:  Evolving/moderate complexity  EVALUATION COMPLEXITY: Moderate   GOALS: Goals reviewed with patient? Yes  SHORT TERM GOALS: Target date: 10/18/2022   Pt will be independent with HEP to improve strength and decrease neck pain to improve pain-free function at home and work. Baseline:  Goal status: PARTIALLY MET  LONG TERM GOALS: Target date: 11/15/2022   Pt will increase FOTO to at least 61 to demonstrate significant improvement in function at home and work related to neck pain  Baseline: 07/19/22: 49; 09/20/22: 67 Goal status: ACHIEVED  2.  Pt will decrease worst neck pain by at least 2 points on the NPRS in order to demonstrate clinically significant reduction in neck pain. Baseline: 07/19/22: 10/10; 09/20/22: 5/10; 11/22/22: 4/10; Goal status: ACHIEVED  3.  Pt will decrease NDI score by at least 19% in order demonstrate clinically significant reduction in neck pain/disability.       Baseline: 07/19/22: To be completed; 07/26/22: 10%; Goal status: DISCONTINUED  4.  Pt will increase cervical extension, bilateral rotation, and bilateral lateral flexion by at least 20 degrees each in order to improve his ability to work and drive with less pain Baseline: 07/19/22: extension: 8, lateral flexion (R/L): 10/6, rotation (R/L): 15/16; 09/20/22: extension: 19, lateral flexion (R/L): 28/27, rotation (R/L): 42/40; 11/22/22:  Goal status: REVISED   PLAN: PT FREQUENCY: 1-2x/week  PT DURATION: 8 weeks  PLANNED INTERVENTIONS: Therapeutic exercises, Therapeutic activity, Neuromuscular re-education, Balance training, Gait training, Patient/Family education, Joint manipulation, Joint mobilization, Vestibular training, Canalith repositioning, Dry Needling, Electrical stimulation, Spinal manipulation, Spinal mobilization, Cryotherapy, Moist heat, Taping, Traction, Ultrasound, Ionotophoresis 4mg /ml Dexamethasone, and Manual therapy  PLAN FOR NEXT SESSION:  progress manual techniques for ROM, TDN, review and  modify HEP as necessary  Sharalyn Ink Jonathin Heinicke PT, DPT, GCS  Jose Garcia 11/22/2022, 11:45 AM

## 2022-12-05 NOTE — Progress Notes (Unsigned)
  Electrophysiology Office Follow up Visit Note:    Date:  12/06/2022   ID:  Jose Garcia, DOB 1960/07/13, MRN [id number]  PCP:  Smitty Cords, DO  [facility] Cardiologist:  None  [facility] Electrophysiologist:  Lanier Prude, MD    Interval History:    Jose Garcia is a 62 y.o. male who presents for a follow up visit.   He had an A-fib ablation on September 04, 2022.  During the procedure, the pulmonary veins and posterior wall were isolated.  The patient is all Jose Garcia on October 02, 2022.  At that appointment he reported no sustained episodes of atrial fibrillation following the ablation.  He was on Eliquis and flecainide at that appointment in addition to diltiazem. He has done well after the ablation without recurrence of atrial fibrillation.  Groin sites healed well.      Past medical, surgical, social and family history were reviewed.  ROS:   Please see the history of present illness.    All other systems reviewed and are negative.  EKGs/Labs/Other Studies Reviewed:    The following studies were reviewed today:  EKG:  The ekg ordered today demonstrates sinus rhythm.  PR 192 ms.  QRS 106 ms.   Physical Exam:    VS:  BP 124/62   Pulse 67   Ht 6' (1.829 m)   Wt 281 lb (127.5 kg)   SpO2 95%   BMI 38.11 kg/m     Wt Readings from Last 3 Encounters:  12/06/22 281 lb (127.5 kg)  10/02/22 284 lb 12.8 oz (129.2 kg)  09/04/22 278 lb (126.1 kg)     GEN:  Well nourished, well developed in no acute distress CARDIAC: RRR, no murmurs, rubs, gallops RESPIRATORY:  Clear to auscultation without rales, wheezing or rhonchi       ASSESSMENT:    1. Persistent atrial fibrillation (HCC)   2. Encounter for long-term (current) use of high-risk medication   3. Primary hypertension    PLAN:    In order of problems listed above:  #Persistent atrial fibrillation #High risk med monitoring-flecainide Doing well after his September 04, 2022 catheter ablation. Stop  flecainide Continue diltiazem Continue Eliquis until at least our next follow-up appointment.  If he has no A-fib from now until the 57-month follow-up appointment, can stop the anticoagulant, start aspirin 81 mg by mouth once daily and employ some form of heart rhythm monitoring (apple/Galaxy watch, loop recorder, Kardia)  Continue Ranexa for now.  If, at the 30-month follow-up appointment, he has not had any interval atrial fibrillation, plan to discontinue Ranexa.  #Hypertension At goal today.  Recommend checking blood pressures 1-2 times per week at home and recording the values.  Recommend bringing these recordings to the primary care physician.    Follow-up 6 months with APP.     Signed, Jose Dunn, MD, [facility], [facility] 12/06/2022 11:32 AM    Electrophysiology [facility]

## 2022-12-06 ENCOUNTER — Ambulatory Visit: Payer: BC Managed Care – PPO | Attending: Cardiology | Admitting: Cardiology

## 2022-12-06 ENCOUNTER — Encounter: Payer: Self-pay | Admitting: Cardiology

## 2022-12-06 VITALS — BP 124/62 | HR 67 | Ht 72.0 in | Wt 281.0 lb

## 2022-12-06 DIAGNOSIS — I10 Essential (primary) hypertension: Secondary | ICD-10-CM | POA: Diagnosis not present

## 2022-12-06 DIAGNOSIS — Z79899 Other long term (current) drug therapy: Secondary | ICD-10-CM

## 2022-12-06 DIAGNOSIS — I4819 Other persistent atrial fibrillation: Secondary | ICD-10-CM | POA: Diagnosis not present

## 2022-12-06 NOTE — Patient Instructions (Signed)
Medication Instructions:  Your physician has recommended you make the following change in your medication:  1) STOP taking flecainide   *If you need a refill on your cardiac medications before your next appointment, please call your pharmacy*  Follow-Up: At [facility], you and your health needs are our priority.  As part of our continuing mission to provide you with exceptional heart care, we have created designated Provider Care Teams.  These Care Teams include your primary Cardiologist (physician) and Advanced Practice Providers (APPs -  Physician Assistants and Nurse Practitioners) who all work together to provide you with the care you need, when you need it.  Your next appointment:   6 month(s)  Provider:   Sherie Don, NP

## 2022-12-31 ENCOUNTER — Other Ambulatory Visit: Payer: Self-pay | Admitting: Internal Medicine

## 2023-01-11 NOTE — Telephone Encounter (Signed)
Last visit with me 10/2021.  Sleep study and CPAP equipment all require office visit prior to ordering for insurance documentation.  Also I would need more information on where he needs sleep study done etc locations in network.  Usually we use [facility]. In [place].  Please schedule to follow-up so we can order any test or CPAP equipment he needs.  Saralyn Pilar, DO [facility] 01/11/2023, 3:56 PM

## 2023-01-11 NOTE — Telephone Encounter (Unsigned)
Copied from CRM [id number]. Topic: General - Inquiry >> Jan 11, 2023  1:22 PM Marlow Baars wrote: Reason for CRM: The patient called in requesting a new order for a sleep study as his previous one ran out. His insurance is requiring him to get this order to get a sleep study done as soon as possible. Please assist patient further.

## 2023-01-15 ENCOUNTER — Other Ambulatory Visit: Payer: Self-pay | Admitting: Internal Medicine

## 2023-01-17 ENCOUNTER — Ambulatory Visit (INDEPENDENT_AMBULATORY_CARE_PROVIDER_SITE_OTHER): Payer: BC Managed Care – PPO | Admitting: Urology

## 2023-01-17 ENCOUNTER — Other Ambulatory Visit
Admission: RE | Admit: 2023-01-17 | Discharge: 2023-01-17 | Disposition: A | Discharge status: Home / Self Care | Payer: BC Managed Care – PPO | Attending: Urology | Admitting: Urology

## 2023-01-17 ENCOUNTER — Encounter: Payer: Self-pay | Admitting: Urology

## 2023-01-17 VITALS — BP 144/83 | HR 97 | Ht 72.0 in | Wt 278.0 lb

## 2023-01-17 DIAGNOSIS — N200 Calculus of kidney: Secondary | ICD-10-CM

## 2023-01-17 DIAGNOSIS — Z09 Encounter for follow-up examination after completed treatment for conditions other than malignant neoplasm: Secondary | ICD-10-CM | POA: Diagnosis not present

## 2023-01-17 DIAGNOSIS — R1084 Generalized abdominal pain: Secondary | ICD-10-CM | POA: Insufficient documentation

## 2023-01-17 DIAGNOSIS — Z87442 Personal history of urinary calculi: Secondary | ICD-10-CM

## 2023-01-17 NOTE — Progress Notes (Signed)
   01/17/23 3:17 PM   Bari Mantis 1960/07/15 [id number]  CC: Nephrolithiasis  HPI: 62 year old male who made an appointment for recurrent nephrolithiasis.  He has had recurrent kidney stones since at least age 89, typically would have 1 every 2 to 4 years, however these have increased over the last few years.  He thinks he has passed at least 5 stones in the last year, and he brought a number of large stones with him today, the largest measuring at least 1 cm.  When he passes a stone his primary symptoms are severe flank pain.  He has never required surgery for kidney stones, no prior imaging to review.  He denies any urinary symptoms.   PMH: Past Medical History:  Diagnosis Date   Atrial fibrillation (HCC)    Cardiomyopathy    Resolved 05/03/09   Hyperlipidemia    Hypertension 03/03/1996   Kidney stone    Sleep apnea     Surgical History: Past Surgical History:  Procedure Laterality Date   ATRIAL FIBRILLATION ABLATION N/A 09/04/2022   Procedure: ATRIAL FIBRILLATION ABLATION;  Surgeon: Lanier Prude, MD;  Location: [facility];  Service: Cardiovascular;  Laterality: N/A;   CARDIOVERSION      Family History: Family History  Problem Relation Age of Onset   Hypertension Father    Hypertension Mother    Heart disease Mother    Cancer Mother    Diabetes Neg Hx    Coronary artery disease Neg Hx    Colon cancer Neg Hx    Prostate cancer Neg Hx     Social History:  reports that he has never smoked. He has never been exposed to tobacco smoke. He has never used smokeless tobacco. He reports current alcohol use of about 4.0 - 5.0 standard drinks of alcohol per week. He reports that he does not use drugs.  Physical Exam: BP (!) 144/83   Pulse 97   Ht 6' (1.829 m)   Wt 278 lb (126.1 kg)   BMI 37.70 kg/m    Constitutional:  Alert and oriented, No acute distress. Cardiovascular: No clubbing, cyanosis, or edema. Respiratory: Normal respiratory effort, no increased  work of breathing. GI: Abdomen is soft, nontender, nondistended, no abdominal masses  Assessment & Plan:   62 year old male with significant recurrent stone disease with at least 5 large stones passed in the last year, no prior imaging to review.  Currently asymptomatic.  I recommended a CT stone protocol for further evaluation of his stone burden based on the significant number of stones he has passed in the last 5 years.  I also recommended sending one of his stones today for stone analysis for better tailoring of prevention recommendations, as well as pursuing a 24-hour urine metabolic workup  Stone for analysis CT stone protocol to evaluate stone burden 24-hour urine metabolic workup RTC 1-2 months to review above results   Legrand Rams, MD 01/17/2023  [facility] [address], [address] [place], [place] [address] [phone]

## 2023-01-17 NOTE — Patient Instructions (Signed)

## 2023-01-25 ENCOUNTER — Other Ambulatory Visit: Payer: Self-pay | Admitting: Cardiology

## 2023-01-25 DIAGNOSIS — I480 Paroxysmal atrial fibrillation: Secondary | ICD-10-CM

## 2023-01-25 NOTE — Telephone Encounter (Signed)
Eliquis 5mg  refill request received. Patient is 62 years old, weight-126.1kg, Crea-0.90 on 08/23/22, Diagnosis-Afib, and last seen by Dr. Lalla Brothers on 12/06/22. Dose is appropriate based on dosing criteria. Will send in refill to requested pharmacy.

## 2023-01-26 ENCOUNTER — Ambulatory Visit: Admission: RE | Admit: 2023-01-26 | Payer: BC Managed Care – PPO | Source: Ambulatory Visit

## 2023-01-26 DIAGNOSIS — R1084 Generalized abdominal pain: Secondary | ICD-10-CM | POA: Diagnosis not present

## 2023-02-06 ENCOUNTER — Other Ambulatory Visit: Payer: BC Managed Care – PPO

## 2023-02-06 DIAGNOSIS — N200 Calculus of kidney: Secondary | ICD-10-CM

## 2023-02-06 DIAGNOSIS — R1084 Generalized abdominal pain: Secondary | ICD-10-CM

## 2023-02-14 ENCOUNTER — Ambulatory Visit: Payer: BC Managed Care – PPO | Attending: Family

## 2023-02-14 DIAGNOSIS — M542 Cervicalgia: Secondary | ICD-10-CM | POA: Insufficient documentation

## 2023-02-14 NOTE — Therapy (Unsigned)
OUTPATIENT PHYSICAL THERAPY NECK SCREEN  Patient Name: Jose Garcia MRN: [id number] DOB:August 14, 1960, 62 y.o., male Today's Date: 02/15/2023  Past Medical History:  Diagnosis Date   Atrial fibrillation (HCC)    Cardiomyopathy    Resolved 05/03/09   Hyperlipidemia    Hypertension 03/03/1996   Kidney stone    Sleep apnea    Past Surgical History:  Procedure Laterality Date   ATRIAL FIBRILLATION ABLATION N/A 09/04/2022   Procedure: ATRIAL FIBRILLATION ABLATION;  Surgeon: Lanier Prude, MD;  Location: [facility];  Service: Cardiovascular;  Laterality: N/A;   CARDIOVERSION     Patient Active Problem List   Diagnosis Date Noted   OSA on CPAP 10/08/2019   Post-nasal drainage 10/08/2019   Morbid obesity (HCC) 05/05/2015   ED (erectile dysfunction) 05/05/2015   Cardiomyopathy (HCC) 11/06/2014   EDEMA 01/07/2010   Paroxysmal atrial fibrillation (HCC) 01/22/2009   HYPERCHOLESTEROLEMIA 12/31/2007   Essential hypertension 12/31/2007   PCP: Smitty Cords, DO  REFERRING PROVIDER: Ronne Binning, NP  REFERRING DIAGNOSIS: Cervicalgia  THERAPY DIAG: Cervicalgia  RATIONALE FOR EVALUATION AND TREATMENT: Rehabilitation  ONSET DATE: Approximately 10-12 years ago  FOLLOW UP APPT WITH PROVIDER: Yes, plans to see MD for a physical this year  FROM INITIAL EVALUATION (07/19/22) SUBJECTIVE:                                                                                                                                                                                         Chief Complaint: Neck pain and limited motion;  Pertinent History Pt referred to physical therapy for limited neck mobility and pain. Mobility deficits have progressed for the last 10-12 years. He had seen chiropractor in the past but is not seeing one currently. No prior history of head or neck surgery. He has had both a cervical CT and MRI which showed multilevel degenerative changes of the cervical  spine. This included OPLL with resulting severe spinal canal stenosis and cord compression at C3-C4 and C4-C5 without underlying cord signal abnormality. Foraminal narrowing, severe on the right at C3-C4 and C4-C5 and moderate at other levels as above. Large, bulky osteophytes along the anterior aspect of the upper cervical spine extending from approximately C1-C5. There is mass effect on the posterior aspect of the oropharynx. Pt is having difficulty swallowing and reports that his esophagus feels "closed up." Pt has consulted with neurosurgery but per pt report none of the surgeons have encouraged him to proceed with surgery given the high risk of worsening his symptoms.  MRI CERVICAL SPINE WITHOUT CONTRAST (01/20/22) FINDINGS:  Alignment: Normal alignment  Bone marrow Signal: no suspicious lesions  Spinal cord: Normal signal. There is complete effacement of the anterior  and posterior CSF space at the C3-C4 (9:24) and C4-C5 (9:18) levels  resulting in cord compression. There is no underlying cord signal  abnormality.  Flowing anterior osteophytes consistent with DISH. OPLL.  Paraspinal Soft Tissues: Mild prevertebral edema.  Occiput-C1: Mild degenerative changes.  Atlanto-dental interval: Degenerative narrowing  C1-2 lateral masses: no significant degenerative change  C2-C3: Disc osteophyte complex. Moderate facet arthrosis, right greater  than left. Mild spinal canal stenosis. No foraminal narrowing.  C3-C4: Disc osteophyte complex. Severe spinal canal stenosis and cord  compression. Severe right and moderate left foraminal narrowing.  C4-C5: OPLL. Severe spinal canal stenosis and cord compression. Severe  right and mild left foraminal narrowing.  C5-C6: OPLL. Moderate spinal canal stenosis. Mild bilateral foraminal  narrowing.  C6-C7: OPLL. Moderate spinal canal stenosis. No foraminal narrowing.  C7-T1: no significant stenosis   IMPRESSION:  1. Multilevel degenerative changes of the  cervical spine. OPLL with  resulting severe spinal canal stenosis and cord compression at C3-C4 and  C4-C5. No underlying cord signal abnormality.  2.  Foraminal narrowing, severe on the right at C3-C4 and C4-C5 and  moderate at other levels as above.   Procedure: CT CERVICAL SPINE WITHOUT CONTRAST (01/20/22) CERVICAL SPINE FINDINGS:  Alignment: Normal.  Occipital Condyles: Intact.  Bones: No fractures or facet dislocations.  Intervertebral Discs: Normal.  Prevertebral soft tissues: No prevertebral soft tissue swelling.  Regional Soft Tissues: The regional soft tissues are unremarkable.  Lung Apices: The partially visualized lung apices are clear.  Skull Base: The partially imaged skull base is unremarkable.  Degenerative changes:  Large, bulky osteophytes along the anterior aspect of the upper cervical  spine extending from approximately C1-C5. There is mass effect on the  posterior aspect of the oropharynx. There is cervical spine DISH and OPLL  extending from C4-T1.  C1-C2: Extensive heterotopic osseous formation about the atlantodens  interval.  C2-C3: Uncovertebral degenerative changes. No high-grade spinal canal  stenosis.  C3-C4: Uncovertebral and facet degenerative changes. OPLL results in severe  canal stenosis.  C4-C5: Uncovertebral and facet degenerative changes as well as OPLL,  resulting in severe canal stenosis.  C5-C6: Facet and uncovertebral degenerative changes. OPLL.  C6-C7: Facet and uncovertebral degenerative change. OPLL. Mild canal  stenosis.  C7-T1: No high-grade spinal canal or neuroforaminal stenosis.   IMPRESSION:  Extensive degenerative changes of the cervical spine with multilevel DISH  and OPLL. There is severe spinal canal stenosis at C3-C4 and C4-C5 better  evaluated on same day MRI.   Pain:  Pain Intensity: Present: 0/10, Best: 0/10, Worst: 10/10 Pain location: R upper trap extending to the inside of the R shoulder blade, tightness on the left  side of neck; Pain Quality: general tightness and muscle strain Radiating: Yes, Pain occasionally radiates from the R side of his neck to the top of his R shoulder; Numbness/Tingling: Yes, R side of neck and top of R shoulder, 2x/month numbness in R hand Focal Weakness: No Aggravating factors: Getting into/out of cars due to laterally flexing and rotating his head (takes a day or two to recover), most activities increase his pain, no real change with TENS Relieving factors: rest after strain, Tylenol/ibuprofen, no significant difference with muscle relaxers, heat, ice helps when pt has significant muscle spasms, massage (self-massage and pt has had professional massage as well), minimal benefit from PT multiple years ago.  24-hour pain behavior: pain is worse at the end of the day  History of prior neck injury, pain, surgery, or therapy: No, no surgery or notable trauma to neck Falls: Has patient fallen in last 6 months? 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  Recent memory is intact.  Remote memory is intact.  Attention span and concentration are intact.  Expressive speech is intact.  Patient's fund of knowledge is within normal limits for educational  level.    Gross Musculoskeletal Assessment Tremor: None Bulk: Normal Tone: Normal  Gait Deferred  Posture Upper thoracic kyphosis noted with forward head and rounded shoulders. Unable to correct with cues. Limited spontaneous cervical motion noted  AROM AROM (Normal range in degrees) AROM 07/19/2022  Cervical  Flexion (50) 25  Extension (80) 8  Right lateral flexion (45) 10  Left lateral flexion (45) 6  Right rotation (85) 15  Left rotation (85) 16  (* = pain; Blank rows = not tested)  MMT MMT (out of 5) Right 07/19/2022 Left 07/19/2022  Cervical (isometric)  Flexion WNL  Extension WNL  Lateral Flexion WNL WNL  Rotation WNL WNL      Shoulder   Flexion 5 5  Extension    Abduction 5 5  Internal rotation    Horizontal abduction    Horizontal adduction    Lower Trapezius    Rhomboids        Elbow  Flexion 5 5  Extension 5 5  Pronation    Supination        Wrist  Flexion 5 5  Extension 5 5  Radial deviation    Ulnar deviation        MCP  Flexion 5 5  Extension 5 5  Abduction 5 5  Adduction 5 5  (* = pain; Blank rows = not tested)  Palpation Location LEFT  RIGHT           Suboccipitals 0 0  Cervical paraspinals 0 0  Upper Trapezius 0 0  Levator Scapulae 0 0  Rhomboid Major/Minor 0 0  (Blank rows = not tested) Graded on 0-4 scale (0 = no pain, 1 = pain, 2 = pain with wincing/grimacing/flinching, 3 = pain with withdrawal, 4 = unwilling to allow palpation), (Blank rows = not tested)  SPECIAL TESTS Spurlings A (ipsilateral lateral flexion/axial compression): R: Negative L: Negative Spurlings B (ipsilateral lateral flexion/contralateral rotation/axial compression): R: Negative L: Negative Distraction Test: Not examined  Hoffman Sign (cervical cord compression): R: Not examined L: Not examined ULTT Median: R: Not examined L: Not examined ULTT Ulnar: R: Not examined L: Not examined ULTT Radial: R: Not examined L: Not examined   TODAY'S TREATMENT     SUBJECTIVE: Pt reports that he has been doing well over the last few months. No more episodes of muscle spasms. No significant changes in neck pain or motion over the last 3 months.  No pain upon arrival.    PAIN: Denies pain   Updated outcome measures: Cervical range of motion: flexion: 22 extension: 12, lateral flexion (R/L): 25/16, rotation (R/L): 44/38; Worst pain: 2/10; NDI: 10%   PATIENT EDUCATION:  Education details: No need for additional therapy Person educated: Patient Education method: Explanation Education comprehension: verbalized understanding   HOME EXERCISE PROGRAM: Access Code: [email or web address] URL: [email or web address] Date: 07/26/2022 Prepared by: Ria Comment  Exercises - Seated Cervical Retraction  - 1-2 x daily - 7 x weekly - 2 sets - 10 reps - 3s hold - Seated Scapular Retraction  - 1-2 x daily - 7 x weekly - 2 sets - 10 reps - 3s hold - Seated Cervical Sidebending Stretch  - 4-6 x daily - 7 x weekly - 2-3 reps - 45-60s hold - Seated Assisted Cervical Rotation with Towel  - 4-6 x daily - 7 x weekly - 2-3 reps - 45-60s hold   ASSESSMENT:  CLINICAL IMPRESSION: Updated outcome measures with patient during screen today. His cervical range of motion, pain, and NDI have remained consistent over the last 3 months since his last therapy appointment. Pt feels confident with his home program. No indication for additional therapy at this time. Pt encouraged to continue his HEP independently and reschedule if regression occurs. If he regresses will consider referral to neurology to consider whether he is appropriate for botox injections.      PLAN: No need for additional therapy at this time   Lynnea Maizes PT, DPT, GCS  Taylinn Brabant 02/15/2023, 11:52 AM

## 2023-02-15 ENCOUNTER — Other Ambulatory Visit: Payer: Self-pay | Admitting: Family Medicine

## 2023-02-15 ENCOUNTER — Ambulatory Visit (INDEPENDENT_AMBULATORY_CARE_PROVIDER_SITE_OTHER): Payer: BC Managed Care – PPO | Admitting: Family Medicine

## 2023-02-15 ENCOUNTER — Encounter: Payer: Self-pay | Admitting: Family Medicine

## 2023-02-15 VITALS — BP 132/63 | HR 81 | Temp 96.8°F | Ht 72.0 in | Wt 278.2 lb

## 2023-02-15 DIAGNOSIS — I480 Paroxysmal atrial fibrillation: Secondary | ICD-10-CM | POA: Diagnosis not present

## 2023-02-15 DIAGNOSIS — E7800 Pure hypercholesterolemia, unspecified: Secondary | ICD-10-CM

## 2023-02-15 DIAGNOSIS — G4733 Obstructive sleep apnea (adult) (pediatric): Secondary | ICD-10-CM | POA: Diagnosis not present

## 2023-02-15 DIAGNOSIS — I10 Essential (primary) hypertension: Secondary | ICD-10-CM | POA: Diagnosis not present

## 2023-02-15 DIAGNOSIS — Z125 Encounter for screening for malignant neoplasm of prostate: Secondary | ICD-10-CM

## 2023-02-15 DIAGNOSIS — R7309 Other abnormal glucose: Secondary | ICD-10-CM

## 2023-02-15 DIAGNOSIS — Z0000 Encounter for general adult medical examination without abnormal findings: Secondary | ICD-10-CM

## 2023-02-15 NOTE — Assessment & Plan Note (Signed)
Well controlled, chronic OSA on CPAP - Good adherence to CPAP nightly - Continue current CPAP therapy, patient seems to be benefiting from therapy  Due for new CPAP machine, >7 yr since last.  Due for repeat sleep study with known obstructive sleep apnea given reported symptoms with witnessed apnea, snoring and sleep disturbance, fatigue excessive sleepiness.  Agree to proceed with sleep study testing based on clinical concerns with needing new CPAP machine - will fax referral to [facility] - to arrange CPAP Titration Split Night, send them copy of last PSG

## 2023-02-15 NOTE — Assessment & Plan Note (Signed)
Stable without any recent flare up or episode Followed by [facility] Dr Graciela Husbands On rate control, Diltiazem, anticoag Eliquis

## 2023-02-15 NOTE — Patient Instructions (Addendum)
Thank you for coming to the office today.  Referral sent via fax today. Stay tuned to hear back  DUE for FASTING BLOOD WORK (no food or drink after midnight before the lab appointment, only water or coffee without cream/sugar on the morning of)  SCHEDULE "Lab Only" visit in the morning at the clinic for lab draw on 03/16/23  - Make sure Lab Only appointment is at about 1 week before your next appointment, so that results will be available  For Lab Results, once available within 2-3 days of blood draw, you can can log in to MyChart online to view your results and a brief explanation. Also, we can discuss results at next follow-up visit.   Please schedule a Follow-up Appointment to: Return if symptoms worsen or fail to improve.  If you have any other questions or concerns, please feel free to call the office or send a message through MyChart. You may also schedule an earlier appointment if necessary.  Additionally, you may be receiving a survey about your experience at our office within a few days to 1 week by e-mail or mail. We value your feedback.  Saralyn Pilar, DO [facility], [place]

## 2023-02-15 NOTE — Progress Notes (Addendum)
Subjective:    Patient ID: Jose Garcia, male    DOB: 1961-05-05, 62 y.o.   MRN: [id number]  Jose Garcia is a 62 y.o. male presenting on 02/15/2023 for Referral (Needs new referral for sleep study)   HPI  Paroxysmal Atrial Fibrillation Followed by Dr Graciela Husbands [facility] - Currently reports that his AFib is well controlled. Currently not experiencing any arrhythmia - Meds: Diltiazem 24 hr 120mg  daily, Ranolazine (Ranexa) 500mg  TWICE A DAY, Eliquis 5mg  TWICE A DAY,  No significant known CAD. He had prior CT Cardiac score 0  He is doing well currently without symptoms or problem.   CHRONIC HTN: Reports home BP controlled, usually has higher reading at doctors office. Current Meds - Diltiazem, Losartan 100mg , HCTZ 25mg  daily - see meds above for AFib as well.   Reports good compliance, took meds today. Tolerating well, w/o complaints. Lifestyle: - Diet: Weight watchers, diet, limiting portions, calories - Exercise: Not regular. Previously more treadmill exercise - causes him some hip pain Denies CP, dyspnea, HA, edema, dizziness / lightheadedness    OSA, on CPAP - Patient reports prior history of dx OSA and on CPAP for years, prior to treatment initial symptoms were snoring, daytime sleepiness and fatigue, has had sleep study PSG in 09/2015 scanned into chart - Today reports that sleep apnea is well controlled. Uses the CPAP machine every night. Tolerates the machine well, and thinks that sleeps better with it and feels good.  His machine is 62 years old and he is requesting update. Needs new sleep study for new machine.  ------------------  WITHOUT CPAP MACHINE Epworth Sleepiness Scale Total Score: 13 Sitting and reading - 3 Watching TV - 3 Sitting inactive in a public place - 2 As a passenger in a car for an hour without a break - 3 Lying down to rest in the afternoon when circumstances permit - 2 Sitting and talking to someone - 0 Sitting quietly after a lunch  without alcohol - 0 In a car, while stopped for a few minutes in traffic - 0  STOP-Bang OSA scoring Snoring yes   Tiredness yes   Observed apneas yes   Pressure HTN yes   BMI > 35 kg/m2 yes   Age > 50  yes   Neck (male >17 in; Male >16 in)  yes   Gender male yes   OSA risk low (0-2)  OSA risk intermediate (3-4)  OSA risk high (5+)  Total: 8 high risk        02/15/2023   10:32 AM 11/09/2021    8:31 AM 11/19/2019    9:27 AM  Depression screen PHQ 2/9  Decreased Interest 0 0 0  Down, Depressed, Hopeless 0 0 0  PHQ - 2 Score 0 0 0  Altered sleeping 0 0   Tired, decreased energy 0 0   Change in appetite 0 0   Feeling bad or failure about yourself  0 0   Trouble concentrating 0 0   Moving slowly or fidgety/restless 0 0   Suicidal thoughts 0 0   PHQ-9 Score 0 0   Difficult doing work/chores Not difficult at all Not difficult at all     Social History   Tobacco Use   Smoking status: Never    Passive exposure: Never   Smokeless tobacco: Never   Tobacco comments:    Never smoke 10/02/22  Vaping Use   Vaping status: Never Used  Substance Use Topics   Alcohol  use: Yes    Alcohol/week: 4.0 - 5.0 standard drinks of alcohol    Types: 4 - 5 Standard drinks or equivalent per week    Comment: 4-5 mixed drinks weekly 10/02/22   Drug use: No    Review of Systems Per HPI unless specifically indicated above     Objective:    BP 132/63   Pulse 81   Temp (!) 96.8 F (36 C) (Temporal)   Ht 6' (1.829 m)   Wt 278 lb 3.2 oz (126.2 kg)   SpO2 97%   BMI 37.73 kg/m   Wt Readings from Last 3 Encounters:  02/15/23 278 lb 3.2 oz (126.2 kg)  01/17/23 278 lb (126.1 kg)  12/06/22 281 lb (127.5 kg)    Physical Exam Vitals and nursing note reviewed.  Constitutional:      General: He is not in acute distress.    Appearance: Normal appearance. He is well-developed. He is not diaphoretic.     Comments: Well-appearing, comfortable, cooperative  HENT:     Head: Normocephalic and  atraumatic.  Eyes:     General:        Right eye: No discharge.        Left eye: No discharge.     Conjunctiva/sclera: Conjunctivae normal.  Cardiovascular:     Rate and Rhythm: Normal rate.  Pulmonary:     Effort: Pulmonary effort is normal.  Skin:    General: Skin is warm and dry.     Findings: No erythema or rash.  Neurological:     Mental Status: He is alert and oriented to person, place, and time.  Psychiatric:        Mood and Affect: Mood normal.        Behavior: Behavior normal.        Thought Content: Thought content normal.     Comments: Well groomed, good eye contact, normal speech and thoughts      Results for orders placed or performed in visit on 02/06/23  Litholink Serum Panel  Result Value Ref Range   Uric Acid 4.6 3.8 - 8.4 mg/dL   Creatinine, Ser 6.57 0.76 - 1.27 mg/dL   eGFR 846 >96 EX/BMW/4.13   Sodium 141 134 - 144 mmol/L   Potassium 3.8 3.5 - 5.2 mmol/L   Chloride 100 96 - 106 mmol/L   CO2 26 20 - 29 mmol/L   Calcium 9.4 8.6 - 10.2 mg/dL   Phosphorus 2.9 2.8 - 4.1 mg/dL   Magnesium 2.0 1.6 - 2.3 mg/dL      Assessment & Plan:   Problem List Items Addressed This Visit     Essential hypertension    Controlled - Home BP readings normal  No complication Off benazepril on Losartan Off Spiro   Plan:  1. Continue current BP regimen 2. Encourage improved lifestyle - low sodium diet, regular exercise 3. Continue monitor BP outside office, bring readings to next visit, if persistently >140/90 or new symptoms notify office sooner 4. Follow-up after cardiology in future can adjust further if need       OSA on CPAP - Primary    Well controlled, chronic OSA on CPAP - Good adherence to CPAP nightly - Continue current CPAP therapy, patient seems to be benefiting from therapy  Due for new CPAP machine, >7 yr since last.  Due for repeat sleep study with known obstructive sleep apnea given reported symptoms with witnessed apnea, snoring and sleep  disturbance, fatigue excessive sleepiness.  Agree to proceed  with sleep study testing based on clinical concerns with needing new CPAP machine  Will submit order request for CPAP Titration or Split Night Study to obtain new CPAP orders / parameters for future equipment renewal.        Paroxysmal atrial fibrillation (HCC)    Stable without any recent flare up or episode Followed by [facility] [place] Dr Graciela Husbands On rate control, Diltiazem, anticoag Eliquis       No orders of the defined types were placed in this encounter.     Follow up plan: Return if symptoms worsen or fail to improve.  Future labs ordered for 03/16/23  Saralyn Pilar, DO [facility] 02/15/2023, 11:10 AM

## 2023-02-15 NOTE — Assessment & Plan Note (Signed)
Controlled - Home BP readings normal  No complication Off benazepril on Losartan Off Spiro   Plan:  1. Continue current BP regimen 2. Encourage improved lifestyle - low sodium diet, regular exercise 3. Continue monitor BP outside office, bring readings to next visit, if persistently >140/90 or new symptoms notify office sooner 4. Follow-up after cardiology in future can adjust further if need

## 2023-02-24 ENCOUNTER — Other Ambulatory Visit: Payer: Self-pay | Admitting: Internal Medicine

## 2023-03-06 ENCOUNTER — Encounter: Payer: Self-pay | Admitting: Family Medicine

## 2023-03-06 DIAGNOSIS — G4733 Obstructive sleep apnea (adult) (pediatric): Secondary | ICD-10-CM

## 2023-03-06 NOTE — Telephone Encounter (Signed)
Jose Garcia,  I saw this patient on 02/15/23 for OSA follow-up and new sleep study order for updating his CPAP.  I signed orders to be faxed to [facility], and the patient has not heard back yet.  He is now requesting to go to [facility].  I have not had good success communicating with them in the past.  I have signed an order in Epic.  Could you follow-up with the order and assist with scheduling this patient with [facility] for sleep study? I am not exactly sure the best work flow for this order.  Saralyn Pilar, DO [facility] 03/06/2023, 7:26 PM

## 2023-03-07 ENCOUNTER — Encounter: Payer: Self-pay | Admitting: Urology

## 2023-03-07 ENCOUNTER — Ambulatory Visit: Payer: BC Managed Care – PPO | Admitting: Urology

## 2023-03-07 VITALS — BP 157/82 | HR 79 | Ht 72.0 in | Wt 278.0 lb

## 2023-03-07 DIAGNOSIS — Z09 Encounter for follow-up examination after completed treatment for conditions other than malignant neoplasm: Secondary | ICD-10-CM | POA: Diagnosis not present

## 2023-03-07 DIAGNOSIS — Z87442 Personal history of urinary calculi: Secondary | ICD-10-CM | POA: Diagnosis not present

## 2023-03-07 DIAGNOSIS — N200 Calculus of kidney: Secondary | ICD-10-CM

## 2023-03-07 MED ORDER — ALLOPURINOL 300 MG PO TABS: 300.0000 mg | ORAL_TABLET | Freq: Every day | ORAL | 11 refills | Status: DC

## 2023-03-07 NOTE — Patient Instructions (Addendum)
Your keys for stone prevention include decreasing salt in the diet, increasing fluid intake, and adding citrate to the diet through either litholyte(can be ordered off Amazon) or Crystal light lemonade.  Would also recommend decreasing meat intake(beef, chicken, pork, and fish), and avoiding high oxalate foods like spinach or nuts.  Continue to drink plenty of fluids, goal is to have clear urine.  [facility] Instructions [facility]   You will receive a box/kit in the mail that will have a urine jug and instructions in the kit.  When the box arrives you will need to call our office [phone] to schedule a LAB appointment.   You will need to do a 24hour urine and this should be done during the days that our office will be open.  For example any day from Sunday through Thursday.   If you take Vitamin C 100mg  or greater please stop this 5 days prior to collection.   How to collect the urine sample: On the day you start the urine sample this 1st morning urine should NOT be collected.  For the rest of the day including all night urines should be collected.  On the next morning the 1st urine should be collected and then you will be finished with the urine collections.   You will need to bring the box with you on your LAB appointment day after urine has been collected and all instructions are complete in the box.  Your blood will be drawn and the box will be collected by our Lab employee to be sent off for analysis.   When urine and blood is complete you will need to schedule a follow up appointment for lab results.

## 2023-03-07 NOTE — Progress Notes (Signed)
   03/07/2023 9:34 AM   Bari Mantis [birth year] [id number]  Reason for visit: Follow up recurrent nephrolithiasis  HPI: 61 year old male who I originally saw in July 2024 for recurrent nephrolithiasis.  He has had numerous stones since age 62, but this has increased over the last few years to at least 5 stones each year.  He brought a number of stones to clinic, and analysis showed 100% calcium oxalate monohydrate.  He has never required surgical intervention for kidney stones.  He is here with his daughter today.  I ordered a CT scan and 24-hour urine metabolic workup.  I personally viewed and interpreted the CT that shows no significant abnormalities, punctate left renal stones, no significant stone burden.  Renal function is normal with creatinine 0.9.  24-hour urine results notable for good urine volume of 2.8 L, normal urine calcium 185, elevated urine oxalate 53, low urine citrate 233, urine pH 5.6, elevated uric acid 1235, elevated urine sodium 278.  We discussed his risk factors of high urine sodium and high urine uric acid with calcium oxalate stones.  I also recommended avoiding high oxalate foods and increasing citrate in the diet through either Crystal light lemonade or litholyte.  I also recommended starting allopurinol per the AUA guidelines with an elevated urine uric acid and calcium oxalate stones with normal urine calcium.  Start allopurinol 300 mg daily for stone prevention Diet strategies discussed above RTC 6 months with 24-hour urine 1 month prior and KUB   Sondra Come, MD  [facility] [address] [place], [place] [address] [phone]

## 2023-03-07 NOTE — Addendum Note (Signed)
Addended by: Smitty Cords on: 03/07/2023 06:26 PM   Modules accepted: Orders

## 2023-03-13 ENCOUNTER — Ambulatory Visit: Payer: BC Managed Care – PPO | Attending: Otolaryngology

## 2023-03-13 DIAGNOSIS — G4733 Obstructive sleep apnea (adult) (pediatric): Secondary | ICD-10-CM | POA: Insufficient documentation

## 2023-03-13 DIAGNOSIS — I4891 Unspecified atrial fibrillation: Secondary | ICD-10-CM | POA: Insufficient documentation

## 2023-03-16 ENCOUNTER — Other Ambulatory Visit: Payer: BC Managed Care – PPO

## 2023-03-16 DIAGNOSIS — I10 Essential (primary) hypertension: Secondary | ICD-10-CM

## 2023-03-16 DIAGNOSIS — R7309 Other abnormal glucose: Secondary | ICD-10-CM

## 2023-03-16 DIAGNOSIS — I480 Paroxysmal atrial fibrillation: Secondary | ICD-10-CM

## 2023-03-16 DIAGNOSIS — Z125 Encounter for screening for malignant neoplasm of prostate: Secondary | ICD-10-CM

## 2023-03-16 DIAGNOSIS — E7800 Pure hypercholesterolemia, unspecified: Secondary | ICD-10-CM

## 2023-03-16 DIAGNOSIS — Z0000 Encounter for general adult medical examination without abnormal findings: Secondary | ICD-10-CM

## 2023-03-17 LAB — CBC WITH DIFFERENTIAL/PLATELET
Absolute Monocytes: 371 {cells}/uL (ref 200–950)
Basophils Absolute: 10 {cells}/uL (ref 0–200)
Basophils Relative: 0.3 %
Eosinophils Absolute: 51 {cells}/uL (ref 15–500)
Eosinophils Relative: 1.5 %
HCT: 43 % (ref 38.5–50.0)
Hemoglobin: 14.5 g/dL (ref 13.2–17.1)
Lymphs Abs: 789 {cells}/uL — ABNORMAL LOW (ref 850–3900)
MCH: 33 pg (ref 27.0–33.0)
MCHC: 33.7 g/dL (ref 32.0–36.0)
MCV: 97.7 fL (ref 80.0–100.0)
MPV: 11.9 fL (ref 7.5–12.5)
Monocytes Relative: 10.9 %
Neutro Abs: 2179 {cells}/uL (ref 1500–7800)
Neutrophils Relative %: 64.1 %
Platelets: 167 10*3/uL (ref 140–400)
RBC: 4.4 10*6/uL (ref 4.20–5.80)
RDW: 11.7 % (ref 11.0–15.0)
Total Lymphocyte: 23.2 %
WBC: 3.4 10*3/uL — ABNORMAL LOW (ref 3.8–10.8)

## 2023-03-17 LAB — LIPID PANEL
Cholesterol: 163 mg/dL (ref <200)
HDL: 42 mg/dL (ref >=40)
LDL Cholesterol (Calc): 104 mg/dL — ABNORMAL HIGH
Non-HDL Cholesterol (Calc): 121 mg/dL (ref <130)
Total CHOL/HDL Ratio: 3.9 (calc) (ref <5.0)
Triglycerides: 76 mg/dL (ref <150)

## 2023-03-17 LAB — COMPLETE METABOLIC PANEL WITH GFR
AG Ratio: 1.5 (calc) (ref 1.0–2.5)
ALT: 16 U/L (ref 9–46)
AST: 13 U/L (ref 10–35)
Albumin: 3.9 g/dL (ref 3.6–5.1)
Alkaline phosphatase (APISO): 45 U/L (ref 35–144)
BUN: 9 mg/dL (ref 7–25)
CO2: 29 mmol/L (ref 20–32)
Calcium: 8.9 mg/dL (ref 8.6–10.3)
Chloride: 105 mmol/L (ref 98–110)
Creat: 0.89 mg/dL (ref 0.70–1.35)
Globulin: 2.6 g/dL (ref 1.9–3.7)
Glucose, Bld: 100 mg/dL — ABNORMAL HIGH (ref 65–99)
Potassium: 3.9 mmol/L (ref 3.5–5.3)
Sodium: 142 mmol/L (ref 135–146)
Total Bilirubin: 0.5 mg/dL (ref 0.2–1.2)
Total Protein: 6.5 g/dL (ref 6.1–8.1)
eGFR: 97 mL/min/{1.73_m2} (ref >=60)

## 2023-03-17 LAB — HEMOGLOBIN A1C
Hgb A1c MFr Bld: 5.3 %{Hb} (ref <5.7)
Mean Plasma Glucose: 105 mg/dL
eAG (mmol/L): 5.8 mmol/L

## 2023-03-17 LAB — TSH: TSH: 2.07 m[IU]/L (ref 0.40–4.50)

## 2023-03-17 LAB — PSA: PSA: 0.57 ng/mL (ref <=4.00)

## 2023-03-20 ENCOUNTER — Ambulatory Visit (INDEPENDENT_AMBULATORY_CARE_PROVIDER_SITE_OTHER): Payer: BC Managed Care – PPO | Admitting: Family Medicine

## 2023-03-20 ENCOUNTER — Encounter: Payer: Self-pay | Admitting: Family Medicine

## 2023-03-20 VITALS — BP 110/66 | HR 60 | Ht 70.87 in | Wt 276.0 lb

## 2023-03-20 DIAGNOSIS — I10 Essential (primary) hypertension: Secondary | ICD-10-CM

## 2023-03-20 DIAGNOSIS — Z1211 Encounter for screening for malignant neoplasm of colon: Secondary | ICD-10-CM

## 2023-03-20 DIAGNOSIS — Z0000 Encounter for general adult medical examination without abnormal findings: Secondary | ICD-10-CM

## 2023-03-20 DIAGNOSIS — Z23 Encounter for immunization: Secondary | ICD-10-CM

## 2023-03-20 DIAGNOSIS — I480 Paroxysmal atrial fibrillation: Secondary | ICD-10-CM

## 2023-03-20 DIAGNOSIS — G4733 Obstructive sleep apnea (adult) (pediatric): Secondary | ICD-10-CM | POA: Diagnosis not present

## 2023-03-20 DIAGNOSIS — E7800 Pure hypercholesterolemia, unspecified: Secondary | ICD-10-CM

## 2023-03-20 DIAGNOSIS — Z87442 Personal history of urinary calculi: Secondary | ICD-10-CM | POA: Insufficient documentation

## 2023-03-20 NOTE — Progress Notes (Signed)
Subjective:    Patient ID: Jose Garcia, male    DOB: 07-07-1960, 62 y.o.   MRN: [id number]  Jose Garcia is a 62 y.o. male presenting on 03/20/2023 for Annual Exam   HPI  Here for Annual Physical and Lab Review.  Lab review A1c 5.3, stable over past 7 years   Paroxysmal Atrial Fibrillation Followed by Dr Graciela Husbands [facility] - Currently reports that his AFib is well controlled. Currently not experiencing any arrhythmia - Meds: Flecainide 100mg  BID, Diltiazem 24 hr 120mg  daily, Ranolazine (Ranexa) 500mg  BID No significant known CAD. He had prior CT Cardiac score 0 negative 08/2017 He is doing well currently without symptoms or problem.   CHRONIC HTN: Reports home BP controlled Side effect of effect with Benazepril, he has issue with this throat inflammation then thought due to esophagus Trial on Spiro then came off Current Meds - Diltiazem, Benazepril 20mg  BID, HCTZ 25mg  daily - see meds above for AFib as well.   Reports good compliance, took meds today. Tolerating well, w/o complaints. Lifestyle: - Diet: Weight watchers, diet, limiting portions, calories - Exercise: Not regular. Previously more treadmill exercise - causes him some hip pain Denies CP, dyspnea, HA, edema, dizziness / lightheadedness   HYPERLIPIDEMIA: - Reports no concerns. Overall improved diet less intake and healthier diet options. He follows with Cardiologist. They deferred statin last time as well Last lipid panel 2024, TC 190 to 160, LDL 120s down to 104 He has had some gradual weight loss Not on statin therapy On Omega 3    WBC Mild low result. 3.4, prior normal 5 to 6 range Asymptomatic    OSA, on CPAP He has completed recent Split Night CPAP Titration with [facility] Awaiting final results. And will need new CPAP machine - Today reports that sleep apnea is well controlled. Uses the CPAP machine every night. Tolerates the machine well, and thinks that sleeps better with  it and feels good. No new concerns or symptoms.  History of Nephrolithiasis  Followed by Urology He does improve his diet and preventing kidney stones, takes in Citrates / [employer] No new concern with stones On Allopurinol 300mg  Previously passed 3-4 per year. Now much improved. Has Tamsulosin ONLY if needed.     Health Maintenance:   Colon CA Screening: Last Cologuard negative 01/28/20. Due in 01/2023 - now due. Ordered today Cologuard.   Prostate CA Screening: Prior PSA 0.57 (2024). Prior range 0.6. Currently asymptomatic.  No known family history of prostate CA.    Shingles vaccine due in future.     03/20/2023    1:19 PM 02/15/2023   10:32 AM 11/09/2021    8:31 AM  Depression screen PHQ 2/9  Decreased Interest 0 0 0  Down, Depressed, Hopeless 0 0 0  PHQ - 2 Score 0 0 0  Altered sleeping 0 0 0  Tired, decreased energy 0 0 0  Change in appetite 0 0 0  Feeling bad or failure about yourself  0 0 0  Trouble concentrating 0 0 0  Moving slowly or fidgety/restless 0 0 0  Suicidal thoughts 0 0 0  PHQ-9 Score 0 0 0  Difficult doing work/chores Not difficult at all Not difficult at all Not difficult at all    Past Medical History:  Diagnosis Date   Atrial fibrillation (HCC)    Cardiomyopathy    Resolved 05/03/09   Hyperlipidemia    Hypertension 03/03/1996   Kidney stone  Sleep apnea    Past Surgical History:  Procedure Laterality Date   ATRIAL FIBRILLATION ABLATION N/A 09/04/2022   Procedure: ATRIAL FIBRILLATION ABLATION;  Surgeon: Lanier Prude, MD;  Location: [facility];  Service: Cardiovascular;  Laterality: N/A;   CARDIOVERSION     Social History   Socioeconomic History   Marital status: Married    Spouse name: Not on file   Number of children: 2   Years of education: Not on file   Highest education level: Not on file  Occupational History   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   Social Determinants of Health   Financial Resource Strain: Not on file  Food Insecurity: Not on file  Transportation Needs: Not on file  Physical Activity: Not on file  Stress: Not on file  Social Connections: Not on file  Intimate Partner Violence: Not on file   Family History  Problem Relation Age of Onset   Hypertension Father    Hypertension Mother    Heart disease Mother    Cancer Mother    Diabetes Neg Hx    Coronary artery disease Neg Hx    Colon cancer Neg Hx    Prostate cancer Neg Hx    Current Outpatient Medications on File Prior to Visit  Medication Sig   allopurinol (ZYLOPRIM) 300 MG tablet Take 1 tablet (300 mg total) by mouth daily.   apixaban (ELIQUIS) 5 MG TABS tablet TAKE 1 TABLET(5 MG) BY MOUTH TWICE DAILY   diltiazem (CARDIZEM CD) 120 MG 24 hr capsule TAKE 1 CAPSULE BY MOUTH DAILY   fluticasone (FLONASE) 50 MCG/ACT nasal spray Place 2 sprays into both nostrils daily. Use for 4-6 weeks then stop and use seasonally or as needed.   furosemide (LASIX) 20 MG tablet TAKE 1 TABLET(20 MG) BY MOUTH DAILY AS NEEDED   hydrochlorothiazide (HYDRODIURIL) 25 MG tablet TAKE 1 TABLET(25 MG) BY MOUTH DAILY   loratadine (CLARITIN) 10 MG tablet Take 10 mg by mouth daily.   losartan (COZAAR) 100 MG tablet Take 1 tablet (100 mg total) by mouth daily.   ranolazine (RANEXA) 500 MG 12 hr tablet TAKE 1 TABLET(500 MG) BY MOUTH TWICE DAILY   tamsulosin (FLOMAX) 0.4 MG CAPS capsule Take 0.4 mg by mouth daily. (Patient not taking:  Reported on 03/20/2023)   No current facility-administered medications on file prior to visit.    Review of Systems  Constitutional:  Negative for activity change, appetite change, chills, diaphoresis, fatigue and fever.  HENT:  Negative for congestion and hearing loss.   Eyes:  Negative for visual disturbance.  Respiratory:  Negative for cough, chest tightness, shortness of breath and wheezing.   Cardiovascular:  Negative for chest pain, palpitations and leg swelling.  Gastrointestinal:  Negative for abdominal pain, constipation, diarrhea, nausea and vomiting.  Genitourinary:  Negative for dysuria, frequency and  hematuria.  Musculoskeletal:  Negative for arthralgias and neck pain.  Skin:  Negative for rash.  Neurological:  Negative for dizziness, weakness, light-headedness, numbness and headaches.  Hematological:  Negative for adenopathy.  Psychiatric/Behavioral:  Negative for behavioral problems, dysphoric mood and sleep disturbance.    Per HPI unless specifically indicated above     Objective:    BP 110/66   Pulse 60   Ht 5' 10.87" (1.8 m)   Wt 276 lb (125.2 kg)   SpO2 98%   BMI 38.64 kg/m   Wt Readings from Last 3 Encounters:  03/20/23 276 lb (125.2 kg)  03/07/23 278 lb (126.1 kg)  02/15/23 278 lb 3.2 oz (126.2 kg)    Physical Exam Vitals and nursing note reviewed.  Constitutional:      General: He is not in acute distress.    Appearance: He is well-developed. He is not diaphoretic.     Comments: Well-appearing, comfortable, cooperative  HENT:     Head: Normocephalic and atraumatic.     Right Ear: Tympanic membrane, ear canal and external ear normal. There is no impacted cerumen.     Left Ear: Tympanic membrane, ear canal and external ear normal. There is no impacted cerumen.  Eyes:     General:        Right eye: No discharge.        Left eye: No discharge.     Conjunctiva/sclera: Conjunctivae normal.     Pupils: Pupils are equal, round, and reactive to light.   Neck:     Thyroid: No thyromegaly.     Vascular: No carotid bruit.  Cardiovascular:     Rate and Rhythm: Normal rate and regular rhythm.     Pulses: Normal pulses.     Heart sounds: Normal heart sounds. No murmur heard.    Comments: No ectopy Pulmonary:     Effort: Pulmonary effort is normal. No respiratory distress.     Breath sounds: Normal breath sounds. No wheezing or rales.  Abdominal:     General: Bowel sounds are normal. There is no distension.     Palpations: Abdomen is soft. There is no mass.     Tenderness: There is no abdominal tenderness.  Musculoskeletal:        General: No tenderness. Normal range of motion.     Cervical back: Normal range of motion and neck supple.     Right lower leg: Edema (mild +1 ankles bilateral) present.     Left lower leg: Edema present.     Comments: Upper / Lower Extremities: - Normal muscle tone, strength bilateral upper extremities 5/5, lower extremities 5/5  Lymphadenopathy:     Cervical: No cervical adenopathy.  Skin:    General: Skin is warm and dry.     Findings: Lesion (non healing raised pearly skin lesion R cheek) present. No erythema or rash.  Neurological:     Mental Status: He is alert and oriented to person, place, and time.     Comments: Distal sensation intact to light touch all extremities  Psychiatric:        Mood and Affect: Mood normal.        Behavior: Behavior normal.        Thought Content: Thought content normal.     Comments: Well groomed, good eye contact, normal speech and thoughts     I have personally reviewed the radiology report from 08/28/22 Coronary CT Scan.   CLINICAL DATA:  Atrial fibrillation scheduled for ablation.  EXAM: Cardiac CTA   TECHNIQUE: A non-contrast, gated CT scan was obtained with axial slices of 3 mm through the heart for calcium scoring. Calcium scoring was performed using the Agatston method. A 120 kV retrospective, gated, contrast cardiac scan was obtained. Gantry rotation  speed was 250 msecs and collimation was 0.6 mm. Nitroglycerin was not given. A delayed scan was obtained 60 seconds after contrast injection to exclude left atrial appendage thrombus. The 3D dataset was reconstructed in 5% intervals of the 0-95% of the R-R cycle. Systolic and diastolic phases were analyzed on a dedicated workstation using MPR, MIP, and VRT modes. The patient received 80 cc of contrast.   FINDINGS: Image quality: Excellent   Noise artifact is: Limited.   Pulmonary Veins: There is normal pulmonary vein drainage into the left atrium (2 on the right (including a middle right PV) and 2 on the left) with ostial measurements as follows:   RUPV: Ostium 25 x 21 mm  area 3.9 cm2   RLPV:  Ostium 28 x 22 mm  area 4.6 cm2   LUPV:  Ostium 18 x 15 mm area 1.8 cm2   LLPV:  Ostium 23 x 19 mm  area 3.3 cm2   Left Atrium: The left atrial size is normal. There is no a small PFO. There is no thrombus in the left atrial appendage on contrast or delayed imaging. The esophagus runs in the left atrial midline and is not in proximity to any of the pulmonary vein ostia.   Coronary Arteries: CAC score of 0. Normal coronary origin. Left dominance. The study was performed without use of NTG and is insufficient for plaque evaluation.   Right Atrium: Right atrial size is within normal limits.   Right Ventricle: The right ventricular cavity is within normal limits.   Left Ventricle: The ventricular cavity size is within normal limits. There are no stigmata of prior infarction. There is no abnormal filling defect.   Pericardium: Normal thickness with no significant effusion or calcium present.   Pulmonary Artery: Normal caliber without proximal filling defect.   Cardiac valves: The aortic valve is trileaflet without significant calcification. The mitral valve is normal structure without significant calcification.   Aorta: Normal caliber with no significant disease.    Extra-cardiac findings: See attached radiology report for non-cardiac structures.   IMPRESSION: 1. There is normal pulmonary vein drainage into the left atrium with ostial measurements above.   2. There is no thrombus in the left atrial appendage.   3. The esophagus runs in the left atrial midline and is not in proximity to any of the pulmonary vein ostia.   4. Small PFO.   5. Normal coronary origin. Left dominance.   6. CAC score of 0.   Donato Schultz, MD   Electronically Signed: By: Donato Schultz M.D. On: 08/28/2022 17:02  -------------------------------------  CLINICAL DATA:  History of chronic kidney stones currently asymptomatic.   EXAM: CT ABDOMEN AND PELVIS WITHOUT CONTRAST   TECHNIQUE: Multidetector CT imaging of the abdomen and pelvis was performed following the standard protocol without IV contrast.   RADIATION DOSE REDUCTION: This exam was performed according to the departmental dose-optimization program which includes automated exposure control, adjustment of the mA and/or kV according to patient size and/or use of iterative reconstruction technique.   COMPARISON:  None Available.   FINDINGS: Lower chest: No acute abnormality   Hepatobiliary: Unremarkable noncontrast enhanced appearance of the hepatic parenchyma. Gallbladder is unremarkable. No biliary ductal dilation   Pancreas:  No pancreatic ductal dilation or evidence of acute inflammation   Spleen: No splenomegaly or focal splenic lesion   Adrenals/Urinary Tract: Bilateral adrenal glands appear normal.   Mild left pelvocaliectasis hydronephrosis with questionable tiny stone at the UPJ on image 48/2. Density of the right renal pelvis is slightly greater than normal.   Nonobstructive left renal calculi measure up to 3 mm   No ureteral calculi.   Stomach/Bowel: Stomach is unremarkable for degree of distension. No pathologic dilation of small or large bowel scattered left-sided colonic  diverticulosis without findings of acute diverticulitis   Vascular/Lymphatic: Aortic atherosclerosis. Smooth IVC contours. No pathologically enlarged abdominal or pelvic lymph nodes   Reproductive: Prostate is unremarkable.   Other: No significant abdominopelvic free fluid.   Musculoskeletal: Diffuse demineralization of bone multilevel degenerative changes spine. Degenerative change of the bilateral hips.   IMPRESSION: 1. Mild left pelvocaliectasis and hydronephrosis with questionable tiny stone at the UPJ. 2. Density of the right renal pelvis is slightly greater than normal, nonspecific but can be seen in the setting of pyelonephritis. Correlate with urinalysis and urine cytology. 3. Nonobstructive left renal calculi measure up to 3 mm. 4. Scattered left-sided colonic diverticulosis without findings of acute diverticulitis. 5.  Aortic Atherosclerosis (ICD10-I70.0).     Electronically Signed   By: Maudry Mayhew M.D.   On: 01/27/2023 11:47     Results for orders placed or performed in visit on 03/16/23  TSH  Result Value Ref Range   TSH 2.07 0.40 - 4.50 mIU/L  PSA  Result Value Ref Range   PSA 0.57 < OR = 4.00 ng/mL  CBC with Differential/Platelet  Result Value Ref Range   WBC 3.4 (L) 3.8 - 10.8 Thousand/uL   RBC 4.40 4.20 - 5.80 Million/uL   Hemoglobin 14.5 13.2 - 17.1 g/dL   HCT 40.9 81.1 - 91.4 %   MCV 97.7 80.0 - 100.0 fL   MCH 33.0 27.0 - 33.0 pg   MCHC 33.7 32.0 - 36.0 g/dL   RDW 78.2 95.6 - 21.3 %   Platelets 167 140 - 400 Thousand/uL   MPV 11.9 7.5 - 12.5 fL   Neutro Abs 2,179 1,500 - 7,800 cells/uL   Lymphs Abs 789 (L) 850 - 3,900 cells/uL   Absolute Monocytes 371 200 - 950 cells/uL   Eosinophils Absolute 51 15 - 500 cells/uL   Basophils Absolute 10 0 - 200 cells/uL   Neutrophils Relative % 64.1 %   Total Lymphocyte 23.2 %   Monocytes Relative 10.9 %   Eosinophils Relative 1.5 %   Basophils Relative 0.3 %  Lipid panel  Result Value Ref Range    Cholesterol 163 <200 mg/dL   HDL 42 > OR = 40 mg/dL   Triglycerides 76 <086 mg/dL   LDL Cholesterol (Calc) 104 (H) mg/dL (calc)   Total CHOL/HDL Ratio 3.9 <5.0 (calc)   Non-HDL Cholesterol (Calc) 121 <130 mg/dL (calc)  Hemoglobin V7Q  Result Value Ref Range   Hgb A1c MFr Bld 5.3 <5.7 % of total Hgb   Mean Plasma Glucose 105 mg/dL   eAG (mmol/L) 5.8 mmol/L  COMPLETE METABOLIC PANEL WITH GFR  Result Value Ref Range   Glucose, Bld 100 (H) 65 - 99 mg/dL   BUN 9 7 - 25 mg/dL   Creat 4.69 6.29 - 5.28 mg/dL   eGFR 97 > OR = 60 UX/LKG/4.01U2   BUN/Creatinine Ratio SEE NOTE: 6 - 22 (calc)   Sodium 142 135 - 146 mmol/L   Potassium  3.9 3.5 - 5.3 mmol/L   Chloride 105 98 - 110 mmol/L   CO2 29 20 - 32 mmol/L   Calcium 8.9 8.6 - 10.3 mg/dL   Total Protein 6.5 6.1 - 8.1 g/dL   Albumin 3.9 3.6 - 5.1 g/dL   Globulin 2.6 1.9 - 3.7 g/dL (calc)   AG Ratio 1.5 1.0 - 2.5 (calc)   Total Bilirubin 0.5 0.2 - 1.2 mg/dL   Alkaline phosphatase (APISO) 45 35 - 144 U/L   AST 13 10 - 35 U/L   ALT 16 9 - 46 U/L      Assessment & Plan:   Problem List Items Addressed This Visit     Essential hypertension   History of nephrolithiasis   HYPERCHOLESTEROLEMIA   Morbid obesity (HCC)   OSA on CPAP   Paroxysmal atrial fibrillation (HCC)   Other Visit Diagnoses     Annual physical exam    -  Primary   Need for influenza vaccination       Screen for colon cancer       Relevant Orders   Cologuard       Updated Health Maintenance information Reviewed recent lab results with patient Encouraged improvement to lifestyle with diet and exercise Goal of weight loss  Due for routine colon cancer screening.  Last Colonoscopy years ago Last Cologuard 01/2020, - Discussion today about recommendations for either Colonoscopy or Cologuard screening, benefits and risks of screening, interested in Cologuard, understands that if positive then recommendation is for diagnostic colonoscopy to follow-up. - Ordered  Cologuard today  Assessment and Plan       Hyperlipidemia Significant improvement in cholesterol levels, likely due to dietary changes and weight loss. No current use of statin therapy. Discussed the potential benefits of statin therapy in the context of atherosclerosis detected on coronary CT scan, but no immediate need identified. -Continue current lifestyle modifications.  Prediabetes Consistent fasting glucose levels around 100-103, HbA1c 5.3, stable over the past seven years. No signs of prediabetes. -Continue current lifestyle modifications.  Kidney Stones History of kidney stones, currently managed with Allopurinol and dietary modifications. No recent issues. -Continue Allopurinol and dietary modifications.  OSA, on CPAP In process to update his machine now Recently completed split night sleep study, awaiting results and potential CPAP machine. -Follow up on sleep study results and potential CPAP machine.  Skin Lesion Noted skin lesion, already scheduled for dermatology evaluation. -Follow up with dermatology as scheduled.  General Health Maintenance -Continue current medications as prescribed by cardiologist. -Annual physical scheduled for one year from now. -Check PSA annually for prostate cancer screening.       Orders Placed This Encounter  Procedures   Cologuard      No orders of the defined types were placed in this encounter.     Follow up plan: Return in about 1 year (around 03/19/2024) for 1 year fasting lab only then 1 week later Annual Physical (weds AM preferred).  Saralyn Pilar, DO [facility] 03/20/2023, 1:22 PM

## 2023-03-20 NOTE — Patient Instructions (Addendum)
Thank you for coming to the office today.  Keep up the great work  Based on improved cholesterol, no medication required at this time.  We discussed eventually may warrant statin or other med for cardiovascular risk reduction  ------------------------   Ordered the Cologuard (home kit) test for colon cancer screening. Stay tuned for further updates.  It will be shipped to you directly. If not received in 2-4 weeks, call us or the company.   If you send it back and no results are received in 2-4 weeks, call us or the company as well!   Colon Cancer Screening: - For all adults age 80+ routine colon cancer screening is highly recommended.     - Recent guidelines from American Cancer Society recommend starting age of 8 - Early detection of colon cancer is important, because often there are no warning signs or symptoms, also if found early usually it can be cured. Late stage is hard to treat.   - If Cologuard is NEGATIVE, then it is good for 3 years before next due - If Cologuard is POSITIVE, then it is strongly advised to get a Colonoscopy, which allows the GI doctor to locate the source of the cancer or polyp (even very early stage) and treat it by removing it. ------------------------- Follow instructions to collect sample, you may call the company for any help or questions, 24/7 telephone support at [phone].  DUE for FASTING BLOOD WORK (no food or drink after midnight before the lab appointment, only water or coffee without cream/sugar on the morning of)  SCHEDULE "Lab Only" visit in the morning at the clinic for lab draw in 1 YEAR  - Make sure Lab Only appointment is at about 1 week before your next appointment, so that results will be available  For Lab Results, once available within 2-3 days of blood draw, you can can log in to MyChart online to view your results and a brief explanation. Also, we can discuss results at next follow-up visit.   Please schedule a Follow-up  Appointment to: No follow-ups on file.  If you have any other questions or concerns, please feel free to call the office or send a message through MyChart. You may also schedule an earlier appointment if necessary.  Additionally, you may be receiving a survey about your experience at our office within a few days to 1 week by e-mail or mail. We value your feedback.  Saralyn Pilar, DO [facility] [place]

## 2023-03-26 ENCOUNTER — Telehealth (HOSPITAL_BASED_OUTPATIENT_CLINIC_OR_DEPARTMENT_OTHER): Payer: BC Managed Care – PPO | Admitting: Pulmonary Disease

## 2023-03-26 DIAGNOSIS — G4733 Obstructive sleep apnea (adult) (pediatric): Secondary | ICD-10-CM

## 2023-03-26 NOTE — Telephone Encounter (Signed)
Split night study showed severe OSA with AHI 30/h corrected by CPAP 18 cm Please send RX for autoCPAP 12-1 8 cm Consult with sleep MD if desired

## 2023-03-27 NOTE — Addendum Note (Signed)
Addended by: Smitty Cords on: 03/27/2023 08:15 AM   Modules accepted: Orders

## 2023-03-27 NOTE — Telephone Encounter (Signed)
DME CPAP order for autoCPAP 12-18 cm has been ordered in Epic.  Does our [facility] have a preferred DME provider for this patient based on insurance? Where should I send his DME order?  Thank you!  Attached [facility] Clinical team to assist as well. May need to contact patient as well to find out if he has a preferred DME provider for CPAP orders.  Saralyn Pilar, DO [facility] 03/27/2023, 8:15 AM

## 2023-03-29 NOTE — Telephone Encounter (Signed)
Okay, I spoke with patient.  He said that the [facility] does not order CPAP equipment or supply this.  I have received the paperwork on his sleep study.  I have a printed signed order for DME CPAP order with comments included for:  Autotitration CPAP or AUTO CPAP  Pressure setting 12-18 cm range  He will call his insurance and try to find an approved preferred DME Supplier for [place] to fax the CPAP order to.  Otherwise, I gave him a local option  [facility] [address] [place], [place]  [address] Ph: [phone] Fax: [phone]  I am not sure if they are in his ins network.  He will call or message [place] back once he finds the location we need to fax the order to  Saralyn Pilar, DO [facility] 03/29/2023, 2:05 PM

## 2023-03-30 ENCOUNTER — Encounter: Payer: Self-pay | Admitting: Family Medicine

## 2023-03-30 DIAGNOSIS — R195 Other fecal abnormalities: Secondary | ICD-10-CM

## 2023-03-31 LAB — COLOGUARD: COLOGUARD: POSITIVE — AB

## 2023-04-02 ENCOUNTER — Other Ambulatory Visit: Payer: Self-pay | Admitting: Family Medicine

## 2023-04-03 NOTE — Addendum Note (Signed)
Addended by: Smitty Cords on: 04/03/2023 02:15 PM   Modules accepted: Orders

## 2023-04-05 ENCOUNTER — Other Ambulatory Visit: Payer: Self-pay

## 2023-04-05 ENCOUNTER — Telehealth: Payer: Self-pay

## 2023-04-05 DIAGNOSIS — Z1211 Encounter for screening for malignant neoplasm of colon: Secondary | ICD-10-CM

## 2023-04-05 DIAGNOSIS — R195 Other fecal abnormalities: Secondary | ICD-10-CM

## 2023-04-05 MED ORDER — NA SULFATE-K SULFATE-MG SULF 17.5-3.13-1.6 GM/177ML PO SOLN: 1.0000 | Freq: Once | ORAL | 0 refills | Status: AC

## 2023-04-05 NOTE — Telephone Encounter (Signed)
Gastroenterology Pre-Procedure Review  Request Date: 05/15/23 Requesting Physician: Dr. Servando Snare  PATIENT REVIEW QUESTIONS: The patient responded to the following health history questions as indicated:    1. Are you having any GI issues? Positive cologuard 2. Do you have a personal history of Polyps? no 3. Do you have a family history of Colon Cancer or Polyps? no 4. Diabetes Mellitus? no 5. Joint replacements in the past 12 months?no 6. Major health problems in the past 3 months?no 7. Any artificial heart valves, MVP, or defibrillator?no 8. Cardiac issues? Paroxysmal atrial fib clearance sent to cv preop team    MEDICATIONS & ALLERGIES:    Patient reports the following regarding taking any anticoagulation/antiplatelet therapy:   Plavix, Coumadin, Eliquis, Xarelto, Lovenox, Pradaxa, Brilinta, or Effient? yes (eliquis clearance sent to cv preop team) Aspirin? no  Patient confirms/reports the following medications:  Current Outpatient Medications  Medication Sig Dispense Refill   allopurinol (ZYLOPRIM) 300 MG tablet Take 1 tablet (300 mg total) by mouth daily. 30 tablet 11   apixaban (ELIQUIS) 5 MG TABS tablet TAKE 1 TABLET(5 MG) BY MOUTH TWICE DAILY 180 tablet 1   diltiazem (CARDIZEM CD) 120 MG 24 hr capsule TAKE 1 CAPSULE BY MOUTH DAILY 90 capsule 3   fluticasone (FLONASE) 50 MCG/ACT nasal spray Place 2 sprays into both nostrils daily. Use for 4-6 weeks then stop and use seasonally or as needed. 48 g 3   furosemide (LASIX) 20 MG tablet TAKE 1 TABLET(20 MG) BY MOUTH DAILY AS NEEDED 30 tablet 6   hydrochlorothiazide (HYDRODIURIL) 25 MG tablet TAKE 1 TABLET(25 MG) BY MOUTH DAILY 90 tablet 3   loratadine (CLARITIN) 10 MG tablet Take 10 mg by mouth daily.     losartan (COZAAR) 100 MG tablet Take 1 tablet (100 mg total) by mouth daily. 90 tablet 3   ranolazine (RANEXA) 500 MG 12 hr tablet TAKE 1 TABLET(500 MG) BY MOUTH TWICE DAILY 180 tablet 1   tamsulosin (FLOMAX) 0.4 MG CAPS capsule Take 0.4  mg by mouth daily. (Patient not taking: Reported on 03/20/2023)     No current facility-administered medications for this visit.    Patient confirms/reports the following allergies:  Allergies  Allergen Reactions   Shellfish Allergy Itching    No orders of the defined types were placed in this encounter.   AUTHORIZATION INFORMATION Primary Insurance: 1D#: Group #:  Secondary Insurance: 1D#: Group #:  SCHEDULE INFORMATION: Date: 05/15/23 Time: Location: [facility]

## 2023-04-18 ENCOUNTER — Ambulatory Visit: Payer: BLUE CROSS/BLUE SHIELD | Admitting: Urology

## 2023-04-23 ENCOUNTER — Telehealth: Payer: Self-pay | Admitting: *Deleted

## 2023-04-23 NOTE — Telephone Encounter (Signed)
Patient with diagnosis of afib on Eliquis for anticoagulation.    Procedure: colonoscopy Date of procedure: 05/15/23  CHA2DS2-VASc Score = 1  This indicates a 0.6% annual risk of stroke. The patient's score is based upon: CHF History: 0 HTN History: 1 Diabetes History: 0 Stroke History: 0 Vascular Disease History: 0 Age Score: 0 Gender Score: 0   Underwent afib ablation 09/04/22.  CrCl >19mL/min Platelet count 167K  Per office protocol, patient can hold Eliquis for 1-2 days prior to procedure.    **This guidance is not considered finalized until pre-operative APP has relayed final recommendations.**

## 2023-04-23 NOTE — Telephone Encounter (Signed)
   Pre-operative Risk Assessment    Patient Name: Jose Garcia  DOB: 29-Nov-1960 MRN: [id number]  DATE OF LAST VISIT: 12/06/22 DR. Lalla Brothers DATE OF NEXT VISIT: NONE    Request for Surgical Clearance    Procedure:   COLONOSCOPY  Date of Surgery:  Clearance 05/15/23                                 Surgeon:  DR. DARREN WOHL Surgeon's Group or Practice Name:  [facility] Phone number:  [phone] Fax number:  [phone]   Type of Clearance Requested:   - Medical  - Pharmacy:  Hold Apixaban (Eliquis)     Type of Anesthesia:  General    Additional requests/questions:    Elpidio Anis   04/23/2023, 4:11 PM

## 2023-04-24 ENCOUNTER — Telehealth: Payer: Self-pay | Admitting: *Deleted

## 2023-04-24 NOTE — Telephone Encounter (Signed)
Tele visit pre op appt 05/04/23 @ 2:40. Med rec and consent are done. Pt also needed to schedule his f/u for his 6 month f/u per Dr. Lalla Brothers 12/2022 ov note.

## 2023-04-24 NOTE — Telephone Encounter (Signed)
S/w pt today about tel pre op appt. Pt also tells me that he thinks he is supposed to f/u with Dr. Lalla Brothers sometime around Dec 2024. I reviewed the last ov notes from Dr. Lalla Brothers 12/2022 who does state the pt needs 6 month f/u. Pt has been scheduled today to see Dr. Lalla Brothers 06/20/23 @ 10:20. Pt thanked me for the help.

## 2023-04-24 NOTE — Telephone Encounter (Signed)
   Name: Jose Garcia  DOB: [birth year]  MRN: [id number]  Primary Cardiologist: None   Preoperative team, please contact this patient and set up a phone call appointment for further preoperative risk assessment. Please obtain consent and complete medication review. Thank you for your help.  I confirm that guidance regarding antiplatelet and oral anticoagulation therapy has been completed and, if necessary, noted below.  Per office protocol, patient can hold Eliquis for 1-2 days prior to procedure.   I also confirmed the patient resides in the state of [place]. As per [facility] Medical Board telemedicine laws, the patient must reside in the state in which the provider is licensed.   Ronney Asters, NP 04/24/2023, 8:31 AM [facility]

## 2023-04-24 NOTE — Telephone Encounter (Signed)
Tele visit pre op appt 05/04/23 @ 2:40. Med rec and consent are done. Pt also needed to schedule his f/u for his 6 month f/u per Dr. Lalla Brothers 12/2022 ov note.     Patient Consent for Virtual Visit        Jose Garcia has provided verbal consent on 04/24/2023 for a virtual visit (video or telephone).   CONSENT FOR VIRTUAL VISIT FOR:  Jose Garcia  By participating in this virtual visit I agree to the following:  I hereby voluntarily request, consent and authorize [facility] and its employed or contracted physicians, physician assistants, nurse practitioners or other licensed health care professionals (the Practitioner), to provide me with telemedicine health care services (the "Services") as deemed necessary by the treating Practitioner. I acknowledge and consent to receive the Services by the Practitioner via telemedicine. I understand that the telemedicine visit will involve communicating with the Practitioner through live audiovisual communication technology and the disclosure of certain medical information by electronic transmission. I acknowledge that I have been given the opportunity to request an in-person assessment or other available alternative prior to the telemedicine visit and am voluntarily participating in the telemedicine visit.  I understand that I have the right to withhold or withdraw my consent to the use of telemedicine in the course of my care at any time, without affecting my right to future care or treatment, and that the Practitioner or I may terminate the telemedicine visit at any time. I understand that I have the right to inspect all information obtained and/or recorded in the course of the telemedicine visit and may receive copies of available information for a reasonable fee.  I understand that some of the potential risks of receiving the Services via telemedicine include:  Delay or interruption in medical evaluation due to technological equipment failure or  disruption; Information transmitted may not be sufficient (e.g. poor resolution of images) to allow for appropriate medical decision making by the Practitioner; and/or  In rare instances, security protocols could fail, causing a breach of personal health information.  Furthermore, I acknowledge that it is my responsibility to provide information about my medical history, conditions and care that is complete and accurate to the best of my ability. I acknowledge that Practitioner's advice, recommendations, and/or decision may be based on factors not within their control, such as incomplete or inaccurate data provided by me or distortions of diagnostic images or specimens that may result from electronic transmissions. I understand that the practice of medicine is not an exact science and that Practitioner makes no warranties or guarantees regarding treatment outcomes. I acknowledge that a copy of this consent can be made available to me via my patient portal [facility] MyChart), or I can request a printed copy by calling the office of [facility].    I understand that my insurance will be billed for this visit.   I have read or had this consent read to me. I understand the contents of this consent, which adequately explains the benefits and risks of the Services being provided via telemedicine.  I have been provided ample opportunity to ask questions regarding this consent and the Services and have had my questions answered to my satisfaction. I give my informed consent for the services to be provided through the use of telemedicine in my medical care

## 2023-05-04 ENCOUNTER — Encounter: Payer: Self-pay | Admitting: Family Medicine

## 2023-05-04 ENCOUNTER — Ambulatory Visit: Payer: BC Managed Care – PPO | Attending: Cardiovascular Disease | Admitting: Student

## 2023-05-04 DIAGNOSIS — Z01810 Encounter for preprocedural cardiovascular examination: Secondary | ICD-10-CM | POA: Diagnosis not present

## 2023-05-04 DIAGNOSIS — G4733 Obstructive sleep apnea (adult) (pediatric): Secondary | ICD-10-CM

## 2023-05-04 NOTE — Progress Notes (Signed)
Virtual Visit via Telephone Note   Because of Jose Garcia's co-morbid illnesses, he is at least at moderate risk for complications without adequate follow up.  This format is felt to be most appropriate for this patient at this time.  The patient did not have access to video technology/had technical difficulties with video requiring transitioning to audio format only (telephone).  All issues noted in this document were discussed and addressed.  No physical exam could be performed with this format.  Please refer to the patient's chart for his consent to telehealth for [name].  Evaluation Performed:  Preoperative cardiovascular risk assessment _____________   Date:  05/04/2023   Patient ID:  Jose Garcia, DOB 1960-11-07, MRN [id number] Patient Location:  Home Provider location:   Office  Primary Care Provider:  Smitty Cords, DO Primary Cardiologist:  None  Chief Complaint / Patient Profile   62 y.o. y/o male with a h/o PAF s/p A-fib ablation on anticoagulation, hypertension, hyperlipidemia, OSA on CPAP who is pending colonoscopy by Dr. Servando Snare on 05/15/2023 and presents today for telephonic preoperative cardiovascular risk assessment.  History of Present Illness    Jose Garcia is a 62 y.o. male who presents via audio/video conferencing for a telehealth visit today.  Pt was last seen in cardiology clinic on 12/06/2022 by Dr. Lalla Brothers.  At that time Jose Garcia was stable from a cardiac standpoint.  The patient is now pending procedure as outlined above. Since his last visit, he is doing well. Patient denies shortness of breath, dyspnea on exertion, lower extremity edema, orthopnea or PND. No chest pain, pressure, or tightness. No palpitations.  He does not follow a routine exercise program. He does a lot of walking for his full time work. He also performs yard work and other household activities.   Past Medical History    Past Medical History:  Diagnosis Date    Atrial fibrillation (HCC)    Cardiomyopathy    Resolved 05/03/09   Hyperlipidemia    Hypertension 03/03/1996   Kidney stone    Sleep apnea    Past Surgical History:  Procedure Laterality Date   ATRIAL FIBRILLATION ABLATION N/A 09/04/2022   Procedure: ATRIAL FIBRILLATION ABLATION;  Surgeon: Lanier Prude, MD;  [facility];  Service: Cardiovascular;  Laterality: N/A;   CARDIOVERSION      Allergies  Allergies  Allergen Reactions   Shellfish Allergy Itching    Home Medications    Prior to Admission medications   Medication Sig Start Date End Date Taking? Authorizing Provider  allopurinol (ZYLOPRIM) 300 MG tablet Take 1 tablet (300 mg total) by mouth daily. 03/07/23   Sondra Come, MD  apixaban (ELIQUIS) 5 MG TABS tablet TAKE 1 TABLET(5 MG) BY MOUTH TWICE DAILY 01/25/23   Lanier Prude, MD  diltiazem (CARDIZEM CD) 120 MG 24 hr capsule TAKE 1 CAPSULE BY MOUTH DAILY 07/11/22   Duke Salvia, MD  fluticasone [facility]) 50 MCG/ACT nasal spray Place 2 sprays into both nostrils daily. Use for 4-6 weeks then stop and use seasonally or as needed. 10/08/19   Karamalegos, Netta Neat, DO  furosemide (LASIX) 20 MG tablet TAKE 1 TABLET(20 MG) BY MOUTH DAILY AS NEEDED 01/02/23   Duke Salvia, MD  hydrochlorothiazide (HYDRODIURIL) 25 MG tablet TAKE 1 TABLET(25 MG) BY MOUTH DAILY 05/04/22   Duke Salvia, MD  loratadine (CLARITIN) 10 MG tablet Take 10 mg by mouth daily.    [provider]  losartan (COZAAR) 100 MG tablet Take 1 tablet (100 mg total) by mouth daily. 05/04/22   Duke Salvia, MD  ranolazine (RANEXA) 500 MG 12 hr tablet TAKE 1 TABLET(500 MG) BY MOUTH TWICE DAILY 02/26/23   Lanier Prude, MD  tamsulosin (FLOMAX) 0.4 MG CAPS capsule Take 0.4 mg by mouth daily. Patient not taking: Reported on 03/20/2023 01/14/23   [provider]    Physical Exam    Vital Signs:  Jose Garcia does not have vital signs available for review  today.  Given telephonic nature of communication, physical exam is limited. AAOx3. NAD. Normal affect.  Speech and respirations are unlabored.  Accessory Clinical Findings    None  Assessment & Plan    Primary Cardiologist: None  Preoperative cardiovascular risk assessment.  Colonoscopy by Dr. Servando Snare on 05/15/2023.  Chart reviewed as part of pre-operative protocol coverage. According to the RCRI, patient has a 0.4% risk of MACE. Patient reports activity equivalent to >4.0 METS (walks a lot at work, performs yard work).   Given past medical history and time since last visit, based on ACC/AHA guidelines, Jose Garcia would be at acceptable risk for the planned procedure without further cardiovascular testing.   Patient was advised that if he develops new symptoms prior to surgery to contact our office to arrange a follow-up appointment.  he verbalized understanding.  Per Pharm D, patient may hold Eliquis for 1-2 days prior to procedure.    I will route this recommendation to the requesting party via Epic fax function.  Please call with questions.  Time:   Today, I have spent 5 minutes with the patient with telehealth technology discussing medical history, symptoms, and management plan.     Carlos Levering, NP  05/04/2023, 8:29 AM

## 2023-05-07 ENCOUNTER — Telehealth: Payer: Self-pay

## 2023-05-07 NOTE — Telephone Encounter (Signed)
Patient has been advised to stop Eliquis on Sunday 2 days before his colonoscopy.  Pt verbalized understanding.  Thanks, [place], [place]

## 2023-05-08 ENCOUNTER — Encounter: Payer: Self-pay | Admitting: Gastroenterology

## 2023-05-10 ENCOUNTER — Other Ambulatory Visit: Payer: Self-pay | Admitting: Cardiology

## 2023-05-10 DIAGNOSIS — I480 Paroxysmal atrial fibrillation: Secondary | ICD-10-CM

## 2023-05-10 MED ORDER — APIXABAN 5 MG PO TABS
5.0000 mg | ORAL_TABLET | Freq: Two times a day (BID) | ORAL | 1 refills | Status: DC
Start: 2023-05-10 — End: 2023-06-20

## 2023-05-10 NOTE — Telephone Encounter (Signed)
*  STAT* If patient is at the pharmacy, call can be transferred to refill team.   1. Which medications need to be refilled? (please list name of each medication and dose if known) apixaban (ELIQUIS) 5 MG TABS tablet  2. Which pharmacy/location (including street and city if local pharmacy) is medication to be sent to? [id number] - [place], [place] - [address] AT [facility] [address]  3. Do they need a 30 day or 90 day supply?   90 day supply

## 2023-05-10 NOTE — Telephone Encounter (Signed)
Prescription refill request for Eliquis received. Indication: Afib  Last office visit: 05/04/23 ([place])  Scr: 0.89 (03/16/23)  Age: 61 Weight: 125.2kg  Appropriate dose. Refill sent.

## 2023-05-14 ENCOUNTER — Encounter: Payer: Self-pay | Admitting: Gastroenterology

## 2023-05-15 ENCOUNTER — Encounter
Admission: RE | Disposition: A | Discharge status: Home / Self Care | Payer: Self-pay | Source: Home / Self Care | Attending: Gastroenterology

## 2023-05-15 ENCOUNTER — Ambulatory Visit
Admission: RE | Admit: 2023-05-15 | Discharge: 2023-05-15 | Disposition: A | Discharge status: Home / Self Care | Payer: BC Managed Care – PPO | Attending: Gastroenterology | Admitting: Gastroenterology

## 2023-05-15 ENCOUNTER — Encounter: Payer: Self-pay | Admitting: Gastroenterology

## 2023-05-15 ENCOUNTER — Ambulatory Visit: Payer: BC Managed Care – PPO | Admitting: Certified Registered"

## 2023-05-15 ENCOUNTER — Other Ambulatory Visit: Payer: Self-pay

## 2023-05-15 DIAGNOSIS — K635 Polyp of colon: Secondary | ICD-10-CM

## 2023-05-15 DIAGNOSIS — K641 Second degree hemorrhoids: Secondary | ICD-10-CM | POA: Insufficient documentation

## 2023-05-15 DIAGNOSIS — D125 Benign neoplasm of sigmoid colon: Secondary | ICD-10-CM | POA: Diagnosis not present

## 2023-05-15 DIAGNOSIS — R195 Other fecal abnormalities: Secondary | ICD-10-CM | POA: Insufficient documentation

## 2023-05-15 DIAGNOSIS — I10 Essential (primary) hypertension: Secondary | ICD-10-CM | POA: Diagnosis not present

## 2023-05-15 DIAGNOSIS — D123 Benign neoplasm of transverse colon: Secondary | ICD-10-CM | POA: Insufficient documentation

## 2023-05-15 DIAGNOSIS — G4730 Sleep apnea, unspecified: Secondary | ICD-10-CM | POA: Diagnosis not present

## 2023-05-15 DIAGNOSIS — Z1211 Encounter for screening for malignant neoplasm of colon: Secondary | ICD-10-CM | POA: Insufficient documentation

## 2023-05-15 DIAGNOSIS — I4891 Unspecified atrial fibrillation: Secondary | ICD-10-CM | POA: Diagnosis not present

## 2023-05-15 DIAGNOSIS — D124 Benign neoplasm of descending colon: Secondary | ICD-10-CM | POA: Insufficient documentation

## 2023-05-15 HISTORY — PX: COLONOSCOPY WITH PROPOFOL: SHX5780

## 2023-05-15 HISTORY — PX: POLYPECTOMY: SHX5525

## 2023-05-15 SURGERY — COLONOSCOPY WITH PROPOFOL
Anesthesia: General

## 2023-05-15 MED ORDER — PROPOFOL 500 MG/50ML IV EMUL
INTRAVENOUS | Status: DC | PRN
  Administered 2023-05-15: 150 ug/kg/min via INTRAVENOUS

## 2023-05-15 MED ORDER — PROPOFOL 10 MG/ML IV BOLUS
INTRAVENOUS | Status: DC | PRN
  Administered 2023-05-15: 20 mg via INTRAVENOUS
  Administered 2023-05-15: 80 mg via INTRAVENOUS

## 2023-05-15 MED ORDER — LIDOCAINE HCL (CARDIAC) PF 100 MG/5ML IV SOSY
PREFILLED_SYRINGE | INTRAVENOUS | Status: DC | PRN
  Administered 2023-05-15: 30 mg via INTRAVENOUS

## 2023-05-15 MED ORDER — SODIUM CHLORIDE 0.9 % IV SOLN: INTRAVENOUS | Status: DC

## 2023-05-15 NOTE — Anesthesia Postprocedure Evaluation (Signed)
Anesthesia Post Note  Patient: HUCKLEBERRY WICHERN  Procedure(s) Performed: COLONOSCOPY WITH PROPOFOL POLYPECTOMY  Patient location during evaluation: Endoscopy Anesthesia Type: General Level of consciousness: awake and alert Pain management: pain level controlled Vital Signs Assessment: post-procedure vital signs reviewed and stable Respiratory status: spontaneous breathing, nonlabored ventilation, respiratory function stable and patient connected to nasal cannula oxygen Cardiovascular status: blood pressure returned to baseline and stable Postop Assessment: no apparent nausea or vomiting Anesthetic complications: no   No notable events documented.   Last Vitals:  Vitals:   05/15/23 1045 05/15/23 1053  BP: 134/79 128/87  Pulse: 72 65  Resp: 16 16  Temp:    SpO2:      Last Pain:  Vitals:   05/15/23 1053  TempSrc:   PainSc: 0-No pain                 Louie Boston

## 2023-05-15 NOTE — H&P (Signed)
Jose Minium, MD [facility] [address] [place], [place] [address] Phone:[phone] Fax : [phone]  Primary Care Physician:  Smitty Cords, DO Primary Gastroenterologist:  Dr. Servando Snare  Pre-Procedure History & Physical: HPI:  Jose Garcia is a 62 y.o. male is here for an colonoscopy.   Past Medical History:  Diagnosis Date   Atrial fibrillation (HCC)    Cardiomyopathy    Resolved 05/03/09   Hyperlipidemia    Hypertension 03/03/1996   Kidney stone    Sleep apnea     Past Surgical History:  Procedure Laterality Date   ATRIAL FIBRILLATION ABLATION N/A 09/04/2022   Procedure: ATRIAL FIBRILLATION ABLATION;  Surgeon: Lanier Prude, MD;  Location: [facility];  Service: Cardiovascular;  Laterality: N/A;   CARDIOVERSION     COLONOSCOPY      Prior to Admission medications   Medication Sig Start Date End Date Taking? Authorizing Provider  allopurinol (ZYLOPRIM) 300 MG tablet Take 1 tablet (300 mg total) by mouth daily. 03/07/23  Yes Sondra Come, MD  diltiazem (CARDIZEM CD) 120 MG 24 hr capsule TAKE 1 CAPSULE BY MOUTH DAILY 07/11/22  Yes Duke Salvia, MD  fluticasone [facility]) 50 MCG/ACT nasal spray Place 2 sprays into both nostrils daily. Use for 4-6 weeks then stop and use seasonally or as needed. 10/08/19  Yes Karamalegos, Netta Neat, DO  furosemide (LASIX) 20 MG tablet TAKE 1 TABLET(20 MG) BY MOUTH DAILY AS NEEDED 01/02/23  Yes Duke Salvia, MD  hydrochlorothiazide (HYDRODIURIL) 25 MG tablet TAKE 1 TABLET(25 MG) BY MOUTH DAILY 05/04/22  Yes Duke Salvia, MD  loratadine (CLARITIN) 10 MG tablet Take 10 mg by mouth daily.   Yes [provider]  losartan (COZAAR) 100 MG tablet Take 1 tablet (100 mg total) by mouth daily. 05/04/22  Yes Duke Salvia, MD  ranolazine (RANEXA) 500 MG 12 hr tablet TAKE 1 TABLET(500 MG) BY MOUTH TWICE DAILY 02/26/23  Yes Lanier Prude, MD  apixaban (ELIQUIS) 5 MG TABS tablet Take 1 tablet (5 mg total) by  mouth 2 (two) times daily. 05/10/23   Lanier Prude, MD  tamsulosin (FLOMAX) 0.4 MG CAPS capsule Take 0.4 mg by mouth daily. Patient not taking: Reported on 03/20/2023 01/14/23   [provider]    Allergies as of 04/06/2023 - Review Complete 04/05/2023  Allergen Reaction Noted   Shellfish allergy Itching 09/02/2013    Family History  Problem Relation Age of Onset   Hypertension Father    Hypertension Mother    Heart disease Mother    Cancer Mother    Diabetes Neg Hx    Coronary artery disease Neg Hx    Colon cancer Neg Hx    Prostate cancer Neg Hx     Social History   Socioeconomic History   Marital status: Married    Spouse name: Not on file   Number of children: 2   Years of education: Not on file   Highest education level: Not on file  Occupational History   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   Social Determinants of Health   Financial Resource Strain: Not on file  Food Insecurity: Not on file  Transportation Needs: Not on file  Physical Activity: Not on file  Stress: Not on file  Social Connections: Not on file  Intimate Partner Violence: Not on file    Review of Systems: See HPI, otherwise negative ROS  Physical Exam: There were no vitals taken for this visit. General:   Alert,  pleasant and cooperative in NAD Head:  Normocephalic and atraumatic. Neck:  Supple; no masses or thyromegaly. Lungs:  Clear throughout to  auscultation.    Heart:  Regular rate and rhythm. Abdomen:  Soft, nontender and nondistended. Normal bowel sounds, without guarding, and without rebound.   Neurologic:  Alert and  oriented x4;  grossly normal neurologically.  Impression/Plan: Jose Garcia is here for an colonoscopy to be performed for positive cologard  Risks, benefits, limitations, and alternatives regarding  colonoscopy have been reviewed with the patient.  Questions have been answered.  All parties agreeable.   Jose Minium, MD  05/15/2023, 9:51 AM

## 2023-05-15 NOTE — Op Note (Signed)
[facility] Gastroenterology Patient Name: Jose Garcia Procedure Date: 05/15/2023 9:52 AM MRN: [id number] Account #: [id number] Date of Birth: 03-06-1961 Admit Type: Outpatient Age: 62 Room: [facility] ENDO ROOM 4 Gender: Male Note Status: Finalized Instrument Name: Prentice Docker [id number] Procedure:             Colonoscopy Indications:           Positive Cologuard test Providers:             Midge Minium MD, MD Referring MD:          Smitty Cords (Referring MD) Medicines:             Propofol per Anesthesia Complications:         No immediate complications. Procedure:             Pre-Anesthesia Assessment:                        - Prior to the procedure, a History and Physical was                         performed, and patient medications and allergies were                         reviewed. The patient's tolerance of previous                         anesthesia was also reviewed. The risks and benefits                         of the procedure and the sedation options and risks                         were discussed with the patient. All questions were                         answered, and informed consent was obtained. Prior                         Anticoagulants: The patient has taken no anticoagulant                         or antiplatelet agents. ASA Grade Assessment: II - A                         patient with mild systemic disease. After reviewing                         the risks and benefits, the patient was deemed in                         satisfactory condition to undergo the procedure.                        After obtaining informed consent, the colonoscope was                         passed under direct vision. Throughout the procedure,  the patient's blood pressure, pulse, and oxygen                         saturations were monitored continuously. The                         Colonoscope was introduced through the anus and                          advanced to the the cecum, identified by appendiceal                         orifice and ileocecal valve. The colonoscopy was                         performed without difficulty. The patient tolerated                         the procedure well. The quality of the bowel                         preparation was excellent. Findings:      The perianal and digital rectal examinations were normal.      Four sessile polyps were found in the transverse colon. The polyps were       3 to 6 mm in size. These polyps were removed with a cold snare.       Resection and retrieval were complete.      Two sessile polyps were found in the descending colon. The polyps were 5       to 6 mm in size. These polyps were removed with a cold snare. Resection       and retrieval were complete.      A 4 mm polyp was found in the sigmoid colon. The polyp was sessile. The       polyp was removed with a cold snare. Resection and retrieval were       complete.      Non-bleeding internal hemorrhoids were found during retroflexion. The       hemorrhoids were Grade II (internal hemorrhoids that prolapse but reduce       spontaneously). Impression:            - Four 3 to 6 mm polyps in the transverse colon,                         removed with a cold snare. Resected and retrieved.                        - Two 5 to 6 mm polyps in the descending colon,                         removed with a cold snare. Resected and retrieved.                        - One 4 mm polyp in the sigmoid colon, removed with a                         cold snare. Resected and retrieved.                        -  Non-bleeding internal hemorrhoids. Recommendation:        - Discharge patient to home.                        - Resume previous diet.                        - Continue present medications.                        - Await pathology results.                        - If the pathology report reveals adenomatous tissue,                          then repeat the colonoscopy for surveillance in 3                         years. Procedure Code(s):     --- Professional ---                        [phone], Colonoscopy, flexible; with removal of                         tumor(s), polyp(s), or other lesion(s) by snare                         technique Diagnosis Code(s):     --- Professional ---                        R19.5, Other fecal abnormalities                        D12.5, Benign neoplasm of sigmoid colon CPT copyright 2022 American Medical Association. All rights reserved. The codes documented in this report are preliminary and upon coder review may  be revised to meet current compliance requirements. Midge Minium MD, MD 05/15/2023 10:33:13 AM This report has been signed electronically. Number of Addenda: 0 Note Initiated On: 05/15/2023 9:52 AM Scope Withdrawal Time: 0 hours 13 minutes 36 seconds  Total Procedure Duration: 0 hours 21 minutes 53 seconds  Estimated Blood Loss:  Estimated blood loss: none.      [facility]

## 2023-05-15 NOTE — Anesthesia Preprocedure Evaluation (Signed)
Anesthesia Evaluation  Patient identified by MRN, date of birth, ID band Patient awake    Reviewed: Allergy & Precautions, NPO status , Patient's Chart, lab work & pertinent test results  History of Anesthesia Complications Negative for: history of anesthetic complications  Airway Mallampati: II  TM Distance: >3 FB Neck ROM: full    Dental no notable dental hx.    Pulmonary sleep apnea    Pulmonary exam normal        Cardiovascular hypertension, On Medications + dysrhythmias Atrial Fibrillation   Echo 07/2022  IMPRESSIONS     1. Left ventricular ejection fraction, by estimation, is 60 to 65%. The  left ventricle has normal function. The left ventricle has no regional  wall motion abnormalities. Left ventricular diastolic parameters are  consistent with Grade I diastolic  dysfunction (impaired relaxation). The average left ventricular global  longitudinal strain is -24.7 %. The global longitudinal strain is normal.   2. Right ventricular systolic function is normal. The right ventricular  size is normal.   3. Left atrial size was mildly dilated.   4. The mitral valve is normal in structure. No evidence of mitral valve  regurgitation. No evidence of mitral stenosis.   5. The aortic valve has an indeterminant number of cusps. Aortic valve  regurgitation is not visualized. No aortic stenosis is present.   6. There is mild dilatation of the ascending aorta, measuring 40 mm.   7. The inferior vena cava is normal in size with greater than 50%  respiratory variability, suggesting right atrial pressure of 3 mmHg.      Neuro/Psych negative neurological ROS  negative psych ROS   GI/Hepatic negative GI ROS, Neg liver ROS,,,  Endo/Other  negative endocrine ROS    Renal/GU negative Renal ROS  negative genitourinary   Musculoskeletal   Abdominal   Peds  Hematology negative hematology ROS (+)   Anesthesia Other  Findings Past Medical History: No date: Atrial fibrillation (HCC) No date: Cardiomyopathy     Comment:  Resolved 05/03/09 No date: Hyperlipidemia 03/03/1996: Hypertension No date: Kidney stone No date: Sleep apnea  Past Surgical History: 09/04/2022: ATRIAL FIBRILLATION ABLATION; N/A     Comment:  Procedure: ATRIAL FIBRILLATION ABLATION;  Surgeon:               Lanier Prude, MD;  Location: [facility] INVASIVE CV LAB;                Service: Cardiovascular;  Laterality: N/A; No date: CARDIOVERSION No date: COLONOSCOPY     Reproductive/Obstetrics negative OB ROS                             Anesthesia Physical Anesthesia Plan  ASA: 3  Anesthesia Plan: General   Post-op Pain Management: Minimal or no pain anticipated   Induction: Intravenous  PONV Risk Score and Plan: 1 and Propofol infusion and TIVA  Airway Management Planned: Natural Airway and Nasal Cannula  Additional Equipment:   Intra-op Plan:   Post-operative Plan:   Informed Consent: I have reviewed the patients History and Physical, chart, labs and discussed the procedure including the risks, benefits and alternatives for the proposed anesthesia with the patient or authorized representative who has indicated his/her understanding and acceptance.     Dental Advisory Given  Plan Discussed with: Anesthesiologist, CRNA and Surgeon  Anesthesia Plan Comments: (Patient consented for risks of anesthesia including but not limited to:  - adverse reactions  to medications - risk of airway placement if required - damage to eyes, teeth, lips or other oral mucosa - nerve damage due to positioning  - sore throat or hoarseness - Damage to heart, brain, nerves, lungs, other parts of body or loss of life  Patient voiced understanding and assent.)        Anesthesia Quick Evaluation

## 2023-05-15 NOTE — Transfer of Care (Signed)
Immediate Anesthesia Transfer of Care Note  Patient: Jose Garcia  Procedure(s) Performed: COLONOSCOPY WITH PROPOFOL POLYPECTOMY  Patient Location: Endoscopy Unit  Anesthesia Type:General  Level of Consciousness: drowsy and patient cooperative  Airway & Oxygen Therapy: Patient Spontanous Breathing  Post-op Assessment: Report given to RN, Post -op Vital signs reviewed and stable, and Patient moving all extremities X 4  Post vital signs: Reviewed and stable  Last Vitals:  Vitals Value Taken Time  BP 133/80 05/15/23 1035  Temp 36.2 C 05/15/23 1035  Pulse 77 05/15/23 1035  Resp 20 05/15/23 1035  SpO2 96 % 05/15/23 1035    Last Pain:  Vitals:   05/15/23 1035  TempSrc: Temporal  PainSc: 0-No pain         Complications: No notable events documented.

## 2023-05-16 ENCOUNTER — Encounter: Payer: Self-pay | Admitting: Gastroenterology

## 2023-05-16 LAB — SURGICAL PATHOLOGY

## 2023-05-17 ENCOUNTER — Encounter: Payer: Self-pay | Admitting: Gastroenterology

## 2023-05-21 NOTE — Addendum Note (Signed)
Addended by: Smitty Cords on: 05/21/2023 07:21 PM   Modules accepted: Orders

## 2023-05-29 ENCOUNTER — Telehealth: Payer: Self-pay | Admitting: Cardiology

## 2023-05-29 MED ORDER — LOSARTAN POTASSIUM 100 MG PO TABS: 100.0000 mg | ORAL_TABLET | Freq: Every day | ORAL | 1 refills | Status: DC

## 2023-05-29 MED ORDER — HYDROCHLOROTHIAZIDE 25 MG PO TABS: ORAL_TABLET | ORAL | 1 refills | Status: DC

## 2023-05-29 NOTE — Telephone Encounter (Signed)
Sounds like this is resolved. Our office has not been managing CPAP supplies online through the manufacturer website, since we have very limited number of patients that we actively manage their equipment.  As far as re-printing the order. It is listed under the mychart encounter as a DME Engineer, drilling) - in future can possibly re print the order requisition same way as reprinting a lab order. Or can just re order it and it will reprint. But we should not need to do that anymore if patient is telling us it is resolved.  Saralyn Pilar, DO [facility] 05/29/2023, 8:04 AM

## 2023-05-29 NOTE — Telephone Encounter (Signed)
*  STAT* If patient is at the pharmacy, call can be transferred to refill team.   1. Which medications need to be refilled? (please list name of each medication and dose if known) losartan (COZAAR) 100 MG tablet    hydrochlorothiazide (HYDRODIURIL) 25 MG tablet   2. Which pharmacy/location (including street and city if local pharmacy) is medication to be sent to?[id number] - [place], [place] - [address] AT [facility] [address]   3. Do they need a 30 day or 90 day supply? 90 day

## 2023-05-29 NOTE — Telephone Encounter (Signed)
Requested Prescriptions   Signed Prescriptions Disp Refills   losartan (COZAAR) 100 MG tablet 90 tablet 1    Sig: Take 1 tablet (100 mg total) by mouth daily.    Authorizing Provider: Lanier Prude    Ordering User: Guerry Minors   hydrochlorothiazide (HYDRODIURIL) 25 MG tablet 90 tablet 1    Sig: TAKE 1 TABLET(25 MG) BY MOUTH DAILY    Authorizing Provider: Lanier Prude    Ordering User: Feliberto Harts L    Last visit 12/06/22 with plan to f/u in 6 months.    Next visit: 06/20/23

## 2023-06-08 ENCOUNTER — Telehealth: Payer: Self-pay | Admitting: Cardiology

## 2023-06-08 MED ORDER — RANOLAZINE ER 500 MG PO TB12: 500.0000 mg | ORAL_TABLET | Freq: Two times a day (BID) | ORAL | 2 refills | Status: DC

## 2023-06-08 NOTE — Telephone Encounter (Signed)
*  STAT* If patient is at the pharmacy, call can be transferred to refill team.   1. Which medications need to be refilled? (please list name of each medication and dose if known)  ranolazine (RANEXA) 500 MG 12 hr tablet  2. Which pharmacy/location (including street and city if local pharmacy) is medication to be sent to? [id number] [place], [place] - [address] AT [facility] [address] [facility] Phone: [phone]  Fax: [phone]     3. Do they need a 30 day or 90 day supply? 90

## 2023-06-20 ENCOUNTER — Ambulatory Visit: Payer: BC Managed Care – PPO | Attending: Cardiology | Admitting: Cardiology

## 2023-06-20 ENCOUNTER — Ambulatory Visit (INDEPENDENT_AMBULATORY_CARE_PROVIDER_SITE_OTHER): Payer: BC Managed Care – PPO | Admitting: Family Medicine

## 2023-06-20 ENCOUNTER — Encounter: Payer: Self-pay | Admitting: Cardiology

## 2023-06-20 ENCOUNTER — Encounter: Payer: Self-pay | Admitting: Family Medicine

## 2023-06-20 VITALS — BP 146/76 | HR 59 | Ht 72.0 in | Wt 272.0 lb

## 2023-06-20 VITALS — BP 132/68 | HR 65 | Ht 72.0 in | Wt 273.0 lb

## 2023-06-20 DIAGNOSIS — G4733 Obstructive sleep apnea (adult) (pediatric): Secondary | ICD-10-CM | POA: Diagnosis not present

## 2023-06-20 DIAGNOSIS — I10 Essential (primary) hypertension: Secondary | ICD-10-CM | POA: Diagnosis not present

## 2023-06-20 DIAGNOSIS — Z79899 Other long term (current) drug therapy: Secondary | ICD-10-CM

## 2023-06-20 DIAGNOSIS — I4819 Other persistent atrial fibrillation: Secondary | ICD-10-CM | POA: Diagnosis not present

## 2023-06-20 MED ORDER — FUROSEMIDE 20 MG PO TABS: 20.0000 mg | ORAL_TABLET | ORAL | 6 refills | Status: DC | PRN

## 2023-06-20 MED ORDER — LOSARTAN POTASSIUM 100 MG PO TABS: 100.0000 mg | ORAL_TABLET | Freq: Every day | ORAL | 3 refills | Status: AC

## 2023-06-20 MED ORDER — DILTIAZEM HCL ER COATED BEADS 120 MG PO CP24: ORAL_CAPSULE | ORAL | 3 refills | Status: DC

## 2023-06-20 MED ORDER — ASPIRIN 81 MG PO TBEC: 81.0000 mg | DELAYED_RELEASE_TABLET | Freq: Every day | ORAL | Status: AC

## 2023-06-20 NOTE — Progress Notes (Signed)
  Electrophysiology Office Follow up Visit Note:    Date:  06/20/2023   ID:  Bari Mantis, DOB [birth year], MRN [id number]  PCP:  Smitty Cords, DO  [facility] Cardiologist:  None  [facility] Electrophysiologist:  Lanier Prude, MD    Interval History:     BLEASE ZEMAITIS is a 62 y.o. male who presents for a follow up visit.   I last saw the patient in December 06, 2022 for persistent atrial fibrillation.  He had a prior ablation March 4 during which the pulmonary veins and posterior wall were isolated.  At the last appointment, I stopped his flecainide and we continued his diltiazem and Eliquis.  We plan to discuss continuing anticoagulation if he were to not have a recurrence of atrial fibrillation.  He is doing well today.  No recurrent episodes of atrial fibrillation.  He is interested in stopping his Ranexa and Eliquis if possible.      Past medical, surgical, social and family history were reviewed.  ROS:   Please see the history of present illness.    All other systems reviewed and are negative.  EKGs/Labs/Other Studies Reviewed:    The following studies were reviewed today:     EKG Interpretation Date/Time:  Wednesday June 20 2023 10:16:42 EST Ventricular Rate:  59 PR Interval:  162 QRS Duration:  94 QT Interval:  442 QTC Calculation: 437 R Axis:   0  Text Interpretation: Sinus bradycardia with Premature atrial complexes Confirmed by Steffanie Dunn [phone]) on 06/20/2023 10:36:57 AM    Physical Exam:    VS:  BP (!) 146/76 (BP Location: Left Arm, Patient Position: Sitting, Cuff Size: Large)   Pulse (!) 59   Ht 6' (1.829 m)   Wt 272 lb (123.4 kg)   SpO2 97%   BMI 36.89 kg/m     Wt Readings from Last 3 Encounters:  06/20/23 272 lb (123.4 kg)  05/15/23 270 lb (122.5 kg)  03/20/23 276 lb (125.2 kg)     GEN: no distress CARD: RRR, No MRG RESP: No IWOB. CTAB.      ASSESSMENT:    1. Primary hypertension   2. Persistent  atrial fibrillation (HCC)   3. Encounter for long-term (current) use of high-risk medication    PLAN:    In order of problems listed above:  #Persistent atrial fibrillation #High risk med monitoring-Ranexa No interval atrial fibrillation since his last appointment.  We discussed ongoing use of Eliquis.  We discussed the pros and cons of continuing the medication versus stopping it.  He has a CHA2DS2-VASc of 1 for hypertension.  At this point, we have mutually decided to stop Eliquis and start aspirin 81 mg by mouth once daily.  He should employ some form of heart rhythm monitoring (wearable versus [employer]).  He can also stop Ranexa.  #Hypertension above goal today.  Recommend checking blood pressures 1-2 times per week at home and recording the values.  Recommend bringing these recordings to the primary care physician.  Okay to refill medications today.  Follow-up 1 year with APP .  Signed, Steffanie Dunn, MD, [facility], [facility] 06/20/2023 10:46 AM    Electrophysiology [facility]

## 2023-06-20 NOTE — Patient Instructions (Addendum)
Thank you for coming to the office today.  We will document the CPAP adherence.  Keep up the good work! Let me know if questions or concerns.  Please schedule a Follow-up Appointment to: Return if symptoms worsen or fail to improve.  If you have any other questions or concerns, please feel free to call the office or send a message through MyChart. You may also schedule an earlier appointment if necessary.  Additionally, you may be receiving a survey about your experience at our office within a few days to 1 week by e-mail or mail. We value your feedback.  Saralyn Pilar, DO [facility], [place]

## 2023-06-20 NOTE — Progress Notes (Signed)
Subjective:    Patient ID: Jose Garcia, male    DOB: [birth year], 62 y.o.   MRN: [id number]  Jose Garcia is a 62 y.o. male presenting on 06/20/2023 for Medical Management of Chronic Issues (Follow up for CPAP)   HPI  Discussed the use of AI scribe software for clinical note transcription with the patient, who gave verbal consent to proceed.  OSA on CPAP  The patient, a long-term user of CPAP for sleep apnea, presented for a routine check-in and attestation of CPAP use, now following recent updated sleep study and new CPAP equipment. He is here to review usage at follow-up interval.  He reported a high adherence to CPAP use, stating he uses it every night (100% reported usage) for an average of 8 to 9 hours per night. The patient noted an improvement in his condition after a recent adjustment to his CPAP machine. The pressure setting was adjusted from 8 to 12, with an average use around 9 cm H20. The patient felt that the previous pressure setting was too low and that the adjustment has led to better sleep quality. He tolerates machine well now. Improved daytime sleepiness now. Improved energy. He reported no issues with the current setup and expressed satisfaction with the treatment.        06/20/2023    2:40 PM 03/20/2023    1:19 PM 02/15/2023   10:32 AM  Depression screen PHQ 2/9  Decreased Interest 0 0 0  Down, Depressed, Hopeless 0 0 0  PHQ - 2 Score 0 0 0  Altered sleeping  0 0  Tired, decreased energy  0 0  Change in appetite  0 0  Feeling bad or failure about yourself   0 0  Trouble concentrating  0 0  Moving slowly or fidgety/restless  0 0  Suicidal thoughts  0 0  PHQ-9 Score  0 0  Difficult doing work/chores  Not difficult at all Not difficult at all       06/20/2023    2:40 PM 03/20/2023    1:19 PM 02/15/2023   10:32 AM 11/09/2021    8:32 AM  GAD 7 : Generalized Anxiety Score  Nervous, Anxious, on Edge 0 0 0 0  Control/stop worrying 0 0 0 0  Worry too much -  different things 0 0 0 0  Trouble relaxing 0 0 0 0  Restless 0 0 0 0  Easily annoyed or irritable 0 0 0 0  Afraid - awful might happen 0 0 0 0  Total GAD 7 Score 0 0 0 0  Anxiety Difficulty  Not difficult at all Not difficult at all Not difficult at all    Social History   Tobacco Use   Smoking status: Never    Passive exposure: Never   Smokeless tobacco: Never   Tobacco comments:    Never smoke 10/02/22  Vaping Use   Vaping status: Never Used  Substance Use Topics   Alcohol use: Yes    Alcohol/week: 4.0 - 5.0 standard drinks of alcohol    Types: 4 - 5 Standard drinks or equivalent per week    Comment: 4-5 mixed drinks weekly 10/02/22   Drug use: No    Review of Systems Per HPI unless specifically indicated above     Objective:    BP 132/68   Pulse 65   Ht 6' (1.829 m)   Wt 273 lb (123.8 kg)   SpO2 98%   BMI 37.03 kg/m  Wt Readings from Last 3 Encounters:  06/20/23 273 lb (123.8 kg)  06/20/23 272 lb (123.4 kg)  05/15/23 270 lb (122.5 kg)    Physical Exam Vitals and nursing note reviewed.  Constitutional:      General: He is not in acute distress.    Appearance: Normal appearance. He is well-developed. He is not diaphoretic.     Comments: Well-appearing, comfortable, cooperative  HENT:     Head: Normocephalic and atraumatic.  Eyes:     General:        Right eye: No discharge.        Left eye: No discharge.     Conjunctiva/sclera: Conjunctivae normal.  Cardiovascular:     Rate and Rhythm: Normal rate.  Pulmonary:     Effort: Pulmonary effort is normal.  Skin:    General: Skin is warm and dry.     Findings: No erythema or rash.  Neurological:     Mental Status: He is alert and oriented to person, place, and time.  Psychiatric:        Mood and Affect: Mood normal.        Behavior: Behavior normal.        Thought Content: Thought content normal.     Comments: Well groomed, good eye contact, normal speech and thoughts     Results for orders placed  or performed during the hospital encounter of 05/15/23  Surgical pathology   Collection Time: 05/15/23 12:00 AM  Result Value Ref Range   SURGICAL PATHOLOGY      SURGICAL PATHOLOGY [facility] [address], [address] [place], [place] [address] Telephone [phone] or [phone] Fax [phone]  REPORT OF SURGICAL PATHOLOGY   Accession #: [id number] Patient Name: Jose Garcia, Jose Garcia Visit # : [id number]  MRN: [id number] Physician: Midge Minium DOB/Age [birth year] (Age: 36) Gender: M Collected Date: 05/15/2023 Received Date: 05/15/2023  FINAL DIAGNOSIS       1. Transverse Colon Polyp, Cold snare x4 :       - TUBULAR ADENOMA (MULTIPLE FRAGMENTS).      - SESSILE SERRATED POLYP (MULTIPLE FRAGMENTS).      - NEGATIVE FOR HIGH-GRADE DYSPLASIA AND MALIGNANCY.       2. Descending Colon Polyp, Cold snare x2 :       - TUBULAR ADENOMA (MULTIPLE FRAGMENTS).      - NEGATIVE FOR HIGH-GRADE DYSPLASIA AND MALIGNANCY.       3. Sigmoid  Colon Polyp, Cold snare :       - TUBULAR ADENOMA.      - NEGATIVE FOR HIGH-GRADE DYSPLASIA AND MALIGNANCY.       ELECTRONIC SIGNATURE : Rubinas Md, Delice Bison , Sports administrator, [employer] nutritionist  MICROSCOPIC DESCRIPTION  CASE COMMENTS STAINS USED IN DIAGNOSIS: H&E H&E H&E    CLINICAL HISTORY  SPECIMEN(S) OBTAINED 1. Transverse Colon Polyp, Cold Snare X4 2. Descending Colon Polyp, Cold Snare X2 3. Sigmoid  Colon Polyp, Cold Snare  SPECIMEN COMMENTS: SPECIMEN CLINICAL INFORMATION: 1. Screening colonoscopy.Colon polyp    Gross Description 1. Received in formalin, labeled "cold snare polyp x4 transverse colon", is a 1.0 x 1.0 x 0.2 cm aggregate of soft, tan tissue fragments and food material submitted entirely in block 1A. 2. Received in formalin, labeled "cold snare polyp x2 descending colon", is a 1.2 x 1.0 x 0.3 cm aggregate of soft, tan tissue fragments and food material submitted entirely in block 2A. 3.  Received in formalin, labeled "cold snare polyp sigmoid colon", is  a 0.5 x 0.4 x 0.1 cm aggregate of multiple soft, tan tissue fragments submitted entirely in block 3A.Please note that the specimen is delicate and may not s urvive processing.      SMB      05/15/2023        Report signed out from the following location(s) . [facility] [address] [place], [place] [address] CLIA #: [id number]  [facility] [address] [place], [place] [address] CLIA #: [id number]       Assessment & Plan:   Problem List Items Addressed This Visit     OSA on CPAP - Primary      OSA on CPAP   Well controlled, chronic OSA on CPAP - Excellent 100% adherence to CPAP nightly, 8-9 hours per night current CPAp settings 8-12 cm H2O - Continue current CPAP therapy, patient seems to be benefiting from therapy   No orders of the defined types were placed in this encounter.   No orders of the defined types were placed in this encounter.   Follow up plan: Return if symptoms worsen or fail to improve.   Saralyn Pilar, DO [facility] 06/20/2023, 2:50 PM

## 2023-06-20 NOTE — Patient Instructions (Signed)
Medication Instructions:  Your physician has recommended you make the following change in your medication:  1) STOP taking Eliquis 2) STOP taking Ranexa 3) START taking Aspirin 81 mg daily   *If you need a refill on your cardiac medications before your next appointment, please call your pharmacy*  Follow-Up: At [facility], you and your health needs are our priority.  As part of our continuing mission to provide you with exceptional heart care, we have created designated Provider Care Teams.  These Care Teams include your primary Cardiologist (physician) and Advanced Practice Providers (APPs -  Physician Assistants and Nurse Practitioners) who all work together to provide you with the care you need, when you need it.  Your next appointment:   1 year  Provider:   Sherie Don, NP

## 2023-07-11 ENCOUNTER — Other Ambulatory Visit: Payer: Self-pay | Admitting: Internal Medicine

## 2023-07-15 ENCOUNTER — Other Ambulatory Visit: Payer: Self-pay | Admitting: Internal Medicine

## 2023-09-04 ENCOUNTER — Ambulatory Visit: Payer: BC Managed Care – PPO | Admitting: Urology

## 2023-09-04 ENCOUNTER — Ambulatory Visit
Admission: RE | Admit: 2023-09-04 | Discharge: 2023-09-04 | Disposition: A | Discharge status: Home / Self Care | Attending: Urology | Admitting: Urology

## 2023-09-04 ENCOUNTER — Ambulatory Visit
Admission: RE | Admit: 2023-09-04 | Discharge: 2023-09-04 | Disposition: A | Discharge status: Home / Self Care | Source: Ambulatory Visit | Attending: Urology

## 2023-09-04 VITALS — BP 157/76 | HR 59 | Ht 72.0 in | Wt 280.8 lb

## 2023-09-04 DIAGNOSIS — N200 Calculus of kidney: Secondary | ICD-10-CM

## 2023-09-04 DIAGNOSIS — Z125 Encounter for screening for malignant neoplasm of prostate: Secondary | ICD-10-CM

## 2023-09-04 MED ORDER — ALLOPURINOL 300 MG PO TABS: 300.0000 mg | ORAL_TABLET | Freq: Every day | ORAL | 11 refills | Status: AC

## 2023-09-04 NOTE — Addendum Note (Signed)
 Addended by: Frankey Shown on: 09/04/2023 10:04 AM   Modules accepted: Orders

## 2023-09-04 NOTE — Progress Notes (Signed)
   09/04/2023 9:58 AM   Jose Garcia 01/01/1961 [id number]  Reason for visit: Follow up nephrolithiasis, PSA screening  HPI: 63 year old male who I originally saw in July 2024 for recurrent nephrolithiasis.  He has had numerous stones since age 41, but this had increased over the last few years to at least 5 stones per year, stone analysis showed 100% calcium oxalate monohydrate.  He has never required surgery for stones.  24-hour urine metabolic workup notable for good urine volume of 2.8 L, normal urine calcium 185, elevated urine oxalate 53, low urine citrate 233, urine pH 5.6, elevated uric acid 1235, elevated urine sodium 278.  CT scan 01/26/2023 for evaluation of stone burden showed a small nonobstructive left renal stone.  We started allopurinol in September 2024 based on his elevated urine uric acid and calcium oxalate stones with normal urine calcium, and recommended decreasing sodium in the diet.  He has done very well since that time and denies any stone episodes.  I personally viewed and interpreted his KUB today that shows a stable small left renal stone from prior CT.  I had recommended a repeat 24-hour urine metabolic workup after starting allopurinol, but this was not completed.  We also reviewed his normal PSA value of 0.57 from September 2024 which has been stable over the last few years.  Guidelines reviewed regarding screening every 2 to 4 years through age 64.  Reassurance provided regarding normal value  Continue allopurinol 300 mg daily for stone prevention Can continue PSA screening through PCP every 2 to 4 years through age 26 RTC 1 year KUB prior    Sondra Come, MD  [facility] [address] [place], [place] [address] [phone]

## 2023-11-27 ENCOUNTER — Other Ambulatory Visit: Payer: Self-pay | Admitting: Cardiology

## 2024-02-07 ENCOUNTER — Encounter: Payer: Self-pay | Admitting: Urology

## 2024-02-27 ENCOUNTER — Other Ambulatory Visit: Payer: Self-pay | Admitting: Cardiology

## 2024-03-18 ENCOUNTER — Other Ambulatory Visit: Payer: Self-pay | Admitting: Family Medicine

## 2024-03-18 DIAGNOSIS — Z125 Encounter for screening for malignant neoplasm of prostate: Secondary | ICD-10-CM

## 2024-03-18 DIAGNOSIS — I10 Essential (primary) hypertension: Secondary | ICD-10-CM

## 2024-03-18 DIAGNOSIS — R7309 Other abnormal glucose: Secondary | ICD-10-CM

## 2024-03-18 DIAGNOSIS — E7800 Pure hypercholesterolemia, unspecified: Secondary | ICD-10-CM

## 2024-03-18 DIAGNOSIS — Z0000 Encounter for general adult medical examination without abnormal findings: Secondary | ICD-10-CM

## 2024-03-18 DIAGNOSIS — G4733 Obstructive sleep apnea (adult) (pediatric): Secondary | ICD-10-CM

## 2024-03-19 ENCOUNTER — Other Ambulatory Visit: Payer: Self-pay

## 2024-03-20 LAB — TSH: TSH: 1.85 m[IU]/L (ref 0.40–4.50)

## 2024-03-20 LAB — CBC WITH DIFFERENTIAL/PLATELET
Absolute Lymphocytes: 781 {cells}/uL — ABNORMAL LOW (ref 850–3900)
Absolute Monocytes: 381 {cells}/uL (ref 200–950)
Basophils Absolute: 0 {cells}/uL (ref 0–200)
Basophils Relative: 0 %
Eosinophils Absolute: 52 {cells}/uL (ref 15–500)
Eosinophils Relative: 1.4 %
HCT: 42.3 % (ref 38.5–50.0)
Hemoglobin: 14.4 g/dL (ref 13.2–17.1)
MCH: 32.5 pg (ref 27.0–33.0)
MCHC: 34 g/dL (ref 32.0–36.0)
MCV: 95.5 fL (ref 80.0–100.0)
MPV: 11.7 fL (ref 7.5–12.5)
Monocytes Relative: 10.3 %
Neutro Abs: 2486 {cells}/uL (ref 1500–7800)
Neutrophils Relative %: 67.2 %
Platelets: 146 Thousand/uL (ref 140–400)
RBC: 4.43 Million/uL (ref 4.20–5.80)
RDW: 12 % (ref 11.0–15.0)
Total Lymphocyte: 21.1 %
WBC: 3.7 Thousand/uL — ABNORMAL LOW (ref 3.8–10.8)

## 2024-03-20 LAB — LIPID PANEL
Cholesterol: 160 mg/dL (ref <200)
HDL: 50 mg/dL (ref >=40)
LDL Cholesterol (Calc): 97 mg/dL
Non-HDL Cholesterol (Calc): 110 mg/dL (ref <130)
Total CHOL/HDL Ratio: 3.2 (calc) (ref <5.0)
Triglycerides: 51 mg/dL (ref <150)

## 2024-03-20 LAB — COMPREHENSIVE METABOLIC PANEL WITH GFR
AG Ratio: 1.9 (calc) (ref 1.0–2.5)
ALT: 18 U/L (ref 9–46)
AST: 15 U/L (ref 10–35)
Albumin: 4.3 g/dL (ref 3.6–5.1)
Alkaline phosphatase (APISO): 48 U/L (ref 35–144)
BUN: 15 mg/dL (ref 7–25)
CO2: 30 mmol/L (ref 20–32)
Calcium: 9 mg/dL (ref 8.6–10.3)
Chloride: 105 mmol/L (ref 98–110)
Creat: 0.73 mg/dL (ref 0.70–1.35)
Globulin: 2.3 g/dL (ref 1.9–3.7)
Glucose, Bld: 103 mg/dL — ABNORMAL HIGH (ref 65–99)
Potassium: 4.1 mmol/L (ref 3.5–5.3)
Sodium: 142 mmol/L (ref 135–146)
Total Bilirubin: 0.6 mg/dL (ref 0.2–1.2)
Total Protein: 6.6 g/dL (ref 6.1–8.1)
eGFR: 103 mL/min/1.73m2 (ref >=60)

## 2024-03-20 LAB — HEMOGLOBIN A1C
Hgb A1c MFr Bld: 5.3 % (ref <5.7)
Mean Plasma Glucose: 105 mg/dL
eAG (mmol/L): 5.8 mmol/L

## 2024-03-20 LAB — PSA: PSA: 0.74 ng/mL (ref <=4.00)

## 2024-03-26 ENCOUNTER — Ambulatory Visit (INDEPENDENT_AMBULATORY_CARE_PROVIDER_SITE_OTHER): Payer: Self-pay | Admitting: Family Medicine

## 2024-03-26 ENCOUNTER — Encounter: Payer: Self-pay | Admitting: Family Medicine

## 2024-03-26 VITALS — BP 132/60 | HR 87 | Ht 72.0 in | Wt 274.4 lb

## 2024-03-26 DIAGNOSIS — G4733 Obstructive sleep apnea (adult) (pediatric): Secondary | ICD-10-CM | POA: Diagnosis not present

## 2024-03-26 DIAGNOSIS — Z0000 Encounter for general adult medical examination without abnormal findings: Secondary | ICD-10-CM

## 2024-03-26 DIAGNOSIS — I10 Essential (primary) hypertension: Secondary | ICD-10-CM | POA: Diagnosis not present

## 2024-03-26 DIAGNOSIS — E7800 Pure hypercholesterolemia, unspecified: Secondary | ICD-10-CM | POA: Diagnosis not present

## 2024-03-26 DIAGNOSIS — I480 Paroxysmal atrial fibrillation: Secondary | ICD-10-CM

## 2024-03-26 NOTE — Patient Instructions (Addendum)
 Thank you for coming to the office today.  Ask about Prevnar-20 pneumonia vaccine, can get it here or at the pharmacy, 1 dose before 65, and it can last 5-10 years.  Continue current therapy No changes today  Continue use of CPAP  Please schedule a Follow-up Appointment to: Return for 1 year fasting lab > 1 week later Annual Physical.  If you have any other questions or concerns, please feel free to call the office or send a message through MyChart. You may also schedule an earlier appointment if necessary.  Additionally, you may be receiving a survey about your experience at our office within a few days to 1 week by e-mail or mail. We value your feedback.  Marsa Officer, DO [facility], [place]

## 2024-03-26 NOTE — Progress Notes (Signed)
 Subjective:    Patient ID: Jose Garcia, male    DOB: 20-Nov-1960, 63 y.o.   MRN: [id number]  Jose Garcia is a 63 y.o. male presenting on 03/26/2024 for Annual Exam   HPI  Discussed the use of AI scribe software for clinical note transcription with the patient, who gave verbal consent to proceed.  History of Present Illness   Jose Garcia is a 63 year old male who presents for an annual physical exam.  Preventive health maintenance - Considering Prevnar 20 pneumonia vaccine - Opted not to receive COVID boosters at this time - Declined influenza vaccination today - Underwent colon cancer screening last fall with positive Cologuard test, followed by colonoscopy with polyp removal  Metabolic and laboratory findings - Recent PSA level 0.74 - Hemoglobin A1c 5.3 - LDL cholesterol decreased to 97 (previously 104 and 120s) - Weight decreased from 280 to 274 pounds - Recent blood counts, thyroid function, and chemistry panel including kidney and liver function checked  - Uses furosemide  (Lasix ) three to four days per week to manage fluid retention, especially on hot and humid days  Allergic rhinitis - Uses Flonase  and Claritin for allergy symptoms      Paroxysmal Atrial Fibrillation Followed by Dr Fernande [facility] [place] + EP - Currently reports that his AFib is well controlled. Currently not experiencing any arrhythmia CHA2DS2-VASc score was 1 previously, discontinued Eliquis  and Ranexa  - On Aspirin  81mg  No significant known CAD. He had prior CT Cardiac score 0 negative He is doing well currently without symptoms or problem.   CHRONIC HTN: Reports home BP controlled Side effect of effect with Benazepril , he has issue with this throat inflammation then thought due to esophagus Trial on Spiro then came off Current Meds - Diltiazem , 120mg  daily Losartan  100mg , HCTZ 25mg  daily Reports good compliance, took meds today. Tolerating well, w/o complaints. Denies CP,  dyspnea, HA, edema, dizziness / lightheadedness   HYPERLIPIDEMIA: He follows with Cardiologist. They deferred statin last time as well Last lipid panel 2025 LDL down from 104 to 97 Improving weight loss with lifestyle down 8 lbs Not on statin therapy   WBC Mild low result. 3.7 slight improved, prior normal 5 to 6 range Asymptomatic    OSA, on CPAP He has completed recent Split Night CPAP Titration with [facility] - Today reports that sleep apnea is well controlled. Uses the CPAP machine every night. Tolerates the machine well, and thinks that sleeps better with it and feels good. No new concerns or symptoms.   History of Nephrolithiasis  Followed by Urology He does improve his diet and preventing kidney stones, takes in Citrates / [employer] On Allopurinol  300mg  No kidney stones in past year. Med is working well Now much improved. Has Tamsulosin ONLY if needed.      Health Maintenance:   Colon CA Screening: Last Cologuard positive 2024, had Colonoscopy 05/15/23 polyps Dr Jinny AGI repeat due in 3 years 2027   Prostate CA Screening: Prior PSA 0.74 (2025). Prior range 0.5 to 0.6. Currently asymptomatic.  No known family history of prostate CA.   Considering Prevnar-20   Declines Shingles, COVID Flu vaccines today     03/26/2024    8:07 AM 06/20/2023    2:40 PM 03/20/2023    1:19 PM  Depression screen PHQ 2/9  Decreased Interest 0 0 0  Down, Depressed, Hopeless 0 0 0  PHQ - 2 Score 0 0 0  Altered sleeping 0  0  Tired, decreased energy 0  0  Change in appetite 0  0  Feeling bad or failure about yourself  0  0  Trouble concentrating 0  0  Moving slowly or fidgety/restless 0  0  Suicidal thoughts 0  0  PHQ-9 Score 0  0  Difficult doing work/chores   Not difficult at all       03/26/2024    8:07 AM 06/20/2023    2:40 PM 03/20/2023    1:19 PM 02/15/2023   10:32 AM  GAD 7 : Generalized Anxiety Score  Nervous, Anxious, on Edge 0 0 0 0  Control/stop worrying 0  0 0 0  Worry too much - different things 0 0 0 0  Trouble relaxing 0 0 0 0  Restless 0 0 0 0  Easily annoyed or irritable 0 0 0 0  Afraid - awful might happen 0 0 0 0  Total GAD 7 Score 0 0 0 0  Anxiety Difficulty   Not difficult at all Not difficult at all     Past Medical History:  Diagnosis Date   Atrial fibrillation (HCC)    Cardiomyopathy    Resolved 05/03/09   Hyperlipidemia    Hypertension 03/03/1996   Kidney stone    Sleep apnea    Past Surgical History:  Procedure Laterality Date   ATRIAL FIBRILLATION ABLATION N/A 09/04/2022   Procedure: ATRIAL FIBRILLATION ABLATION;  Surgeon: Cindie Ole DASEN, MD;  Location: [facility];  Service: Cardiovascular;  Laterality: N/A;   CARDIOVERSION     COLONOSCOPY     COLONOSCOPY WITH PROPOFOL  N/A 05/15/2023   Procedure: COLONOSCOPY WITH PROPOFOL ;  Surgeon: Jinny Carmine, MD;  Location: [facility] ENDOSCOPY;  Service: Endoscopy;  Laterality: N/A;   POLYPECTOMY  05/15/2023   Procedure: POLYPECTOMY;  Surgeon: Jinny Carmine, MD;  Location: [facility] ENDOSCOPY;  Service: Endoscopy;;   Social History   Socioeconomic History   Marital status: Married    Spouse name: Not on file   Number of children: 2   Years of education: Not on file   Highest education level: Some college, no degree  Occupational History   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   Social Drivers of Corporate investment banker Strain: Low Risk  (03/22/2024)    Overall Financial Resource Strain (CARDIA)    Difficulty of Paying Living Expenses: Not hard at all  Food Insecurity: No Food Insecurity (03/22/2024)   Hunger Vital Sign    Worried About Running Out of Food in the Last Year: Never true    Ran Out of Food in the Last Year: Never true  Transportation Needs: No Transportation Needs (03/22/2024)   PRAPARE - Administrator, Civil Service (Medical): No    Lack of Transportation (Non-Medical): No  Physical Activity: Insufficiently Active (03/22/2024)   Exercise Vital Sign  Days of Exercise per Week: 2 days    Minutes of Exercise per Session: 20 min  Stress: No Stress Concern Present (03/22/2024)   [employer] of Occupational Health - Occupational Stress Questionnaire    Feeling of Stress: Not at all  Social Connections: Moderately Integrated (03/22/2024)   Social Connection and Isolation Panel    Frequency of Communication with Friends and Family: More than three times a week    Frequency of Social Gatherings with Friends and Family: Twice a week    Attends Religious Services: More than 4 times per year    Active Member of [employer] or Organizations: No    Attends Engineer, structural: Not on file    Marital Status: Married  Catering manager Violence: Not on file   Family History  Problem Relation Age of Onset   Hypertension Father    Hypertension Mother    Heart disease Mother    Cancer Mother    Diabetes Neg Hx    Coronary artery disease Neg Hx    Colon cancer Neg Hx    Prostate cancer Neg Hx    Current Outpatient Medications on File Prior to Visit  Medication Sig   allopurinol  (ZYLOPRIM ) 300 MG tablet Take 1 tablet (300 mg total) by mouth daily.   aspirin  EC 81 MG tablet Take 1 tablet (81 mg total) by mouth daily. Swallow whole.   diltiazem  (CARDIZEM  CD) 120 MG 24 hr capsule TAKE 1 CAPSULE BY MOUTH DAILY   fluticasone  (FLONASE ) 50 MCG/ACT nasal spray Place 2 sprays into both nostrils daily. Use for 4-6  weeks then stop and use seasonally or as needed.   furosemide  (LASIX ) 20 MG tablet TAKE 1 TABLET(20 MG) BY MOUTH DAILY AS NEEDED   hydrochlorothiazide  (HYDRODIURIL ) 25 MG tablet TAKE 1 TABLET(25 MG) BY MOUTH DAILY   loratadine (CLARITIN) 10 MG tablet Take 10 mg by mouth daily.   losartan  (COZAAR ) 100 MG tablet Take 1 tablet (100 mg total) by mouth daily.   tamsulosin (FLOMAX) 0.4 MG CAPS capsule Take 0.4 mg by mouth as needed (when passing stone).   No current facility-administered medications on file prior to visit.    Review of Systems  Constitutional:  Negative for activity change, appetite change, chills, diaphoresis, fatigue and fever.  HENT:  Negative for congestion and hearing loss.   Eyes:  Negative for visual disturbance.  Respiratory:  Negative for cough, chest tightness, shortness of breath and wheezing.   Cardiovascular:  Negative for chest pain, palpitations and leg swelling.  Gastrointestinal:  Negative for abdominal pain, constipation, diarrhea, nausea and vomiting.  Genitourinary:  Negative for dysuria, frequency and hematuria.  Musculoskeletal:  Negative for arthralgias and neck pain.  Skin:  Negative for rash.  Neurological:  Negative for dizziness, weakness, light-headedness, numbness and headaches.  Hematological:  Negative for adenopathy.  Psychiatric/Behavioral:  Negative for behavioral problems, dysphoric mood and sleep disturbance.    Per HPI unless specifically indicated above     Objective:    BP 132/60 (BP Location: Right Arm, Patient Position: Sitting, Cuff Size: Large)   Pulse 87   Ht 6' (1.829 m)   Wt 274 lb 6 oz (124.5 kg)   SpO2 95%   BMI 37.21 kg/m   Wt Readings from Last 3 Encounters:  03/26/24 274 lb 6 oz (124.5 kg)  09/04/23 280 lb 12.8 oz (127.4 kg)  06/20/23 273 lb (123.8 kg)    Physical Exam Vitals and nursing note reviewed.  Constitutional:  General: He is not in acute distress.    Appearance: He is well-developed. He is not  diaphoretic.     Comments: Well-appearing, comfortable, cooperative  HENT:     Head: Normocephalic and atraumatic.  Eyes:     General:        Right eye: No discharge.        Left eye: No discharge.     Conjunctiva/sclera: Conjunctivae normal.     Pupils: Pupils are equal, round, and reactive to light.  Neck:     Thyroid: No thyromegaly.     Vascular: No carotid bruit.  Cardiovascular:     Rate and Rhythm: Normal rate and regular rhythm.     Pulses: Normal pulses.     Heart sounds: Normal heart sounds. No murmur heard. Pulmonary:     Effort: Pulmonary effort is normal. No respiratory distress.     Breath sounds: Normal breath sounds. No wheezing or rales.  Abdominal:     General: Bowel sounds are normal. There is no distension.     Palpations: Abdomen is soft. There is no mass.     Tenderness: There is no abdominal tenderness.  Musculoskeletal:        General: No tenderness. Normal range of motion.     Cervical back: Normal range of motion and neck supple.     Comments: Upper / Lower Extremities: - Normal muscle tone, strength bilateral upper extremities 5/5, lower extremities 5/5  Lymphadenopathy:     Cervical: No cervical adenopathy.  Skin:    General: Skin is warm and dry.     Findings: No erythema or rash.  Neurological:     Mental Status: He is alert and oriented to person, place, and time.     Comments: Distal sensation intact to light touch all extremities  Psychiatric:        Mood and Affect: Mood normal.        Behavior: Behavior normal.        Thought Content: Thought content normal.     Comments: Well groomed, good eye contact, normal speech and thoughts     I have personally reviewed the radiology report from 08/28/22 CT Cardiac Scan.  ADDENDUM REPORT: 08/30/2022 10:16   EXAM: OVER-READ INTERPRETATION  CT CHEST   The following report is an over-read performed by radiologist Dr. Juliene Cramp [facility] on 08/30/2022. This over-read does not  include interpretation of cardiac or coronary anatomy or pathology. The coronary CTA interpretation by the cardiologist is attached.   COMPARISON:  09/26/2017   FINDINGS: Visualized mediastinal structures are normal. Question a low-density structure measuring 1.3 cm at the left hepatic dome on image 63/10 and this is likely an incidental finding. No airspace disease or consolidation in the visualized lungs. Bridging osteophytes in thoracic spine. No acute bone abnormality.   IMPRESSION: No acute extracardiac findings.     Electronically Signed   By: Juliene Balder M.D.   On: 08/30/2022 10:16    Addended by Balder Juliene, MD on 08/30/2022 10:18 AM    Study Result  Narrative & Impression  CLINICAL DATA:  Atrial fibrillation scheduled for ablation.   EXAM: Cardiac CTA   TECHNIQUE: A non-contrast, gated CT scan was obtained with axial slices of 3 mm through the heart for calcium scoring. Calcium scoring was performed using the Agatston method. A 120 kV retrospective, gated, contrast cardiac scan was obtained. Gantry rotation speed was 250 msecs and collimation was 0.6 mm. Nitroglycerin was not given. A  delayed scan was obtained 60 seconds after contrast injection to exclude left atrial appendage thrombus. The 3D dataset was reconstructed in 5% intervals of the 0-95% of the R-R cycle. Systolic and diastolic phases were analyzed on a dedicated workstation using MPR, MIP, and VRT modes. The patient received 80 cc of contrast.   FINDINGS: Image quality: Excellent   Noise artifact is: Limited.   Pulmonary Veins: There is normal pulmonary vein drainage into the left atrium (2 on the right (including a middle right PV) and 2 on the left) with ostial measurements as follows:   RUPV: Ostium 25 x 21 mm  area 3.9 cm2   RLPV:  Ostium 28 x 22 mm  area 4.6 cm2   LUPV:  Ostium 18 x 15 mm area 1.8 cm2   LLPV:  Ostium 23 x 19 mm  area 3.3 cm2   Left Atrium: The left atrial size is  normal. There is no a small PFO. There is no thrombus in the left atrial appendage on contrast or delayed imaging. The esophagus runs in the left atrial midline and is not in proximity to any of the pulmonary vein ostia.   Coronary Arteries: CAC score of 0. Normal coronary origin. Left dominance. The study was performed without use of NTG and is insufficient for plaque evaluation.   Right Atrium: Right atrial size is within normal limits.   Right Ventricle: The right ventricular cavity is within normal limits.   Left Ventricle: The ventricular cavity size is within normal limits. There are no stigmata of prior infarction. There is no abnormal filling defect.   Pericardium: Normal thickness with no significant effusion or calcium present.   Pulmonary Artery: Normal caliber without proximal filling defect.   Cardiac valves: The aortic valve is trileaflet without significant calcification. The mitral valve is normal structure without significant calcification.   Aorta: Normal caliber with no significant disease.   Extra-cardiac findings: See attached radiology report for non-cardiac structures.   IMPRESSION: 1. There is normal pulmonary vein drainage into the left atrium with ostial measurements above.   2. There is no thrombus in the left atrial appendage.   3. The esophagus runs in the left atrial midline and is not in proximity to any of the pulmonary vein ostia.   4. Small PFO.   5. Normal coronary origin. Left dominance.   6. CAC score of 0.   Oneil Parchment, MD   Electronically Signed: By: Oneil Parchment M.D. On: 08/28/2022 17:02     Results for orders placed or performed in visit on 03/18/24  Lipid panel   Collection Time: 03/19/24  7:57 AM  Result Value Ref Range   Cholesterol 160 <200 mg/dL   HDL 50 > OR = 40 mg/dL   Triglycerides 51 <849 mg/dL   LDL Cholesterol (Calc) 97 mg/dL (calc)   Total CHOL/HDL Ratio 3.2 <5.0 (calc)   Non-HDL Cholesterol (Calc)  110 <130 mg/dL (calc)  Hemoglobin J8r   Collection Time: 03/19/24  7:57 AM  Result Value Ref Range   Hgb A1c MFr Bld 5.3 <5.7 %   Mean Plasma Glucose 105 mg/dL   eAG (mmol/L) 5.8 mmol/L  CBC with Differential/Platelet   Collection Time: 03/19/24  7:57 AM  Result Value Ref Range   WBC 3.7 (L) 3.8 - 10.8 Thousand/uL   RBC 4.43 4.20 - 5.80 Million/uL   Hemoglobin 14.4 13.2 - 17.1 g/dL   HCT 57.6 61.4 - 49.9 %   MCV 95.5 80.0 - 100.0 fL  MCH 32.5 27.0 - 33.0 pg   MCHC 34.0 32.0 - 36.0 g/dL   RDW 87.9 88.9 - 84.9 %   Platelets 146 140 - 400 Thousand/uL   MPV 11.7 7.5 - 12.5 fL   Neutro Abs 2,486 1,500 - 7,800 cells/uL   Absolute Lymphocytes 781 (L) 850 - 3,900 cells/uL   Absolute Monocytes 381 200 - 950 cells/uL   Eosinophils Absolute 52 15 - 500 cells/uL   Basophils Absolute 0 0 - 200 cells/uL   Neutrophils Relative % 67.2 %   Total Lymphocyte 21.1 %   Monocytes Relative 10.3 %   Eosinophils Relative 1.4 %   Basophils Relative 0.0 %  PSA   Collection Time: 03/19/24  7:57 AM  Result Value Ref Range   PSA 0.74 < OR = 4.00 ng/mL  TSH   Collection Time: 03/19/24  7:57 AM  Result Value Ref Range   TSH 1.85 0.40 - 4.50 mIU/L  Comprehensive metabolic panel with GFR   Collection Time: 03/19/24  7:57 AM  Result Value Ref Range   Glucose, Bld 103 (H) 65 - 99 mg/dL   BUN 15 7 - 25 mg/dL   Creat 9.26 9.29 - 8.64 mg/dL   eGFR 896 > OR = 60 fO/fpw/8.26f7   BUN/Creatinine Ratio SEE NOTE: 6 - 22 (calc)   Sodium 142 135 - 146 mmol/L   Potassium 4.1 3.5 - 5.3 mmol/L   Chloride 105 98 - 110 mmol/L   CO2 30 20 - 32 mmol/L   Calcium 9.0 8.6 - 10.3 mg/dL   Total Protein 6.6 6.1 - 8.1 g/dL   Albumin 4.3 3.6 - 5.1 g/dL   Globulin 2.3 1.9 - 3.7 g/dL (calc)   AG Ratio 1.9 1.0 - 2.5 (calc)   Total Bilirubin 0.6 0.2 - 1.2 mg/dL   Alkaline phosphatase (APISO) 48 35 - 144 U/L   AST 15 10 - 35 U/L   ALT 18 9 - 46 U/L      Assessment & Plan:   Problem List Items Addressed This Visit      Essential hypertension   HYPERCHOLESTEROLEMIA   Morbid obesity (HCC)   OSA on CPAP   Paroxysmal atrial fibrillation (HCC)   Other Visit Diagnoses       Annual physical exam    -  Primary        Updated Health Maintenance information Reviewed recent lab results with patient Encouraged improvement to lifestyle with diet and exercise Goal of weight loss   Adult Wellness Visit Annual wellness visit with normal lab results. Blood pressure 132/60. Weight decreased from 280 to 274 lbs. No new health concerns. - Schedule annual wellness visit for next year, Monday through Wednesday, earliest AM preferred. - Provide MyChart access for lab results.  Hypertension Hypertension managed with hydrochlorothiazide  and diltiazem . Blood pressure well-controlled at 132/60.  Hyperlipidemia Hyperlipidemia managed through diet and lifestyle changes. LDL cholesterol decreased to 97 without medication.  Obstructive sleep apnea Obstructive sleep apnea managed with CPAP. Self-adjusted settings for comfort and reports effective use.  Nephrolithiasis Nephrolithiasis managed with allopurinol  300 mg. No recent kidney stones reported in the past year. - Tolcylen available for use if needed to flush out stones.  Edema, intermittent lower extremity Intermittent lower extremity edema worsens with hot and humid weather. Managed with furosemide  (Lasix ) 3-4 days a week when not traveling.  Allergic rhinitis Allergic rhinitis managed with over-the-counter Flonase  and Claritin.  Colonic polyps, status post removal Colonic polyps removed following a positive Cologuard test  in 2024. Follow-up colonoscopy recommended in 2027. - Schedule follow-up colonoscopy in 2027.  General Health Maintenance Discussed vaccinations including flu shot, shingles, and pneumonia. Declined flu shot and COVID booster. Considering Prevnar 20 for pneumonia, recommended for those 50 and older. One dose can last 5-10 years. - Consider  Prevnar 20 vaccination for pneumonia, available at pharmacy or clinic.         No orders of the defined types were placed in this encounter.   No orders of the defined types were placed in this encounter.    Follow up plan: Return for 1 year fasting lab > 1 week later Annual Physical.  Marsa Officer, DO [facility] 03/26/2024, 8:33 AM

## 2024-05-22 ENCOUNTER — Other Ambulatory Visit: Payer: Self-pay | Admitting: Cardiology

## 2024-06-25 ENCOUNTER — Other Ambulatory Visit: Payer: Self-pay | Admitting: Cardiology

## 2024-07-30 ENCOUNTER — Other Ambulatory Visit: Payer: Self-pay | Admitting: Student

## 2024-07-31 ENCOUNTER — Other Ambulatory Visit: Payer: Self-pay | Admitting: Student

## 2024-08-05 MED ORDER — FUROSEMIDE 20 MG PO TABS: 20.0000 mg | ORAL_TABLET | Freq: Every day | ORAL | 0 refills | Status: AC | PRN

## 2024-08-05 NOTE — Telephone Encounter (Signed)
 Magnesium done on 02/06/23. Can do at next F/U

## 2024-09-02 ENCOUNTER — Ambulatory Visit: Admitting: Urology

## 2024-09-03 ENCOUNTER — Ambulatory Visit: Admitting: Urology

## 2024-09-04 ENCOUNTER — Ambulatory Visit: Admitting: Urology

## 2025-03-25 ENCOUNTER — Other Ambulatory Visit

## 2025-03-26 ENCOUNTER — Other Ambulatory Visit

## 2025-04-01 ENCOUNTER — Encounter: Admitting: Family Medicine
